# Patient Record
Sex: Male | Born: 1935 | Race: White | Hispanic: No | Marital: Married | Smoking: Former smoker
Health system: Southern US, Community
[De-identification: ages and names within clinical notes are randomized; demographics above are authoritative.]

## PROBLEM LIST (undated history)

## (undated) DIAGNOSIS — I739 Peripheral vascular disease, unspecified: Secondary | ICD-10-CM

## (undated) DIAGNOSIS — D649 Anemia, unspecified: Secondary | ICD-10-CM

## (undated) DIAGNOSIS — N400 Enlarged prostate without lower urinary tract symptoms: Secondary | ICD-10-CM

## (undated) DIAGNOSIS — C3490 Malignant neoplasm of unspecified part of unspecified bronchus or lung: Secondary | ICD-10-CM

## (undated) DIAGNOSIS — C801 Malignant (primary) neoplasm, unspecified: Secondary | ICD-10-CM

## (undated) DIAGNOSIS — I639 Cerebral infarction, unspecified: Secondary | ICD-10-CM

## (undated) DIAGNOSIS — K635 Polyp of colon: Secondary | ICD-10-CM

## (undated) DIAGNOSIS — K921 Melena: Secondary | ICD-10-CM

## (undated) DIAGNOSIS — I10 Essential (primary) hypertension: Secondary | ICD-10-CM

## (undated) DIAGNOSIS — L97509 Non-pressure chronic ulcer of other part of unspecified foot with unspecified severity: Secondary | ICD-10-CM

## (undated) DIAGNOSIS — M79671 Pain in right foot: Secondary | ICD-10-CM

## (undated) DIAGNOSIS — R195 Other fecal abnormalities: Secondary | ICD-10-CM

## (undated) DIAGNOSIS — I7025 Atherosclerosis of native arteries of other extremities with ulceration: Secondary | ICD-10-CM

## (undated) HISTORY — DX: Non-pressure chronic ulcer of other part of unspecified foot with unspecified severity: L97.509

## (undated) HISTORY — DX: Peripheral vascular disease, unspecified: I73.9

## (undated) HISTORY — DX: Malignant (primary) neoplasm, unspecified: C80.1

## (undated) HISTORY — DX: Melena: K92.1

## (undated) HISTORY — DX: Malignant neoplasm of unspecified part of unspecified bronchus or lung: C34.90

## (undated) HISTORY — DX: Atherosclerosis of native arteries of other extremities with ulceration: I70.25

## (undated) HISTORY — DX: Benign prostatic hyperplasia without lower urinary tract symptoms: N40.0

## (undated) HISTORY — DX: Pain in right foot: M79.671

## (undated) HISTORY — DX: Essential (primary) hypertension: I10

---
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
## 2000-03-07 ENCOUNTER — Ambulatory Visit (HOSPITAL_COMMUNITY): Admission: RE | Admit: 2000-03-07 | Discharge: 2000-03-07 | Payer: Self-pay | Admitting: Gastroenterology

## 2003-08-31 ENCOUNTER — Ambulatory Visit (HOSPITAL_COMMUNITY): Admission: RE | Admit: 2003-08-31 | Discharge: 2003-08-31 | Payer: Self-pay | Admitting: Gastroenterology

## 2006-12-16 ENCOUNTER — Ambulatory Visit: Payer: Self-pay | Admitting: Family Medicine

## 2006-12-30 ENCOUNTER — Ambulatory Visit: Payer: Self-pay | Admitting: Family Medicine

## 2014-03-24 DIAGNOSIS — N400 Enlarged prostate without lower urinary tract symptoms: Secondary | ICD-10-CM | POA: Insufficient documentation

## 2014-03-24 DIAGNOSIS — I10 Essential (primary) hypertension: Secondary | ICD-10-CM | POA: Insufficient documentation

## 2015-12-01 ENCOUNTER — Ambulatory Visit (INDEPENDENT_AMBULATORY_CARE_PROVIDER_SITE_OTHER): Payer: Commercial Managed Care - HMO | Admitting: Osteopathic Medicine

## 2015-12-01 ENCOUNTER — Encounter: Payer: Self-pay | Admitting: Osteopathic Medicine

## 2015-12-01 ENCOUNTER — Ambulatory Visit (INDEPENDENT_AMBULATORY_CARE_PROVIDER_SITE_OTHER): Payer: Commercial Managed Care - HMO

## 2015-12-01 VITALS — BP 131/52 | HR 51 | Ht 68.0 in | Wt 183.0 lb

## 2015-12-01 DIAGNOSIS — M79671 Pain in right foot: Secondary | ICD-10-CM

## 2015-12-01 NOTE — Patient Instructions (Signed)
Try to keep the foot elevated as much as possible to help with swelling and pain. Ok to take Tylenol, Aspirin or Ibuprofen for the pain if prescription medicines aren't helping you. We will work on scheduling an MRI to get a better picture of the foot, and further workup will be decided once we have more information from the MRI.   Please make an appointment to see Dr Sheppard Coil after the MRI so she can check your foot again and go over the MRI results and images with you. Please wear the protective boot as much as possible when you are walking.   If the foot becomes more swollen or severely painful, if you develop fever or weakness, or if the skin start to look worse, let a doctor know right away. It is unlikely that you have an infection at this time but keep a close watch on your skin for redness or drainage.

## 2015-12-01 NOTE — Addendum Note (Signed)
Addended by: Maryla Morrow on: 12/01/2015 04:51 PM   Modules accepted: Orders

## 2015-12-01 NOTE — Progress Notes (Signed)
HPI: Henry Ford is a 81 y.o. male who presents to [facility] today for chief complaint of:  Chief Complaint  Patient presents with  . Establish Care  . Foot Swelling    RIGHT    . Location: R foot . Quality: painful, soreness . Duration: few weeks . Context: was on a ladder and thinks he sprained it, didn't fall, he was reaching for something and felt a pull in the foot. Thinks may have been a puncture wound. ER XR pt states was normal. Thinks got tetanus shot. No records available.  . Assoc signs/symptoms: No history claudication. No fever/chills.    Past medical, social and family history reviewed: Past Medical History  Diagnosis Date  . Hypertension    No past surgical history on file. Social History  Substance Use Topics  . Smoking status: Current Every Day Smoker  . Smokeless tobacco: Not on file  . Alcohol Use: Not on file   No family history on file.  Current Outpatient Prescriptions  Medication Sig Dispense Refill  . finasteride (PROSCAR) 5 MG tablet Take 5 mg by mouth.    . losartan (COZAAR) 100 MG tablet Take 100 mg by mouth.    . terazosin (HYTRIN) 10 MG capsule Take 10 mg by mouth.     No current facility-administered medications for this visit.   No Known Allergies    Review of Systems: CONSTITUTIONAL:  No  fever, no chills, No  unintentional weight changes HEAD/EYES/EARS/NOSE/THROAT: No  headache, no vision change, no hearing change, No  sore throat, No  sinus pressure CARDIAC: No  chest pain, No  pressure, No palpitations, No  orthopnea RESPIRATORY: No  cough, No  shortness of breath/wheeze GASTROINTESTINAL: No  nausea, No  vomiting, No  abdominal pain, No  blood in stool, No  diarrhea, No  constipation  MUSCULOSKELETAL: (+) foot myalgia/arthralgia GENITOURINARY: No  incontinence, No  abnormal genital bleeding/discharge SKIN: No  rash/wounds/concerning lesions other than R foot HEM/ONC: No  easy bruising/bleeding,  No  abnormal lymph node ENDOCRINE: No polyuria/polydipsia/polyphagia, No  heat/cold intolerance  NEUROLOGIC: No  weakness, No  dizziness, No  slurred speech PSYCHIATRIC: No  concerns with depression, No  concerns with anxiety, No sleep problems  Exam:  BP 131/52 mmHg  Pulse 51  Ht '5\' 8"'$  (1.727 m)  Wt 183 lb (83.008 kg)  BMI 27.83 kg/m2 Constitutional: VS see above. General Appearance: alert, well-developed, well-nourished, NAD Respiratory: Normal respiratory effort. no wheeze, no rhonchi, no rales Cardiovascular: S1/S2 normal, no murmur, no rub/gallop auscultated. RRR.  Musculoskeletal: Gait antalgic. R foot (+)2-3 edema on foot, (+)1 up to just distal to knee. Foot (+)mild erythema, normal capillary refill, (+) onychomycosis, (+) contusion R great toe, monofilament exam normal, pulses difficult to palpate due to edema but skin feels normal temperature and sensatoin is intact, cap refill <2sec on R toe Skin: warm, dry, intact. No rash/ulcer. No concerning nevi or subq nodules on limited exam.  (+)crackin of skin d/t foot edema, no drainage/ulcer, no wound noted   No results found for this or any previous visit (from the past 72 hour(s)).  Records reviewed: 03/2014 CBC, CMP, TSH normal, no lipids checked   ASSESSMENT/PLAN: Advised will know more once have MRI, my concern is for possible osteomyelitis or occult fracture. Foot does not appear cellulitic at this time and I don't think risk/benefit profile of abx is work prescribing them at this time, pt advised po abx won't work on OM either. Pt  advised schedule appt with me after MRI to discuss results and further workup if needed. Pt given postop shoe. Advised elevate foot if he can tolerate doing this. Opiate pain meds weren't really helping, I advised he can continue these or go to NSAID if he'd rather. ER precautions reviewed or RTC sooner if needed. Note: pt was also seen briefly by Dr Georgina Snell sports med, whose assistance was appreciated. XR  reviewed with Dr. Georgina Snell and does not appear to be fracture, await official read. Get ER records.   Right foot pain - Plan: DG Foot Complete Right, MR Foot Right Wo Contrast   Return appointment after MRI so we can view results.

## 2015-12-02 ENCOUNTER — Telehealth: Payer: Self-pay

## 2015-12-02 DIAGNOSIS — N400 Enlarged prostate without lower urinary tract symptoms: Secondary | ICD-10-CM

## 2015-12-02 DIAGNOSIS — M79671 Pain in right foot: Secondary | ICD-10-CM

## 2015-12-02 NOTE — Telephone Encounter (Signed)
Patient request a referral for neurologist Dr. Irine Seal with [facility] in [facility] near [facility] for Prostate problems. He have seen Dr. Jeffie Pollock for over 10 years now. Also per  Bonnita Nasuti in imaging patient need to have BMET panel done for MRI due to patient being over 60 and getting IV contrast. Lab and referral has been place.Elysabeth Aust,CMA

## 2015-12-06 ENCOUNTER — Telehealth: Payer: Self-pay

## 2015-12-06 DIAGNOSIS — M25579 Pain in unspecified ankle and joints of unspecified foot: Secondary | ICD-10-CM

## 2015-12-06 NOTE — Telephone Encounter (Signed)
Patient spouse called stated that patient that his toe is still swollen and it keeps him up at night, he wants to know if he can get something for pain that may help him sleep. Please advise. Rhonda Cunningham,CMA

## 2015-12-06 NOTE — Telephone Encounter (Signed)
He should have Hydrocodone 10-325 mg pills, he can take these (or if he has Hydrocodone 5-325 he can take two of these pills) at least 1 hour before bedtime.

## 2015-12-07 MED ORDER — GABAPENTIN 300 MG PO CAPS: 300.0000 mg | ORAL_CAPSULE | Freq: Two times a day (BID) | ORAL | Status: DC

## 2015-12-07 NOTE — Telephone Encounter (Signed)
See encounter

## 2015-12-07 NOTE — Telephone Encounter (Signed)
Please call patient, if the hydrocodone isn't working, not much stronger we could give. I tried alternative medication called into pharmacy gabapentin, this may make him a bit sleepy so try taking at night. Please confirm that he is set up for MRI soon.

## 2015-12-07 NOTE — Telephone Encounter (Signed)
Patient has taken the hydrocodone that was given at [facility] and that does not work she stated. Henry Ford,CMA

## 2015-12-07 NOTE — Telephone Encounter (Signed)
Patient is informed and set to get blood work done 12/07/14 and at that point MRI will be scheduled. [facility],CMA

## 2015-12-09 LAB — BASIC METABOLIC PANEL
BUN: 23 mg/dL (ref 7–25)
CHLORIDE: 109 mmol/L (ref 98–110)
CO2: 28 mmol/L (ref 20–31)
Calcium: 8.7 mg/dL (ref 8.6–10.3)
Creat: 1.13 mg/dL (ref 0.70–1.18)
Glucose, Bld: 82 mg/dL (ref 65–99)
POTASSIUM: 4.5 mmol/L (ref 3.5–5.3)
Sodium: 142 mmol/L (ref 135–146)

## 2015-12-15 ENCOUNTER — Encounter: Payer: Self-pay | Admitting: Vascular Surgery

## 2015-12-15 ENCOUNTER — Other Ambulatory Visit: Payer: Self-pay

## 2015-12-15 DIAGNOSIS — I739 Peripheral vascular disease, unspecified: Secondary | ICD-10-CM

## 2015-12-19 ENCOUNTER — Telehealth: Payer: Self-pay | Admitting: Osteopathic Medicine

## 2015-12-19 NOTE — Telephone Encounter (Signed)
I received notification that the patient had been admitted to [facility], however I do not have complete records. Insurance is also asking [place] about referral for vein and vascular specialty. To complete the referral, I need to see this patient in the office for hospital follow-up visit, I need records from his hospital stay. Please ask him to schedule a hospital follow-up visit, if he is able to come in a few days prior to his visit to sign a records release so that we will have his records from his hospital stay, that would be ideal.

## 2015-12-19 NOTE — Telephone Encounter (Signed)
PATIENT INFORMED. Rhonda Cunningham,CMA

## 2015-12-20 ENCOUNTER — Ambulatory Visit (INDEPENDENT_AMBULATORY_CARE_PROVIDER_SITE_OTHER): Payer: Commercial Managed Care - HMO | Admitting: Osteopathic Medicine

## 2015-12-20 ENCOUNTER — Encounter: Payer: Self-pay | Admitting: Osteopathic Medicine

## 2015-12-20 VITALS — BP 128/55 | HR 65 | Wt 170.0 lb

## 2015-12-20 DIAGNOSIS — L03115 Cellulitis of right lower limb: Secondary | ICD-10-CM

## 2015-12-20 DIAGNOSIS — M79671 Pain in right foot: Secondary | ICD-10-CM | POA: Diagnosis not present

## 2015-12-20 NOTE — Progress Notes (Signed)
HPI: Henry Ford is a 81 y.o. male who presents to [facility] today for chief complaint of:  Chief Complaint  Patient presents with  . Hospitalization Follow-up     Was in the hospital 12/11/15 - 12/14/15, no discharge summary available at this time but I called [facility] and will fax request. Patient and his wife do bring their discharge instructions, but states that he did have CT of the foot and MRI of the foot however no results are available, there is no diagnosis on his discharge instructions for osteomyelitis but he was diagnosed with cellulitis and given antibiotics and pain medication. States this it was doing better in the hospital when he was keeping it consistently elevated however he has not been doing this is much at home, swelling has gotten a little bit worse, overall though no major difference from last visit. No fever or chills, no redness extending up the limb ominous sensation intact   Past medical, social and family history reviewed: Past Medical History  Diagnosis Date  . Hypertension   . Prostate enlargement    No past surgical history on file. Social History  Substance Use Topics  . Smoking status: Light Tobacco Smoker  . Smokeless tobacco: Never Used  . Alcohol Use: 0.6 oz/week    0 Standard drinks or equivalent, 1 Cans of beer per week   No family history on file.  Current Outpatient Prescriptions  Medication Sig Dispense Refill  . finasteride (PROSCAR) 5 MG tablet Take 5 mg by mouth.    . gabapentin (NEURONTIN) 300 MG capsule Take 1 capsule (300 mg total) by mouth 2 (two) times daily. 30 capsule 1  . HYDROcodone-acetaminophen (NORCO/VICODIN) 5-325 MG tablet Take 1 tablet by mouth every 6 (six) hours as needed for moderate pain.    Marland Kitchen losartan (COZAAR) 100 MG tablet Take 100 mg by mouth.    . sulfamethoxazole-trimethoprim (BACTRIM DS,SEPTRA DS) 800-160 MG tablet Take 1 tablet by mouth 2 (two) times daily.    Marland Kitchen  terazosin (HYTRIN) 10 MG capsule Take 10 mg by mouth.     No current facility-administered medications for this visit.   No Known Allergies    Review of Systems: CONSTITUTIONAL:  No  fever, no chills, No  unintentional weight changes CARDIAC: No  chest pain, No  pressure, No palpitations, No  orthopnea RESPIRATORY: No  cough, No  shortness of breath/wheeze GASTROINTESTINAL: No  nausea, No  vomiting, No  abdominal pain,  MUSCULOSKELETAL: (+) myalgia/arthralgia as per history of present illness SKIN: No  rash/wounds/concerning lesions other than right foot, swelling, HEM/ONC: No  easy bruising/bleeding, No  abnormal lymph node   Exam:  BP 128/55 mmHg  Pulse 65  Wt 170 lb (77.111 kg) Constitutional: VS see above. General Appearance: alert, well-developed, well-nourished, NAD Eyes: Normal lids and conjunctive, non-icteric sclera,  Ears, Nose, Mouth, Throat: MMM, Normal external inspection ears/nares/mouth/lips/gums Respiratory: Normal respiratory effort. Musculoskeletal: Gait antalgic. R foot (+)2-3 edema on foot, (+)1 up to just distal to knee. Foot (+)mild erythema right great toe, normal capillary refill, (+) onychomycosis, (+) contusion R great toe and her aspect appears somewhat more resolved and it did on initial exam, pulses difficult to palpate due to edema but skin feels normal temperature and sensatoin is intact, cap refill <2sec on R toe Neurological: No cranial nerve deficit on limited exam. Motor and sensation intact and symmetric Skin: warm, dry, intact. No rash/ulcer. No concerning nevi or subq nodules on limited exam.  Mild erythema right great toe, slightly worse than last exam  no ulceration, no fluctuance    ASSESSMENT/PLAN: Referrals placed for general surgery and vein and vascular to that these will be covered by insurance, though I'm still unclear why exactly he is following up with the specialists since I do not have a discharge summary available to me right now. We  called [facility] to obtain these records, patient has signed a release and we have faxed it. Patient instructed to call me if he is running low on pain medications and thinks he needs more. Follow-up as directed, appointment tomorrow with general surgery, appointment in one week within and vascular. Patient is noncompliant with surgical shoe  Right foot pain - Plan: Ambulatory referral to General Surgery, Ambulatory referral to Vascular Surgery  Cellulitis of right foot   Return in about 2 weeks (around 01/03/2016), or sooner if symptoms worsen or you have any other concerns.

## 2015-12-20 NOTE — Patient Instructions (Signed)
Take all antibiotics as directed, keep follow up appointments as directed. We have sent records request to [facility] to get your records to me so I can review them and make sure everything is in place for you to get the best care. Please make sure your other specialists forward your records to my office. Please let me know if there is anything else we can do for you! I'd like to see you back in the next 2 weeks so we can follow up on your progress and address any other concerns you may have, but please make an appointment sooner if needed!

## 2015-12-21 DIAGNOSIS — R195 Other fecal abnormalities: Secondary | ICD-10-CM

## 2015-12-21 DIAGNOSIS — I739 Peripheral vascular disease, unspecified: Secondary | ICD-10-CM

## 2015-12-21 DIAGNOSIS — D649 Anemia, unspecified: Secondary | ICD-10-CM

## 2015-12-21 HISTORY — DX: Peripheral vascular disease, unspecified: I73.9

## 2015-12-21 HISTORY — DX: Anemia, unspecified: D64.9

## 2015-12-21 HISTORY — DX: Other fecal abnormalities: R19.5

## 2015-12-23 ENCOUNTER — Encounter: Payer: Self-pay | Admitting: Vascular Surgery

## 2015-12-28 ENCOUNTER — Ambulatory Visit (INDEPENDENT_AMBULATORY_CARE_PROVIDER_SITE_OTHER): Payer: Commercial Managed Care - HMO | Admitting: Vascular Surgery

## 2015-12-28 ENCOUNTER — Encounter: Payer: Self-pay | Admitting: Vascular Surgery

## 2015-12-28 ENCOUNTER — Ambulatory Visit (HOSPITAL_COMMUNITY)
Admission: RE | Admit: 2015-12-28 | Discharge: 2015-12-28 | Disposition: A | Discharge status: Home / Self Care | Payer: Commercial Managed Care - HMO | Source: Ambulatory Visit | Attending: Vascular Surgery | Admitting: Vascular Surgery

## 2015-12-28 VITALS — BP 97/64 | HR 81 | Temp 97.1°F | Resp 18 | Ht 68.0 in | Wt 160.2 lb

## 2015-12-28 DIAGNOSIS — I739 Peripheral vascular disease, unspecified: Secondary | ICD-10-CM | POA: Diagnosis not present

## 2015-12-28 DIAGNOSIS — I10 Essential (primary) hypertension: Secondary | ICD-10-CM | POA: Insufficient documentation

## 2015-12-28 DIAGNOSIS — R0989 Other specified symptoms and signs involving the circulatory and respiratory systems: Secondary | ICD-10-CM | POA: Diagnosis present

## 2015-12-28 MED ORDER — LEVOFLOXACIN 500 MG PO TABS: 500.0000 mg | ORAL_TABLET | Freq: Every day | ORAL | Status: DC

## 2015-12-29 ENCOUNTER — Other Ambulatory Visit: Payer: Self-pay

## 2015-12-29 NOTE — Progress Notes (Signed)
[facility] HISTORY AND PHYSICAL   Referring Physician: Dr Karlyn Agee History of Present Illness:  Patient is a 81 y.o. male who presents for evaluation of a nonhealing wound on his right first toe. The patient developed a cellulitis in his foot after an accident with a ladder in early January. He was treated with IV antibiotics in the hospital in late January. The toe has failed to heal. He has been on Bactrim for the last 2 weeks but complains that it upsets his stomach. He has had pain in the right first toe since his accident in early December. He currently smokes one quarter pack of cigarettes per day. Greater than 3 minutes today were spent regarding smoking cessation counseling. He has continuous pain in the right foot. His awakes him from sleep at nighttime.  Other chronic medical problems include hypertension enlarged prostate both of which are been stable. He is on aspirin daily. He takes Percocet currently for pain.  Past Medical History  Diagnosis Date  . Hypertension   . Prostate enlargement   . Prostate enlargement   . Peripheral vascular disease [facility])     Past Surgical History  Procedure Laterality Date  . Hydrocele excision  1956    Social History Social History  Substance Use Topics  . Smoking status: Light Tobacco Smoker -- 0.25 packs/day    Types: Cigarettes  . Smokeless tobacco: Never Used  . Alcohol Use: 0.6 oz/week    1 Cans of beer, 0 Standard drinks or equivalent per week    Family History History reviewed. No pertinent family history.  Allergies  No Known Allergies   Current Outpatient Prescriptions  Medication Sig Dispense Refill  . finasteride (PROSCAR) 5 MG tablet Take 5 mg by mouth daily.     Marland Kitchen losartan (COZAAR) 100 MG tablet Take 100 mg by mouth daily.     Marland Kitchen terazosin (HYTRIN) 10 MG capsule Take 10 mg by mouth at bedtime.     Marland Kitchen aspirin EC 81 MG tablet Take 81 mg by mouth daily.    Marland Kitchen gabapentin (NEURONTIN) 300  MG capsule Take 1 capsule (300 mg total) by mouth 2 (two) times daily. (Patient not taking: Reported on 12/28/2015) 30 capsule 1  . levofloxacin (LEVAQUIN) 500 MG tablet Take 1 tablet (500 mg total) by mouth daily. 14 tablet 0  . oxyCODONE-acetaminophen (PERCOCET/ROXICET) 5-325 MG tablet Take 1 tablet by mouth every 6 (six) hours as needed for moderate pain.      No current facility-administered medications for this visit.    ROS:   General:  No weight loss, Fever, chills  HEENT: No recent headaches, no nasal bleeding, no visual changes, no sore throat  Neurologic: No dizziness, blackouts, seizures. No recent symptoms of stroke or mini- stroke. No recent episodes of slurred speech, or temporary blindness.  Cardiac: No recent episodes of chest pain/pressure, no shortness of breath at rest.  No shortness of breath with exertion.  Denies history of atrial fibrillation or irregular heartbeat  Vascular: + history of rest pain in feet.  No history of claudication.  No history of non-healing ulcer, No history of DVT   Pulmonary: No home oxygen, no productive cough, no hemoptysis,  No asthma or wheezing  Musculoskeletal:  '[ ]'$  Arthritis, '[ ]'$  Low back pain,  '[ ]'$  Joint pain  Hematologic:No history of hypercoagulable state.  No history of easy bleeding.  No history of anemia  Gastrointestinal: No hematochezia or melena,  No gastroesophageal reflux,  no trouble swallowing  Urinary: '[ ]'$  chronic Kidney disease, '[ ]'$  on HD - '[ ]'$  MWF or '[ ]'$  TTHS, '[ ]'$  Burning with urination, '[ ]'$  Frequent urination, '[ ]'$  Difficulty urinating;   Skin: No rashes  Psychological: No history of anxiety,  No history of depression   Physical Examination  Filed Vitals:   12/28/15 1543  BP: 97/64  Pulse: 81  Temp: 97.1 F (36.2 C)  TempSrc: Oral  Resp: 18  Height: '5\' 8"'$  (1.727 m)  Weight: 160 lb 3.2 oz (72.666 kg)  SpO2: 98%    Body mass index is 24.36 kg/(m^2).  General:  Alert and oriented, no acute  distress HEENT: Normal Neck: No bruit or JVD Pulmonary: Clear to auscultation bilaterally Cardiac: Regular Rate and Rhythm without murmur Abdomen: Soft, non-tender, non-distended, no mass, no scars Skin: No rash, right first toe dusky at the tip Extremity Pulses:  2+ radial, brachial 1+, femoral, absent popliteal dorsalis pedis, posterior tibial pulses bilaterally Musculoskeletal: No deformity or edema  Neurologic: Upper and lower extremity motor 5/5 and symmetric  DATA:  I reviewed the patient's recent ABIs which were 0.6 on the right 0.9 on the left these were performed at [facility] on 12/12/2015. The patient had arterial duplex of the right leg today which showed monophasic waveforms common femoral down suggesting aortoiliac occlusive disease and superficial femoral artery occlusive disease. He did have biphasic waveforms on the left side.   ASSESSMENT:  Assessment: Nonhealing wound right first toe most likely secondary to aortoiliac possibly SFA occlusive disease as well.   PLAN:  Aortogram bilateral lower extremity runoff possible angioplasty or stenting, partner Dr. Scot Dock on Monday, February 13. Risks benefits possible palpitations and procedure details were discussed with the patient today including not limited to bleeding infection vessel injury contrast reaction renal dysfunction. He understands and agrees to proceed.  Ruta Hinds, MD [facility] Office: [phone] Pager: [phone]

## 2015-12-30 ENCOUNTER — Encounter: Payer: Self-pay | Admitting: Osteopathic Medicine

## 2015-12-30 DIAGNOSIS — M79671 Pain in right foot: Secondary | ICD-10-CM

## 2015-12-30 HISTORY — DX: Pain in right foot: M79.671

## 2016-01-02 ENCOUNTER — Encounter (HOSPITAL_COMMUNITY)
Admission: RE | Disposition: A | Discharge status: Home / Self Care | Payer: Self-pay | Source: Ambulatory Visit | Attending: Vascular Surgery

## 2016-01-02 ENCOUNTER — Encounter: Payer: Self-pay | Admitting: Surgery

## 2016-01-02 ENCOUNTER — Encounter (HOSPITAL_COMMUNITY): Payer: Self-pay | Admitting: Vascular Surgery

## 2016-01-02 ENCOUNTER — Ambulatory Visit (HOSPITAL_COMMUNITY)
Admission: RE | Admit: 2016-01-02 | Discharge: 2016-01-03 | Disposition: A | Discharge status: Home / Self Care | Payer: Commercial Managed Care - HMO | Source: Ambulatory Visit | Attending: Vascular Surgery | Admitting: Vascular Surgery

## 2016-01-02 DIAGNOSIS — M79671 Pain in right foot: Secondary | ICD-10-CM | POA: Insufficient documentation

## 2016-01-02 DIAGNOSIS — I10 Essential (primary) hypertension: Secondary | ICD-10-CM | POA: Diagnosis not present

## 2016-01-02 DIAGNOSIS — I70235 Atherosclerosis of native arteries of right leg with ulceration of other part of foot: Secondary | ICD-10-CM | POA: Diagnosis present

## 2016-01-02 DIAGNOSIS — I70261 Atherosclerosis of native arteries of extremities with gangrene, right leg: Secondary | ICD-10-CM | POA: Diagnosis not present

## 2016-01-02 DIAGNOSIS — N400 Enlarged prostate without lower urinary tract symptoms: Secondary | ICD-10-CM | POA: Insufficient documentation

## 2016-01-02 DIAGNOSIS — I70201 Unspecified atherosclerosis of native arteries of extremities, right leg: Secondary | ICD-10-CM | POA: Diagnosis not present

## 2016-01-02 DIAGNOSIS — F17210 Nicotine dependence, cigarettes, uncomplicated: Secondary | ICD-10-CM | POA: Insufficient documentation

## 2016-01-02 DIAGNOSIS — Z7982 Long term (current) use of aspirin: Secondary | ICD-10-CM | POA: Insufficient documentation

## 2016-01-02 DIAGNOSIS — I7025 Atherosclerosis of native arteries of other extremities with ulceration: Secondary | ICD-10-CM

## 2016-01-02 DIAGNOSIS — L97519 Non-pressure chronic ulcer of other part of right foot with unspecified severity: Secondary | ICD-10-CM | POA: Insufficient documentation

## 2016-01-02 HISTORY — PX: PERIPHERAL VASCULAR CATHETERIZATION: SHX172C

## 2016-01-02 LAB — BASIC METABOLIC PANEL
ANION GAP: 16 — AB (ref 5–15)
BUN: 89 mg/dL — ABNORMAL HIGH (ref 6–20)
CHLORIDE: 102 mmol/L (ref 101–111)
CO2: 17 mmol/L — AB (ref 22–32)
Calcium: 8.6 mg/dL — ABNORMAL LOW (ref 8.9–10.3)
Creatinine, Ser: 4.01 mg/dL — ABNORMAL HIGH (ref 0.61–1.24)
GFR calc Af Amer: 15 mL/min — ABNORMAL LOW (ref >60)
GFR calc non Af Amer: 13 mL/min — ABNORMAL LOW (ref >60)
GLUCOSE: 88 mg/dL (ref 65–99)
POTASSIUM: 4.9 mmol/L (ref 3.5–5.1)
Sodium: 135 mmol/L (ref 135–145)

## 2016-01-02 LAB — POCT I-STAT, CHEM 8
BUN: 91 mg/dL — AB (ref 6–20)
CREATININE: 3.9 mg/dL — AB (ref 0.61–1.24)
Calcium, Ion: 1.11 mmol/L — ABNORMAL LOW (ref 1.13–1.30)
Chloride: 104 mmol/L (ref 101–111)
Glucose, Bld: 88 mg/dL (ref 65–99)
HEMATOCRIT: 32 % — AB (ref 39.0–52.0)
Hemoglobin: 10.9 g/dL — ABNORMAL LOW (ref 13.0–17.0)
POTASSIUM: 4.9 mmol/L (ref 3.5–5.1)
Sodium: 134 mmol/L — ABNORMAL LOW (ref 135–145)
TCO2: 16 mmol/L (ref 0–100)

## 2016-01-02 LAB — POCT ACTIVATED CLOTTING TIME
ACTIVATED CLOTTING TIME: 193 s
ACTIVATED CLOTTING TIME: 204 s
Activated Clotting Time: 183 seconds

## 2016-01-02 SURGERY — ABDOMINAL AORTOGRAM W/LOWER EXTREMITY

## 2016-01-02 MED ORDER — SODIUM CHLORIDE 0.9 % IV SOLN
INTRAVENOUS | Status: DC
Start: 2016-01-02 — End: 2016-01-03
  Administered 2016-01-03: 06:00:00 via INTRAVENOUS

## 2016-01-02 MED ORDER — LOSARTAN POTASSIUM 50 MG PO TABS: 100.0000 mg | ORAL_TABLET | Freq: Every day | ORAL | Status: DC

## 2016-01-02 MED ORDER — ONDANSETRON HCL 4 MG/2ML IJ SOLN: 4.0000 mg | Freq: Four times a day (QID) | INTRAMUSCULAR | Status: DC | PRN

## 2016-01-02 MED ORDER — HEPARIN (PORCINE) IN NACL 2-0.9 UNIT/ML-% IJ SOLN
INTRAMUSCULAR | Status: DC | PRN
  Administered 2016-01-02: 08:00:00

## 2016-01-02 MED ORDER — CLOPIDOGREL BISULFATE 75 MG PO TABS
75.0000 mg | ORAL_TABLET | Freq: Every day | ORAL | Status: DC
  Administered 2016-01-02 – 2016-01-03 (×2): 75 mg via ORAL
  Filled 2016-01-02 (×2): qty 1

## 2016-01-02 MED ORDER — SODIUM CHLORIDE 0.9 % IV SOLN
INTRAVENOUS | Status: DC
  Administered 2016-01-02: 07:00:00 via INTRAVENOUS

## 2016-01-02 MED ORDER — FINASTERIDE 5 MG PO TABS
5.0000 mg | ORAL_TABLET | Freq: Every day | ORAL | Status: DC
  Administered 2016-01-02 – 2016-01-03 (×2): 5 mg via ORAL
  Filled 2016-01-02 (×2): qty 1

## 2016-01-02 MED ORDER — MIDAZOLAM HCL 2 MG/2ML IJ SOLN
INTRAMUSCULAR | Status: AC
  Filled 2016-01-02: qty 2

## 2016-01-02 MED ORDER — HEPARIN SODIUM (PORCINE) 1000 UNIT/ML IJ SOLN
INTRAMUSCULAR | Status: DC | PRN
  Administered 2016-01-02: 5000 [IU] via INTRAVENOUS

## 2016-01-02 MED ORDER — TERAZOSIN HCL 5 MG PO CAPS
10.0000 mg | ORAL_CAPSULE | Freq: Every day | ORAL | Status: DC
  Administered 2016-01-02: 10 mg via ORAL
  Filled 2016-01-02: qty 2

## 2016-01-02 MED ORDER — SODIUM CHLORIDE 0.9 % IV SOLN: 1.0000 mL/kg/h | INTRAVENOUS | Status: AC

## 2016-01-02 MED ORDER — OXYCODONE-ACETAMINOPHEN 5-325 MG PO TABS
1.0000 | ORAL_TABLET | ORAL | Status: DC | PRN
  Administered 2016-01-02 (×3): 1 via ORAL
  Filled 2016-01-02 (×3): qty 1

## 2016-01-02 MED ORDER — LIDOCAINE HCL (PF) 1 % IJ SOLN
INTRAMUSCULAR | Status: DC | PRN
  Administered 2016-01-02: 8 mL

## 2016-01-02 MED ORDER — LEVOFLOXACIN 500 MG PO TABS
500.0000 mg | ORAL_TABLET | Freq: Every day | ORAL | Status: DC
  Administered 2016-01-02 – 2016-01-03 (×2): 500 mg via ORAL
  Filled 2016-01-02 (×2): qty 1

## 2016-01-02 MED ORDER — LIDOCAINE HCL (PF) 1 % IJ SOLN
INTRAMUSCULAR | Status: AC
  Filled 2016-01-02: qty 30

## 2016-01-02 MED ORDER — FENTANYL CITRATE (PF) 100 MCG/2ML IJ SOLN
INTRAMUSCULAR | Status: AC
  Filled 2016-01-02: qty 2

## 2016-01-02 MED ORDER — MIDAZOLAM HCL 2 MG/2ML IJ SOLN
INTRAMUSCULAR | Status: DC | PRN
  Administered 2016-01-02: 1 mg via INTRAVENOUS

## 2016-01-02 MED ORDER — FENTANYL CITRATE (PF) 100 MCG/2ML IJ SOLN
INTRAMUSCULAR | Status: DC | PRN
  Administered 2016-01-02: 50 ug via INTRAVENOUS
  Administered 2016-01-02: 25 ug via INTRAVENOUS

## 2016-01-02 MED ORDER — HEPARIN SODIUM (PORCINE) 1000 UNIT/ML IJ SOLN
INTRAMUSCULAR | Status: AC
  Filled 2016-01-02: qty 1

## 2016-01-02 MED ORDER — ACETAMINOPHEN 325 MG PO TABS: 650.0000 mg | ORAL_TABLET | ORAL | Status: DC | PRN

## 2016-01-02 MED ORDER — ASPIRIN EC 81 MG PO TBEC
81.0000 mg | DELAYED_RELEASE_TABLET | Freq: Every day | ORAL | Status: DC
  Administered 2016-01-03: 81 mg via ORAL
  Filled 2016-01-02: qty 1

## 2016-01-02 MED ORDER — HEPARIN (PORCINE) IN NACL 2-0.9 UNIT/ML-% IJ SOLN
INTRAMUSCULAR | Status: AC
  Filled 2016-01-02: qty 1000

## 2016-01-02 MED ORDER — OXYCODONE-ACETAMINOPHEN 5-325 MG PO TABS: 1.0000 | ORAL_TABLET | Freq: Four times a day (QID) | ORAL | Status: DC | PRN

## 2016-01-02 SURGICAL SUPPLY — 18 items
CATH ANGIO 5F PIGTAIL 65CM (CATHETERS) ×4 IMPLANT
CATH STRAIGHT 5FR 65CM (CATHETERS) ×4 IMPLANT
COVER PRB 48X5XTLSCP FOLD TPE (BAG) ×2 IMPLANT
COVER PROBE 5X48 (BAG) ×2
FILTER CO2 0.2 MICRON (VASCULAR PRODUCTS) ×4 IMPLANT
KIT ENCORE 26 ADVANTAGE (KITS) ×4 IMPLANT
KIT MICROINTRODUCER STIFF 5F (SHEATH) ×4 IMPLANT
KIT PV (KITS) ×4 IMPLANT
RESERVOIR CO2 (VASCULAR PRODUCTS) ×4 IMPLANT
SET FLUSH CO2 (MISCELLANEOUS) ×4 IMPLANT
SHEATH BRITE TIP 6FR 35CM (SHEATH) ×4 IMPLANT
SHEATH PINNACLE 5F 10CM (SHEATH) ×4 IMPLANT
STENT EXPRESS LD 7X27X75 (Permanent Stent) ×4 IMPLANT
SYR MEDRAD MARK V 150ML (SYRINGE) IMPLANT
TAPE RADIOPAQUE TURBO (MISCELLANEOUS) ×4 IMPLANT
TRANSDUCER W/STOPCOCK (MISCELLANEOUS) ×4 IMPLANT
TRAY PV CATH (CUSTOM PROCEDURE TRAY) ×4 IMPLANT
WIRE HITORQ VERSACORE ST 145CM (WIRE) ×4 IMPLANT

## 2016-01-02 NOTE — Interval H&P Note (Signed)
History and Physical Interval Note:  01/02/2016 7:41 AM  Henry Ford  has presented today for surgery, with the diagnosis of pvd w/right great toe ulcer  The various methods of treatment have been discussed with the patient and family. After consideration of risks, benefits and other options for treatment, the patient has consented to  Procedure(s): Abdominal Aortogram w/Lower Extremity (N/A) as a surgical intervention .  The patient's history has been reviewed, patient examined, no change in status, stable for surgery.  I have reviewed the patient's chart and labs.  Questions were answered to the patient's satisfaction.     Deitra Mayo

## 2016-01-02 NOTE — H&P (View-Only) (Signed)
[facility] [place] HISTORY AND PHYSICAL   Referring Physician: Dr Karlyn Agee History of Present Illness:  Patient is a 81 y.o. male who presents for evaluation of a nonhealing wound on his right first toe. The patient developed a cellulitis in his foot after an accident with a ladder in early January. He was treated with IV antibiotics in the hospital in late January. The toe has failed to heal. He has been on Bactrim for the last 2 weeks but complains that it upsets his stomach. He has had pain in the right first toe since his accident in early December. He currently smokes one quarter pack of cigarettes per day. Greater than 3 minutes today were spent regarding smoking cessation counseling. He has continuous pain in the right foot. His awakes him from sleep at nighttime.  Other chronic medical problems include hypertension enlarged prostate both of which are been stable. He is on aspirin daily. He takes Percocet currently for pain.  Past Medical History  Diagnosis Date  . Hypertension   . Prostate enlargement   . Prostate enlargement   . Peripheral vascular disease [facility])     Past Surgical History  Procedure Laterality Date  . Hydrocele excision  1956    Social History Social History  Substance Use Topics  . Smoking status: Light Tobacco Smoker -- 0.25 packs/day    Types: Cigarettes  . Smokeless tobacco: Never Used  . Alcohol Use: 0.6 oz/week    1 Cans of beer, 0 Standard drinks or equivalent per week    Family History History reviewed. No pertinent family history.  Allergies  No Known Allergies   Current Outpatient Prescriptions  Medication Sig Dispense Refill  . finasteride (PROSCAR) 5 MG tablet Take 5 mg by mouth daily.     Marland Kitchen losartan (COZAAR) 100 MG tablet Take 100 mg by mouth daily.     Marland Kitchen terazosin (HYTRIN) 10 MG capsule Take 10 mg by mouth at bedtime.     Marland Kitchen aspirin EC 81 MG tablet Take 81 mg by mouth daily.    Marland Kitchen gabapentin (NEURONTIN) 300  MG capsule Take 1 capsule (300 mg total) by mouth 2 (two) times daily. (Patient not taking: Reported on 12/28/2015) 30 capsule 1  . levofloxacin (LEVAQUIN) 500 MG tablet Take 1 tablet (500 mg total) by mouth daily. 14 tablet 0  . oxyCODONE-acetaminophen (PERCOCET/ROXICET) 5-325 MG tablet Take 1 tablet by mouth every 6 (six) hours as needed for moderate pain.      No current facility-administered medications for this visit.    ROS:   General:  No weight loss, Fever, chills  HEENT: No recent headaches, no nasal bleeding, no visual changes, no sore throat  Neurologic: No dizziness, blackouts, seizures. No recent symptoms of stroke or mini- stroke. No recent episodes of slurred speech, or temporary blindness.  Cardiac: No recent episodes of chest pain/pressure, no shortness of breath at rest.  No shortness of breath with exertion.  Denies history of atrial fibrillation or irregular heartbeat  Vascular: + history of rest pain in feet.  No history of claudication.  No history of non-healing ulcer, No history of DVT   Pulmonary: No home oxygen, no productive cough, no hemoptysis,  No asthma or wheezing  Musculoskeletal:  '[ ]'$  Arthritis, '[ ]'$  Low back pain,  '[ ]'$  Joint pain  Hematologic:No history of hypercoagulable state.  No history of easy bleeding.  No history of anemia  Gastrointestinal: No hematochezia or melena,  No gastroesophageal reflux,  no trouble swallowing  Urinary: '[ ]'$  chronic Kidney disease, '[ ]'$  on HD - '[ ]'$  MWF or '[ ]'$  TTHS, '[ ]'$  Burning with urination, '[ ]'$  Frequent urination, '[ ]'$  Difficulty urinating;   Skin: No rashes  Psychological: No history of anxiety,  No history of depression   Physical Examination  Filed Vitals:   12/28/15 1543  BP: 97/64  Pulse: 81  Temp: 97.1 F (36.2 C)  TempSrc: Oral  Resp: 18  Height: '5\' 8"'$  (1.727 m)  Weight: 160 lb 3.2 oz (72.666 kg)  SpO2: 98%    Body mass index is 24.36 kg/(m^2).  General:  Alert and oriented, no acute  distress HEENT: Normal Neck: No bruit or JVD Pulmonary: Clear to auscultation bilaterally Cardiac: Regular Rate and Rhythm without murmur Abdomen: Soft, non-tender, non-distended, no mass, no scars Skin: No rash, right first toe dusky at the tip Extremity Pulses:  2+ radial, brachial 1+, femoral, absent popliteal dorsalis pedis, posterior tibial pulses bilaterally Musculoskeletal: No deformity or edema  Neurologic: Upper and lower extremity motor 5/5 and symmetric  DATA:  I reviewed the patient's recent ABIs which were 0.6 on the right 0.9 on the left these were performed at [facility] on 12/12/2015. The patient had arterial duplex of the right leg today which showed monophasic waveforms common femoral down suggesting aortoiliac occlusive disease and superficial femoral artery occlusive disease. He did have biphasic waveforms on the left side.   ASSESSMENT:  Assessment: Nonhealing wound right first toe most likely secondary to aortoiliac possibly SFA occlusive disease as well.   PLAN:  Aortogram bilateral lower extremity runoff possible angioplasty or stenting, partner Dr. Scot Dock on Monday, February 13. Risks benefits possible palpitations and procedure details were discussed with the patient today including not limited to bleeding infection vessel injury contrast reaction renal dysfunction. He understands and agrees to proceed.  Ruta Hinds, MD [facility] [place] Office: [phone] Pager: [phone]

## 2016-01-02 NOTE — Op Note (Signed)
   PATIENT: Henry Ford   MRN: [id number] DOB: [birth year]    DATE OF PROCEDURE: 01/02/2016  INDICATIONS: Henry Ford is a 81 y.o. male presented with a nonhealing wound of the right foot and evidence of multilevel arterial occlusive disease. His creatinine today was 4 and therefore the study was done completely with CO2  PROCEDURE:  1. Ultrasound-guided access to the right common femoral artery 2. Aortogram with bilateral iliac arteriogram using CO2 3. Retrograde right femoral arteriogram with CO2 4. Angioplasty and stenting of the right common iliac artery with a 7 mm x 27 mm balloon expandable stent  SURGEON: Judeth Cornfield. Scot Dock, MD, FACS  ANESTHESIA: local with sedation   EBL: minimal  TECHNIQUE: The patient was taken to the peripheral vascular lab. He received 25 g of fentanyl and 1 mg of Versed at 7:58 AM. He received an additional 50 g of fentanyl during the case. He was observed the entire procedure face-to-face and his heart rate, pulse ox, and blood pressure or monitored by the nurse throughout the case. The case completed at 8:56 AM.  Under ultrasound guidance, after the skin was anesthetized, the right common femoral artery was cannulated with a micropuncture needle and a micropunch and sheath introduced over the wire. This was exchanged for a 5 French sheath over a versa core wire. A pigtail catheter was positioned the L1 vertebral body and flush aortogram obtained. The catheter was positioned above the aortic bifurcation and an oblique iliac projection was obtained. Next the pigtail catheter was removed after a pressure was obtained to the catheter. Pressure was then measured at the femoral sheath and was a 40 mmHg pressure gradient across the iliac stenosis which was identified.  A retrograde right femoral arteriograms obtained with right lower extremity runoff. Then elected to address the 90% right common iliac artery stenosis. The 5 French sheath was exchanged for long 6  French sheath and the patient was then heparinized with 5000 units of IV heparin. ACT was greater than 200. A 7 mm x 27 mm balloon expandable stent was selected and positioned across the stenosis. This was inflated to nominal pressure for 1 minute. Completion film showed no residual stenosis. I then measured a pressure gradient across the stent using the straight catheter. There was no pressure gradient at rest.  The patient was transferred to the holding area for removal of the sheath. No media competitions were noted. He will be kept overnight for observation.   FINDINGS:  1. The infrarenal aorta is widely patent. On the left side the common iliac and external iliac artery are patent as is the hypogastric artery. There is diffuse calcific disease. 2. On the right side, which is the sympathetic side, there was a bulky plaque in the proximal common iliac artery producing an approximate 90% stenosis. This was successfully ballooned and stented as described above. Below that there was some disease in the distal external iliac artery just above the groin. The common femoral, superficial femoral, deep femoral, popliteal, anterior tibial, tibioperoneal trunk, peroneal, posterior tibial arteries were patent.   PRE: 90% stenosis POST: 0% stenosis STENT: 7 mm x 27 mm balloon expandable stent  Deitra Mayo, MD, FACS [facility] [place]  DATE OF DICTATION:   01/02/2016

## 2016-01-02 NOTE — Progress Notes (Signed)
Site area: rfa Site Prior to Removal:  Level o Pressure Applied For:20 min Manual:   yes Patient Status During Pull:stable   Post Pull Site:  Level 0 Post Pull Instructions Given:  yes Post Pull Pulses Present: doppler Dressing Applied:  clear Bedrest begins @ 6282 till 1435 Comments:   till

## 2016-01-03 ENCOUNTER — Ambulatory Visit: Payer: Commercial Managed Care - HMO | Admitting: Osteopathic Medicine

## 2016-01-03 ENCOUNTER — Other Ambulatory Visit: Payer: Self-pay | Admitting: *Deleted

## 2016-01-03 DIAGNOSIS — Z9862 Peripheral vascular angioplasty status: Secondary | ICD-10-CM

## 2016-01-03 DIAGNOSIS — I739 Peripheral vascular disease, unspecified: Secondary | ICD-10-CM

## 2016-01-03 DIAGNOSIS — I70261 Atherosclerosis of native arteries of extremities with gangrene, right leg: Secondary | ICD-10-CM | POA: Diagnosis not present

## 2016-01-03 LAB — BASIC METABOLIC PANEL
ANION GAP: 15 (ref 5–15)
BUN: 61 mg/dL — AB (ref 6–20)
CALCIUM: 8.4 mg/dL — AB (ref 8.9–10.3)
CO2: 18 mmol/L — ABNORMAL LOW (ref 22–32)
Chloride: 107 mmol/L (ref 101–111)
Creatinine, Ser: 2.65 mg/dL — ABNORMAL HIGH (ref 0.61–1.24)
GFR calc Af Amer: 25 mL/min — ABNORMAL LOW (ref >60)
GFR, EST NON AFRICAN AMERICAN: 22 mL/min — AB (ref >60)
GLUCOSE: 83 mg/dL (ref 65–99)
Potassium: 5 mmol/L (ref 3.5–5.1)
Sodium: 140 mmol/L (ref 135–145)

## 2016-01-03 LAB — GLUCOSE, CAPILLARY: Glucose-Capillary: 111 mg/dL — ABNORMAL HIGH (ref 65–99)

## 2016-01-03 MED ORDER — NICOTINE 21 MG/24HR TD PT24
21.0000 mg | MEDICATED_PATCH | Freq: Every day | TRANSDERMAL | Status: DC
  Administered 2016-01-03: 21 mg via TRANSDERMAL
  Filled 2016-01-03: qty 1

## 2016-01-03 MED ORDER — NICOTINE 21 MG/24HR TD PT24: 21.0000 mg | MEDICATED_PATCH | Freq: Every day | TRANSDERMAL | Status: DC

## 2016-01-03 MED ORDER — CLOPIDOGREL BISULFATE 75 MG PO TABS: 75.0000 mg | ORAL_TABLET | Freq: Every day | ORAL | Status: DC

## 2016-01-03 NOTE — Discharge Summary (Signed)
Discharge Summary    Henry Ford July 14, 1935 81 y.o. male  [id number]  Admission Date: 01/02/2016  Discharge Date: 01/03/16  Physician: Angelia Mould, MD  Admission Diagnosis: pvd w-right great toe ulcer   HPI:   This is a 81 y.o. male who presents for evaluation of a nonhealing wound on his right first toe. The patient developed a cellulitis in his foot after an accident with a ladder in early January. He was treated with IV antibiotics in the hospital in late January. The toe has failed to heal. He has been on Bactrim for the last 2 weeks but complains that it upsets his stomach. He has had pain in the right first toe since his accident in early December. He currently smokes one quarter pack of cigarettes per day. Greater than 3 minutes today were spent regarding smoking cessation counseling. He has continuous pain in the right foot. His awakes him from sleep at nighttime. Other chronic medical problems include hypertension enlarged prostate both of which are been stable. He is on aspirin daily. He takes Percocet currently for pain.  Hospital Course:  The patient was admitted to the hospital and taken to the operating room on 01/02/2016 and underwent: 1. Ultrasound-guided access to the right common femoral artery 2. Aortogram with bilateral iliac arteriogram using CO2 3. Retrograde right femoral arteriogram with CO2 4. Angioplasty and stenting of the right common iliac artery with a 7 mm x 27 mm balloon expandable stent    On admission, his creatinine was 4 and therefore, the arteriogram was done completely with CO2.    Findings from the arteriogram are as follows: FINDINGS:  1. The infrarenal aorta is widely patent. On the left side the common iliac and external iliac artery are patent as is the hypogastric artery. There is diffuse calcific disease. 2. On the right side, which is the sympathetic side, there was a bulky plaque in the proximal common iliac artery  producing an approximate 90% stenosis. This was successfully ballooned and stented as described above. Below that there was some disease in the distal external iliac artery just above the groin. The common femoral, superficial femoral, deep femoral, popliteal, anterior tibial, tibioperoneal trunk, peroneal, posterior tibial arteries were patent. The pt tolerated the procedure well and was transported to the PACU in good condition.   By POD 1, he did have brisk doppler signals right PT/AT.  His creatinine on POD 1 decreased at 2.65.  His Cozaar is discontinued at discharge due to his renal function.    The remainder of the hospital course consisted of increasing mobilization and increasing intake of solids without difficulty.  CBC    Component Value Date/Time   HGB 10.9* 01/02/2016 0652   HCT 32.0* 01/02/2016 0652    BMET    Component Value Date/Time   NA 140 01/03/2016 0819   K 5.0 01/03/2016 0819   CL 107 01/03/2016 0819   CO2 18* 01/03/2016 0819   GLUCOSE 83 01/03/2016 0819   BUN 61* 01/03/2016 0819   CREATININE 2.65* 01/03/2016 0819   CREATININE 1.13 12/08/2015 1416   CALCIUM 8.4* 01/03/2016 0819   GFRNONAA 22* 01/03/2016 0819   GFRAA 25* 01/03/2016 0819          Discharge Instructions    Call MD for:  redness, tenderness, or signs of infection (pain, swelling, bleeding, redness, odor or green/yellow discharge around incision site)    Complete by:  As directed      Call MD for:  severe  or increased pain, loss or decreased feeling  in affected limb(s)    Complete by:  As directed      Call MD for:  temperature >100.5    Complete by:  As directed      Driving Restrictions    Complete by:  As directed   No driving for 48 hours and while taking pain medication.     Lifting restrictions    Complete by:  As directed   No lifting for 3 weeks     Resume previous diet    Complete by:  As directed      may wash over wound with mild soap and water    Complete by:  As directed             Discharge Diagnosis:  pvd w-right great toe ulcer  Secondary Diagnosis: Patient Active Problem List   Diagnosis Date Noted  . Atherosclerosis of native arteries of the extremities with ulceration ([place]) 01/02/2016  . Right foot pain 12/30/2015  . Benign fibroma of prostate 03/24/2014  . BP (high blood pressure) 03/24/2014   Past Medical History  Diagnosis Date  . Hypertension   . Prostate enlargement   . Prostate enlargement   . Peripheral vascular disease ([place])   . Right foot pain 12/30/2015    Admitted to [facility] 12/11/2015, discharge 12/14/2015, diagnoses included acute right great toe infection/cellulitis, peripheral vascular disease. ABI demonstrated arterial occlusive disease worse on left. Is referred outpatient to vascular The CT scan documents for copy of his discharge summary. He should follow up with Dr. Ladona Horns, general surgery at [facility]. No dated 02/06/2       Medication List    STOP taking these medications        gabapentin 300 MG capsule  Commonly known as:  NEURONTIN     losartan 100 MG tablet  Commonly known as:  COZAAR      TAKE these medications        aspirin EC 81 MG tablet  Take 81 mg by mouth daily.     clopidogrel 75 MG tablet  Commonly known as:  PLAVIX  Take 1 tablet (75 mg total) by mouth daily.     finasteride 5 MG tablet  Commonly known as:  PROSCAR  Take 5 mg by mouth daily.     levofloxacin 500 MG tablet  Commonly known as:  LEVAQUIN  Take 1 tablet (500 mg total) by mouth daily.     nicotine 21 mg/24hr patch  Commonly known as:  NICODERM CQ - dosed in mg/24 hours  Place 1 patch (21 mg total) onto the skin daily.     oxyCODONE-acetaminophen 5-325 MG tablet  Commonly known as:  PERCOCET/ROXICET  Take 1 tablet by mouth every 6 (six) hours as needed for moderate pain.     terazosin 10 MG capsule  Commonly known as:  HYTRIN  Take 10 mg by mouth at bedtime.        Prescriptions  given: Plavix '75mg'$  daily #30 4RF  Instructions: 1.  Discontinue Cozaar for renal function. 2.  Dry gauze between toes   Disposition: home  Patient's condition: is Good  Follow up: 1. Dr. Scot Dock in 4 weeks with ABI's 2. PCP in one week to check renal function.  (Cozaar discontinued at discharge).   Leontine Locket, PA-C [facility] [phone] 01/03/2016  12:34 PM

## 2016-01-03 NOTE — Progress Notes (Signed)
Discharge Note:   Patient alert and oriented and in no distress. Patient and his wife given discharge instructions regarding the importance of smoking cessation, fall prevention, s/s of infection to report, medication, diet, activity, and upcoming appointments.  Patient and his wife both verbalized understanding of all instructions.  Peripheral IV and telemetry discontinued.  Patient confirmed that he had all of his personal belongings.  He was transported out via wheelchair by hospital volunteer.

## 2016-01-03 NOTE — Progress Notes (Signed)
  Progress Note    01/03/2016 8:07 AM 1 Day Post-Op  Subjective:  Says his foot feels better and is tingling  Afebrile 31'D-176'H systolic HR  60'V-37'T  06% RA  Filed Vitals:   01/03/16 0229 01/03/16 0623  BP: 97/42 90/50  Pulse: 73 78  Temp: 98 F (36.7 C) 98.2 F (36.8 C)  Resp: 16 18    Physical Exam: Cardiac:  regular Lungs:  Non labored Incisions:  Right groin is soft without hematoma Extremities:  Brisk right PT/AT; monophasic DP; dry gangrene right great toe.   CBC    Component Value Date/Time   HGB 10.9* 01/02/2016 0652   HCT 32.0* 01/02/2016 0652    BMET    Component Value Date/Time   NA 135 01/02/2016 0701   K 4.9 01/02/2016 0701   CL 102 01/02/2016 0701   CO2 17* 01/02/2016 0701   GLUCOSE 88 01/02/2016 0701   BUN 89* 01/02/2016 0701   CREATININE 4.01* 01/02/2016 0701   CREATININE 1.13 12/08/2015 1416   CALCIUM 8.6* 01/02/2016 0701   GFRNONAA 13* 01/02/2016 0701   GFRAA 15* 01/02/2016 0701    INR No results found for: INR   Intake/Output Summary (Last 24 hours) at 01/03/16 0807 Last data filed at 01/03/16 0618  Gross per 24 hour  Intake   2060 ml  Output   1400 ml  Net    660 ml     Assessment:  81 y.o. male is s/p:  1. Ultrasound-guided access to the right common femoral artery 2. Aortogram with bilateral iliac arteriogram using CO2 3. Retrograde right femoral arteriogram with CO2 4. Angioplasty and stenting of the right common iliac artery with a 7 mm x 27 mm balloon expandable stent  1 Day Post-Op  Plan: -pt was admitted for hydration and check renal function this morning. -just ordered stat BMP as I don't see results or orders for this -brisk right PT/AT -elevated creatinine-discontinued ARB this morning -dry gauze between the toes on the right with kerlix-no tape   Leontine Locket, PA-C [facility] [phone] 01/03/2016 8:07 AM

## 2016-01-05 ENCOUNTER — Telehealth: Payer: Self-pay | Admitting: Vascular Surgery

## 2016-01-05 ENCOUNTER — Telehealth: Payer: Self-pay | Admitting: Physician Assistant

## 2016-01-05 NOTE — Telephone Encounter (Signed)
-----   Message from Mena Goes, RN sent at 01/03/2016  1:26 PM EST ----- Regarding: schedule   ----- Message -----    From: Gabriel Earing, PA-C    Sent: 01/03/2016  12:32 PM      To: [facility] Charge Pool  Please make appt with pt's PCP in the next week to have BMP to check renal function.  His Cozaar was discontinued at discharge.  Thanks

## 2016-01-05 NOTE — Telephone Encounter (Signed)
Spoke with pts wife to schedule, dpm

## 2016-01-05 NOTE — Telephone Encounter (Signed)
-----   Message from Mena Goes, RN sent at 01/03/2016  1:18 PM EST ----- Regarding: schedule   ----- Message -----    From: Gabriel Earing, PA-C    Sent: 01/03/2016  12:30 PM      To: [facility] Charge Pool  S/p Angioplasty and stenting of the right common iliac artery with a 7 mm x 27 mm balloon expandable stent 01/02/16.  F/u with Dr. Scot Dock in 4 weeks with ABI's.  Thanks, Aldona Bar

## 2016-01-05 NOTE — Telephone Encounter (Signed)
Routed this message to Dr Sheppard Coil, as per her assistant Suanne Marker.

## 2016-01-06 ENCOUNTER — Telehealth: Payer: Self-pay

## 2016-01-06 DIAGNOSIS — B370 Candidal stomatitis: Secondary | ICD-10-CM

## 2016-01-06 MED ORDER — MAGIC MOUTHWASH W/LIDOCAINE: 10.0000 mL | Freq: Four times a day (QID) | ORAL | Status: DC

## 2016-01-06 NOTE — Telephone Encounter (Signed)
Okay, prescription sent

## 2016-01-06 NOTE — Telephone Encounter (Signed)
rX FAXED TO PATIENT PHARMACY. Rhonda Cunningham,CMA

## 2016-01-06 NOTE — Telephone Encounter (Signed)
Patient wife called stated that patient has a thrush in his mouth and its making it hard for him to eat anything she is requesting a Rx for Nystation swish and swallow mouthwash.she was told by the nurse at the hospital that this will help clean the patient mouth Please advise. Patient do have a upcoming hospital follow up appt on Tuesday.

## 2016-01-10 ENCOUNTER — Encounter: Payer: Self-pay | Admitting: Osteopathic Medicine

## 2016-01-10 ENCOUNTER — Ambulatory Visit (INDEPENDENT_AMBULATORY_CARE_PROVIDER_SITE_OTHER): Payer: Commercial Managed Care - HMO | Admitting: Osteopathic Medicine

## 2016-01-10 VITALS — BP 96/62 | HR 87 | Temp 97.5°F | Wt 159.0 lb

## 2016-01-10 DIAGNOSIS — I7025 Atherosclerosis of native arteries of other extremities with ulceration: Secondary | ICD-10-CM

## 2016-01-10 DIAGNOSIS — R630 Anorexia: Secondary | ICD-10-CM

## 2016-01-10 DIAGNOSIS — L97509 Non-pressure chronic ulcer of other part of unspecified foot with unspecified severity: Secondary | ICD-10-CM

## 2016-01-10 DIAGNOSIS — I739 Peripheral vascular disease, unspecified: Secondary | ICD-10-CM

## 2016-01-10 DIAGNOSIS — I9589 Other hypotension: Secondary | ICD-10-CM

## 2016-01-10 DIAGNOSIS — R634 Abnormal weight loss: Secondary | ICD-10-CM

## 2016-01-10 DIAGNOSIS — I959 Hypotension, unspecified: Secondary | ICD-10-CM

## 2016-01-10 DIAGNOSIS — R42 Dizziness and giddiness: Secondary | ICD-10-CM

## 2016-01-10 DIAGNOSIS — R7989 Other specified abnormal findings of blood chemistry: Secondary | ICD-10-CM

## 2016-01-10 DIAGNOSIS — R748 Abnormal levels of other serum enzymes: Secondary | ICD-10-CM | POA: Diagnosis not present

## 2016-01-10 DIAGNOSIS — D649 Anemia, unspecified: Secondary | ICD-10-CM

## 2016-01-10 HISTORY — DX: Non-pressure chronic ulcer of other part of unspecified foot with unspecified severity: I70.25

## 2016-01-10 HISTORY — DX: Non-pressure chronic ulcer of other part of unspecified foot with unspecified severity: L97.509

## 2016-01-10 MED ORDER — MEGESTROL ACETATE 400 MG/10ML PO SUSP: 400.0000 mg | Freq: Every day | ORAL | Status: DC

## 2016-01-10 NOTE — Progress Notes (Signed)
HPI: Henry Ford is a 81 y.o. male who presents to [facility] today for chief complaint of:  Chief Complaint  Patient presents with  . hospital f/u    VASCULAR: Patient is s/p Angioplasty and stenting of the right common iliac artery 05/01/2016, Dr. Scot Dock, to follow-up in 4 weeks with repeat ABI.   HTN/RENAL: Due to elevated creatinine, patient's Cozaar was held on discharge from the hospital due to Cr however BP low today, on review of records was fairly low throughout hospital stay, see below.  CONSTITUTIONAL: Has low appetite since out of the hospital. Weight loss. (+) weakness, feels fever/chills. (+) Dizziness on standing or bending over. 20 pound weight loss over the past month.  GI: (+) Runny bowel movement this morning, first BM in about a week, reported as dark to black. No abdominal pain. No heartburn.   MSK: Foot healing slowly, still painful and swollen, he is not consistent with leg elevation at home   He brings pill bottles: Levaquin (Dr Ruta Hinds), Percocet (Dr Leory Plowman, [facility]), Plavix (Dr. Elodia Florence, [place] 01/03/16).      Past medical, social and family history reviewed: Past Medical History  Diagnosis Date  . Hypertension   . Prostate enlargement   . Prostate enlargement   . Peripheral vascular disease ([place])     angioplasty and stenting of right common iliac 01/02/2016, treat right nonhealing foot ulcer  . Right foot pain 12/30/2015    Admitted to [facility] 12/11/2015, discharge 12/14/2015, diagnoses included acute right great toe infection/cellulitis, peripheral vascular disease. ABI demonstrated arterial occlusive disease worse on left. Is referred outpatient to vascular The CT scan documents for copy of his discharge summary. He should follow up with Dr. Ladona Horns, general surgery at [facility]. No dated 02/06/2   Past Surgical History  Procedure Laterality Date  . Hydrocele excision  1956  . Peripheral  vascular catheterization N/A 01/02/2016    Procedure: Abdominal Aortogram w/Lower Extremity;  Surgeon: Angelia Mould, MD;  Location: [place] [facility];  Service: Cardiovascular;  Laterality: N/A;  . Peripheral vascular catheterization  01/02/2016    Procedure: Peripheral Vascular Intervention;  Surgeon: Angelia Mould, MD;  Location: [place] [facility];  Service: Cardiovascular;;   Social History  Substance Use Topics  . Smoking status: Light Tobacco Smoker -- 0.25 packs/day    Types: Cigarettes  . Smokeless tobacco: Never Used  . Alcohol Use: 0.6 oz/week    1 Cans of beer, 0 Standard drinks or equivalent per week   No family history on file.  Current Outpatient Prescriptions  Medication Sig Dispense Refill  . aspirin EC 81 MG tablet Take 81 mg by mouth daily.    . clopidogrel (PLAVIX) 75 MG tablet Take 1 tablet (75 mg total) by mouth daily. 30 tablet 4  . finasteride (PROSCAR) 5 MG tablet Take 5 mg by mouth daily.     Marland Kitchen levofloxacin (LEVAQUIN) 500 MG tablet Take 1 tablet (500 mg total) by mouth daily. 14 tablet 0  . magic mouthwash w/lidocaine SOLN Take 10 mLs by mouth 4 (four) times daily. Swish for 2 minutes then spit out. 500 mL 1  . nicotine (NICODERM CQ - DOSED IN MG/24 HOURS) 21 mg/24hr patch Place 1 patch (21 mg total) onto the skin daily. 28 patch 0  . oxyCODONE-acetaminophen (PERCOCET/ROXICET) 5-325 MG tablet Take 1 tablet by mouth every 6 (six) hours as needed for moderate pain.     Marland Kitchen terazosin (HYTRIN) 10 MG  capsule Take 10 mg by mouth at bedtime.      No current facility-administered medications for this visit.   No Known Allergies    Review of Systems: CONSTITUTIONAL:  No  fever, (+) chills, (+) unintentional weight changes HEAD/EYES/EARS/NOSE/THROAT: No  headache, no vision change, no hearing change, No  sore throat, No  sinus pressure CARDIAC: No  chest pain, No  pressure, No palpitations, No  orthopnea RESPIRATORY: No  cough, No  shortness of  breath/wheeze GASTROINTESTINAL: No  nausea, No  vomiting, No  abdominal pain, (?) blood in stool, No  diarrhea, (+) x1 week constipation, (+) x1 loose stool this AM as per HPI MUSCULOSKELETAL: (+) myalgia/arthralgia SKIN: No  rash/wounds/concerning lesions HEM/ONC: No  easy bruising/bleeding, No  abnormal lymph node NEUROLOGIC: (+) generalized weakness, (+) orthostatic dizziness, No  slurred speech, confusion or unilateral weakness  Exam:  BP 96/62 mmHg  Pulse 87  Wt 159 lb (72.122 kg)  SpO2 99% Constitutional: VS see above. General Appearance: alert, well-developed, well-nourished, NAD Eyes: Normal lids and conjunctive, non-icteric sclera,  Respiratory: Normal respiratory effort. no wheeze, no rhonchi, no rales Cardiovascular: S1/S2 normal, no murmur, no rub/gallop auscultated. RRR.  Lower extremities - feet warmer than previous exam on R, (+) edema 2-3+ on RLE Gastrointestinal: Nontender, no masses. Rectal exam declined by patient.  Neurological: No cranial nerve deficit on limited exam. Motor and sensation intact and symmetric Skin: warm, dry. (+) R foot 1st digit ulcer - healing. R groin cath site no signs infection No concerning nevi or subq nodules on limited exam. Psychiatric: Normal judgment/insight. Normal mood and affect. Oriented x3.    No results found for this or any previous visit (from the past 72 hour(s)).  Records reviewed: Patient was hospitalized 01/02/16 to 01/03/16 for vascular procedure. On admission, blood pressure is 95/55, highest it was 134/68, blood pressure was usually systolic in the 01U and diastolic in the 27O or 53G, last blood pressure measurement prior to discharge was 102/55, today in the office is 95/50. Records indicate that the patient's Cozaar was stopped due to concern for renal function   ASSESSMENT/PLAN: Continue to hold Cozaar due to concern for hypotension, I also stops patient'sTerazosin, started Megace to help with appetite.   Patient reports  significantly decreased appetite since undergoing treatment for right foot venous stasis ulcer, however other considerations certainly could include malignancy, patient is a smoker, terminology has not had routine lung cancer screening in the past. Also concern for malnourishment/malabsorption.  I discussed with the patient and his wife that I was very concerned given his dizziness, low blood pressure, his reports of dark black bowel movements this morning certainly concerning for possible GI bleed. Patient denies rectal exam for fecal occult blood test in the office. Patient is advised that Plavix can certainly increase the risk of GI bleed, he was placed on this after his vascular procedure last week, we keep him on it for secondary prevention of cardiac/vascular problems however if Hct substantially low he will certainly need to consider discontinuing this and will refer to GI.   ER precautions were extensively reviewed with the patient and his wife, include worsening dizziness or weakness, loss of consciousness or fall, continued suspected bloody stool or loose stool/diarrhea, any other concerns. Depending on lab results, would consider further workup/referral to GI. Patient advised to follow-up in the office next week to recheck blood pressure and monitor weight.   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

## 2016-01-10 NOTE — Patient Instructions (Signed)
STOP Terazosin - this medication is for prostate but it can also lower blood pressure.  START Megace - this will help stimulate appetite  We are getting blood work today to evaluate for anemia as well as other causes of low blood pressure, we may need to stop your blood thinner medication Plavix and Aspirin if you're significantly anemic.   Please note, you have declined a rectal exam today to test for blood in the stool, if you're having an internal bleeding you may continue to have black stools, which may require colonoscopy for further evaluation and treatment. If you're having significant black or bloody stools, you need to go to the emergency room right away, especially if you are feeling faint or more weak. Plavix is a blood in her medicine that puts people at risk for GI bleeding.   Please follow up in one week with Dr. Sheppard Coil to recheck blood pressure and recheck your weight. Please see care sooner either here in the clinic or in the emergency room if you are feeling worse.     Hypotension As your heart beats, it forces blood through your arteries. This force is your blood pressure. If your blood pressure is too low for you to go about your normal activities or to support the organs of your body, you have hypotension. Hypotension is also referred to as low blood pressure. When your blood pressure becomes too low, you may not get enough blood to your brain. As a result, you may feel weak, feel lightheaded, or develop a rapid heart rate. In a more severe case, you may faint. CAUSES Various conditions can cause hypotension. These include:  Blood loss.  Dehydration.  Heart or endocrine problems.  Pregnancy.  Severe infection.  Not having a well-balanced diet filled with needed nutrients.  Severe allergic reactions (anaphylaxis). Some medicines, such as blood pressure medicine or water pills (diuretics), may lower your blood pressure below normal. Sometimes taking too much medicine  or taking medicine not as directed can cause hypotension. TREATMENT  Hospitalization is sometimes required for hypotension if fluid or blood replacement is needed, if time is needed for medicines to wear off, or if further monitoring is needed. Treatment might include changing your diet, changing your medicines (including medicines aimed at raising your blood pressure), and use of support stockings. HOME CARE INSTRUCTIONS   Drink enough fluids to keep your urine clear or pale yellow.  Take your medicines as directed by your health care provider.  Get up slowly from reclining or sitting positions. This gives your blood pressure a chance to adjust.  Wear support stockings as directed by your health care provider.  Maintain a healthy diet by including nutritious food, such as fruits, vegetables, nuts, whole grains, and lean meats. SEEK MEDICAL CARE IF:  You have vomiting or diarrhea.  You have a fever for more than 2-3 days.  You feel more thirsty than usual.  You feel weak and tired. SEEK IMMEDIATE MEDICAL CARE IF:   You have chest pain or a fast or irregular heartbeat.  You have a loss of feeling in some part of your body, or you lose movement in your arms or legs.  You have trouble speaking.  You become sweaty or feel lightheaded.  You faint. MAKE SURE YOU:   Understand these instructions.  Will watch your condition.  Will get help right away if you are not doing well or get worse.   This information is not intended to replace advice given to  you by your health care provider. Make sure you discuss any questions you have with your health care provider.   Document Released: 11/05/2005 Document Revised: 08/26/2013 Document Reviewed: 05/08/2013 Elsevier Interactive Patient Education [employer].

## 2016-01-11 ENCOUNTER — Encounter: Payer: Self-pay | Admitting: Osteopathic Medicine

## 2016-01-11 DIAGNOSIS — I96 Gangrene, not elsewhere classified: Secondary | ICD-10-CM | POA: Insufficient documentation

## 2016-01-11 LAB — CBC WITH DIFFERENTIAL/PLATELET
BASOS PCT: 0 % (ref 0–1)
Basophils Absolute: 0 10*3/uL (ref 0.0–0.1)
EOS ABS: 0.4 10*3/uL (ref 0.0–0.7)
Eosinophils Relative: 3 % (ref 0–5)
HEMATOCRIT: 27.7 % — AB (ref 39.0–52.0)
Hemoglobin: 9.2 g/dL — ABNORMAL LOW (ref 13.0–17.0)
LYMPHS ABS: 2.8 10*3/uL (ref 0.7–4.0)
Lymphocytes Relative: 22 % (ref 12–46)
MCH: 31.1 pg (ref 26.0–34.0)
MCHC: 33.2 g/dL (ref 30.0–36.0)
MCV: 93.6 fL (ref 78.0–100.0)
MPV: 8.3 fL — AB (ref 8.6–12.4)
Monocytes Absolute: 1.5 10*3/uL — ABNORMAL HIGH (ref 0.1–1.0)
Monocytes Relative: 12 % (ref 3–12)
NEUTROS ABS: 8.1 10*3/uL — AB (ref 1.7–7.7)
NEUTROS PCT: 63 % (ref 43–77)
PLATELETS: 307 10*3/uL (ref 150–400)
RBC: 2.96 MIL/uL — AB (ref 4.22–5.81)
RDW: 13.6 % (ref 11.5–15.5)
WBC: 12.9 10*3/uL — ABNORMAL HIGH (ref 4.0–10.5)

## 2016-01-11 LAB — COMPLETE METABOLIC PANEL WITH GFR
ALT: 7 U/L — ABNORMAL LOW (ref 9–46)
AST: 13 U/L (ref 10–35)
Albumin: 3.2 g/dL — ABNORMAL LOW (ref 3.6–5.1)
Alkaline Phosphatase: 60 U/L (ref 40–115)
BUN: 38 mg/dL — AB (ref 7–25)
CALCIUM: 8.6 mg/dL (ref 8.6–10.3)
CHLORIDE: 98 mmol/L (ref 98–110)
CO2: 21 mmol/L (ref 20–31)
Creat: 2.11 mg/dL — ABNORMAL HIGH (ref 0.70–1.18)
GFR, Est African American: 34 mL/min — ABNORMAL LOW (ref >=60)
GFR, Est Non African American: 29 mL/min — ABNORMAL LOW (ref >=60)
Glucose, Bld: 80 mg/dL (ref 65–99)
POTASSIUM: 4.6 mmol/L (ref 3.5–5.3)
SODIUM: 137 mmol/L (ref 135–146)
Total Bilirubin: 0.3 mg/dL (ref 0.2–1.2)
Total Protein: 5.9 g/dL — ABNORMAL LOW (ref 6.1–8.1)

## 2016-01-11 LAB — IRON AND TIBC
%SAT: 17 % (ref 15–60)
Iron: 30 ug/dL — ABNORMAL LOW (ref 50–180)
TIBC: 177 ug/dL — AB (ref 250–425)
UIBC: 147 ug/dL (ref 125–400)

## 2016-01-11 LAB — RETICULOCYTES
ABS Retic: 35.6 10*3/uL (ref 19.0–186.0)
RBC.: 2.97 MIL/uL — AB (ref 4.22–5.81)
Retic Ct Pct: 1.2 % (ref 0.4–2.3)

## 2016-01-11 LAB — FERRITIN: Ferritin: 449 ng/mL — ABNORMAL HIGH (ref 20–380)

## 2016-01-11 LAB — TSH: TSH: 3.28 m[IU]/L (ref 0.40–4.50)

## 2016-01-11 NOTE — Addendum Note (Signed)
Addended by: Maryla Morrow on: 01/11/2016 08:55 AM   Modules accepted: Orders

## 2016-01-12 LAB — PATHOLOGIST SMEAR REVIEW

## 2016-01-13 ENCOUNTER — Telehealth: Payer: Self-pay

## 2016-01-13 NOTE — Telephone Encounter (Signed)
Patient spouse called and stated that since he has been off his Terazosin 10 he is not urinating as he should. Patient was advised the risk with his blood pressure dropping while on this medication as the reason why it was stopped ao per Dr. Sheppard Coil if patient wants to go back on the medication that it was up to him considering that he knows the risk. Patient is scheduled to follow up on 01/17/16.[facility]   Patient and spouse understands this information that was given to them. Rhonda Cunningham,CMA

## 2016-01-17 ENCOUNTER — Ambulatory Visit (INDEPENDENT_AMBULATORY_CARE_PROVIDER_SITE_OTHER): Payer: Commercial Managed Care - HMO | Admitting: Osteopathic Medicine

## 2016-01-17 ENCOUNTER — Encounter: Payer: Self-pay | Admitting: Osteopathic Medicine

## 2016-01-17 VITALS — BP 110/60 | HR 61 | Ht 68.0 in | Wt 157.0 lb

## 2016-01-17 DIAGNOSIS — Y92009 Unspecified place in unspecified non-institutional (private) residence as the place of occurrence of the external cause: Secondary | ICD-10-CM

## 2016-01-17 DIAGNOSIS — R42 Dizziness and giddiness: Secondary | ICD-10-CM

## 2016-01-17 DIAGNOSIS — I959 Hypotension, unspecified: Secondary | ICD-10-CM

## 2016-01-17 DIAGNOSIS — R634 Abnormal weight loss: Secondary | ICD-10-CM | POA: Diagnosis not present

## 2016-01-17 DIAGNOSIS — Z7901 Long term (current) use of anticoagulants: Secondary | ICD-10-CM

## 2016-01-17 DIAGNOSIS — W19XXXA Unspecified fall, initial encounter: Secondary | ICD-10-CM

## 2016-01-17 DIAGNOSIS — Y92099 Unspecified place in other non-institutional residence as the place of occurrence of the external cause: Secondary | ICD-10-CM

## 2016-01-17 DIAGNOSIS — K921 Melena: Secondary | ICD-10-CM | POA: Diagnosis not present

## 2016-01-17 NOTE — Progress Notes (Signed)
HPI: Henry Ford is a 81 y.o. male who presents to [place] today for chief complaint of:  Chief Complaint  Patient presents with  . Follow-up    Weight check, anemia, hypotension    Patient last seen about 1 week ago for follow-up from angioplasty and stenting of right common iliac artery. At that time patient's blood pressure was low, he is reporting low appetite, 20 pound weight loss over the past month or so. That time he also reported dark black tarry stool 1, declines rectal exam at that time. Labs revealed anemia, anemia of chronic disease/inflammation but also concern for possible GI bleed. He is following up today for weight check, blood pressure recheck, and anemia.  Patient is still reporting low appetite, has lost 2 pounds over the last week. Wife, after reviewing medication list was knee, thinks she is not giving him enough of the Megace.   Reports dark black stool x 3 since last seen by me. Patient again declines rectal exam today.  Patient is frustrated with his weight loss and fatigue. He has also experienced 2 falls at home, he is feeling weak, he thinks the second fall may have been due to dizziness, but he says he never really lost consciousness, no headache or vision change, no neuro symptoms concerning for unilateral weakness, speech difficulty or facial droop.   Past medical, social and family history reviewed: Past Medical History  Diagnosis Date  . Hypertension   . Prostate enlargement   . Prostate enlargement   . Peripheral vascular disease ([place])     angioplasty and stenting of right common iliac 01/02/2016, treat right nonhealing foot ulcer  . Right foot pain 12/30/2015    Admitted to [facility] 12/11/2015, discharge 12/14/2015, diagnoses included acute right great toe infection/cellulitis, peripheral vascular disease. ABI demonstrated arterial occlusive disease worse on left. Is referred outpatient to vascular The CT scan  documents for copy of his discharge summary. He should follow up with Dr. Ladona Horns, general surgery at [facility]. No dated 02/06/2   Past Surgical History  Procedure Laterality Date  . Hydrocele excision  1956  . Peripheral vascular catheterization N/A 01/02/2016    Procedure: Abdominal Aortogram w/Lower Extremity;  Surgeon: Angelia Mould, MD;  Location: [place] [facility];  Service: Cardiovascular;  Laterality: N/A;  . Peripheral vascular catheterization  01/02/2016    Procedure: Peripheral Vascular Intervention;  Surgeon: Angelia Mould, MD;  Location: [place] [facility];  Service: Cardiovascular;;   Social History  Substance Use Topics  . Smoking status: Light Tobacco Smoker -- 0.25 packs/day    Types: Cigarettes  . Smokeless tobacco: Never Used  . Alcohol Use: 0.6 oz/week    1 Cans of beer, 0 Standard drinks or equivalent per week   No family history on file.  Current Outpatient Prescriptions  Medication Sig Dispense Refill  . aspirin EC 81 MG tablet Take 81 mg by mouth daily.    . clopidogrel (PLAVIX) 75 MG tablet Take 1 tablet (75 mg total) by mouth daily. 30 tablet 4  . finasteride (PROSCAR) 5 MG tablet Take 5 mg by mouth daily.     . megestrol (MEGACE) 400 MG/10ML suspension Take 10 mLs (400 mg total) by mouth daily. 240 mL 2  . nicotine (NICODERM CQ - DOSED IN MG/24 HOURS) 21 mg/24hr patch Place 1 patch (21 mg total) onto the skin daily. 28 patch 0   No current facility-administered medications for this visit.  No Known Allergies    Review of Systems: CONSTITUTIONAL:  No  fever, no chills, No  unintentional weight changes HEAD/EYES/EARS/NOSE/THROAT: No  headache, no vision change, no hearing change, No  sore throat, No  sinus pressure CARDIAC: No  chest pain, No  pressure, No palpitations, No  orthopnea RESPIRATORY: No  cough, No  shortness of breath/wheeze GASTROINTESTINAL: No  nausea, No  vomiting, No  abdominal pain, No  blood in stool,  No  diarrhea, No  constipation  MUSCULOSKELETAL: No  myalgia/arthralgia GENITOURINARY: No  incontinence, No  abnormal genital bleeding/discharge SKIN: No  rash/wounds/concerning lesions HEM/ONC: No  easy bruising/bleeding, No  abnormal lymph node ENDOCRINE: No polyuria/polydipsia/polyphagia, No  heat/cold intolerance  NEUROLOGIC: No  weakness, No  dizziness, No  slurred speech PSYCHIATRIC: No  concerns with depression, No  concerns with anxiety, No sleep problems  Exam:  BP 110/60 mmHg  Pulse 61  Ht '5\' 8"'$  (1.727 m)  Wt 157 lb (71.215 kg)  BMI 23.88 kg/m2 Constitutional: VS see above. General Appearance: alert, thin, NAD Eyes: Normal lids and conjunctive, non-icteric sclera Ears, Nose, Mouth, Throat: MMM.  Respiratory: Normal respiratory effort. no wheeze, no rhonchi, no rales Cardiovascular: S1/S2 normal, no murmur, no rub/gallop auscultated. RRR. Gastrointestinal: Nontender. Rectal exam declined by patient.  Neurological: No cranial nerve deficit on limited exam. Motor and sensation intact and symmetric Skin: warm, dry, intact. (+) ecchymoses on dorsal hands and inferior to L eye.  Psychiatric: Normal judgment/insight. Depressed mood and affect. Oriented.   No results found for this or any previous visit (from the past 72 hour(s)).    ASSESSMENT/PLAN: Extensive discussion with the patient and his wife regarding options for workup and treatment.   I advised the patient that his anemia and dark stools while being on anticoagulation are certainly concerning for possible GI bleed, he does not wish to do a fecal occult blood test, I advised we can refer to GI and they can talk to him about doing a colonoscopy/endoscopy which would be the best way to find and treat GI bleed if one exists. Patient states that he is tired of going to multiple doctors, he wants to think about this. I advised him strongly that this is my recommendation to diagnose and treat a potentially deadly problem.  Patient is alert and oriented, has capacity to make his medical decisions. Patient was counseled on the risks versus benefits, possibility of severe bleeding and death if he ignores this problem.  Checking his blood pressure at home, states the top number is typically 108 or higher, manual recheck of blood pressure is 110/60. Concerning for falls at home, fall precautions reviewed. Increase Megace to prescribed dose for appetite stimulation. Weight loss is also a concern, if he is not gaining weight or if weight continues to drop, would consider CT chest abdomen pelvis for further workup.   Loss of weight - Concerning for low appetite, differential diagnosis of course includes malignancy/malabsorption   Hypotension, unspecified hypotension type - Concern for GI bleed, inadequate hydration   Orthostatic dizziness - Anemia, inadequate nutrition/hydration   Black stool - Declines FOBT. GI referral advised, patient declines today, will think about it.    Fall at home, initial encounter - Declined PT referral   On continuous oral anticoagulation - Plavix for recent stent procedure in iliac artery, concern for anemia/GI bleed versus risk of compromising the stent   Patient appears stable from previous visit, advised that he should plan to follow-up with me in the next  few weeks to continue of his hypotension, weight, anemia. He should come in sooner if any questions or concerns or new problems arise. Understandably, he is frustrated with going to multiple doctor visits and tired of feeling ill, resistant to more procedures and doctor visits, again I advised him of my above recommendations regarding further workup and treatment to get him feeling better and address his very concerning medical problems. ER precautions were reviewed at length with patient and his wife. Plan to call patient tomorrow to check up on whether he has decided about GI referral or not.  Total time spent 25 minutes, greater  than 50% of the visit was counseling and coordinating care for diagnosis of ANEMIA, POSSIBLE GI BLEED.

## 2016-01-18 ENCOUNTER — Telehealth: Payer: Self-pay

## 2016-01-18 DIAGNOSIS — R634 Abnormal weight loss: Secondary | ICD-10-CM

## 2016-01-18 DIAGNOSIS — Z7901 Long term (current) use of anticoagulants: Secondary | ICD-10-CM

## 2016-01-18 DIAGNOSIS — K921 Melena: Secondary | ICD-10-CM

## 2016-01-18 DIAGNOSIS — D6489 Other specified anemias: Secondary | ICD-10-CM

## 2016-01-18 NOTE — Telephone Encounter (Signed)
Patient wife called stated that she spoke to her husband and he is ok  with having the Colonoscopy done. Please advise. Benjiman Sedgwick,CMA

## 2016-01-18 NOTE — Telephone Encounter (Signed)
Referral placed, let me know if don't hear anything by Friday morning about scheduling an appointment

## 2016-01-19 ENCOUNTER — Other Ambulatory Visit: Payer: Self-pay | Admitting: Osteopathic Medicine

## 2016-01-19 DIAGNOSIS — I739 Peripheral vascular disease, unspecified: Secondary | ICD-10-CM

## 2016-01-19 NOTE — Telephone Encounter (Signed)
Patient spouse has been informed. Rhonda Cunningham,CMA

## 2016-01-20 ENCOUNTER — Ambulatory Visit (INDEPENDENT_AMBULATORY_CARE_PROVIDER_SITE_OTHER): Payer: Commercial Managed Care - HMO | Admitting: Gastroenterology

## 2016-01-20 ENCOUNTER — Encounter: Payer: Self-pay | Admitting: Gastroenterology

## 2016-01-20 ENCOUNTER — Other Ambulatory Visit (INDEPENDENT_AMBULATORY_CARE_PROVIDER_SITE_OTHER): Payer: Commercial Managed Care - HMO

## 2016-01-20 ENCOUNTER — Inpatient Hospital Stay (HOSPITAL_COMMUNITY)
Admission: EM | Admit: 2016-01-20 | Discharge: 2016-01-23 | DRG: 378 | Disposition: A | Discharge status: Home / Self Care | Payer: Commercial Managed Care - HMO | Attending: Internal Medicine | Admitting: Internal Medicine

## 2016-01-20 ENCOUNTER — Encounter (HOSPITAL_COMMUNITY): Payer: Self-pay | Admitting: *Deleted

## 2016-01-20 VITALS — BP 120/60 | HR 72 | Ht 68.0 in | Wt 158.6 lb

## 2016-01-20 DIAGNOSIS — Z7902 Long term (current) use of antithrombotics/antiplatelets: Secondary | ICD-10-CM

## 2016-01-20 DIAGNOSIS — K922 Gastrointestinal hemorrhage, unspecified: Secondary | ICD-10-CM | POA: Diagnosis present

## 2016-01-20 DIAGNOSIS — R5383 Other fatigue: Secondary | ICD-10-CM | POA: Diagnosis present

## 2016-01-20 DIAGNOSIS — D692 Other nonthrombocytopenic purpura: Secondary | ICD-10-CM | POA: Diagnosis not present

## 2016-01-20 DIAGNOSIS — N400 Enlarged prostate without lower urinary tract symptoms: Secondary | ICD-10-CM | POA: Diagnosis present

## 2016-01-20 DIAGNOSIS — M7989 Other specified soft tissue disorders: Secondary | ICD-10-CM | POA: Diagnosis not present

## 2016-01-20 DIAGNOSIS — R634 Abnormal weight loss: Secondary | ICD-10-CM

## 2016-01-20 DIAGNOSIS — D72829 Elevated white blood cell count, unspecified: Secondary | ICD-10-CM | POA: Diagnosis present

## 2016-01-20 DIAGNOSIS — K2210 Ulcer of esophagus without bleeding: Secondary | ICD-10-CM | POA: Insufficient documentation

## 2016-01-20 DIAGNOSIS — D649 Anemia, unspecified: Secondary | ICD-10-CM | POA: Insufficient documentation

## 2016-01-20 DIAGNOSIS — N179 Acute kidney failure, unspecified: Secondary | ICD-10-CM | POA: Diagnosis present

## 2016-01-20 DIAGNOSIS — X58XXXA Exposure to other specified factors, initial encounter: Secondary | ICD-10-CM | POA: Diagnosis present

## 2016-01-20 DIAGNOSIS — I493 Ventricular premature depolarization: Secondary | ICD-10-CM | POA: Diagnosis not present

## 2016-01-20 DIAGNOSIS — R630 Anorexia: Secondary | ICD-10-CM | POA: Diagnosis not present

## 2016-01-20 DIAGNOSIS — N183 Chronic kidney disease, stage 3 (moderate): Secondary | ICD-10-CM | POA: Diagnosis present

## 2016-01-20 DIAGNOSIS — L97519 Non-pressure chronic ulcer of other part of right foot with unspecified severity: Secondary | ICD-10-CM | POA: Diagnosis present

## 2016-01-20 DIAGNOSIS — D7589 Other specified diseases of blood and blood-forming organs: Secondary | ICD-10-CM | POA: Diagnosis present

## 2016-01-20 DIAGNOSIS — K449 Diaphragmatic hernia without obstruction or gangrene: Secondary | ICD-10-CM | POA: Diagnosis present

## 2016-01-20 DIAGNOSIS — F17210 Nicotine dependence, cigarettes, uncomplicated: Secondary | ICD-10-CM | POA: Diagnosis present

## 2016-01-20 DIAGNOSIS — D473 Essential (hemorrhagic) thrombocythemia: Secondary | ICD-10-CM | POA: Diagnosis not present

## 2016-01-20 DIAGNOSIS — Z7982 Long term (current) use of aspirin: Secondary | ICD-10-CM | POA: Diagnosis not present

## 2016-01-20 DIAGNOSIS — S0083XA Contusion of other part of head, initial encounter: Secondary | ICD-10-CM | POA: Diagnosis present

## 2016-01-20 DIAGNOSIS — D509 Iron deficiency anemia, unspecified: Secondary | ICD-10-CM | POA: Diagnosis not present

## 2016-01-20 DIAGNOSIS — R195 Other fecal abnormalities: Secondary | ICD-10-CM

## 2016-01-20 DIAGNOSIS — R008 Other abnormalities of heart beat: Secondary | ICD-10-CM | POA: Diagnosis not present

## 2016-01-20 DIAGNOSIS — Z79899 Other long term (current) drug therapy: Secondary | ICD-10-CM | POA: Diagnosis not present

## 2016-01-20 DIAGNOSIS — I739 Peripheral vascular disease, unspecified: Secondary | ICD-10-CM | POA: Diagnosis present

## 2016-01-20 DIAGNOSIS — I129 Hypertensive chronic kidney disease with stage 1 through stage 4 chronic kidney disease, or unspecified chronic kidney disease: Secondary | ICD-10-CM | POA: Diagnosis present

## 2016-01-20 DIAGNOSIS — K222 Esophageal obstruction: Secondary | ICD-10-CM | POA: Insufficient documentation

## 2016-01-20 DIAGNOSIS — K264 Chronic or unspecified duodenal ulcer with hemorrhage: Secondary | ICD-10-CM | POA: Diagnosis present

## 2016-01-20 DIAGNOSIS — K921 Melena: Secondary | ICD-10-CM | POA: Insufficient documentation

## 2016-01-20 DIAGNOSIS — K259 Gastric ulcer, unspecified as acute or chronic, without hemorrhage or perforation: Secondary | ICD-10-CM | POA: Diagnosis present

## 2016-01-20 DIAGNOSIS — D62 Acute posthemorrhagic anemia: Secondary | ICD-10-CM | POA: Diagnosis present

## 2016-01-20 HISTORY — DX: Other fecal abnormalities: R19.5

## 2016-01-20 HISTORY — DX: Polyp of colon: K63.5

## 2016-01-20 HISTORY — DX: Anemia, unspecified: D64.9

## 2016-01-20 LAB — COMPREHENSIVE METABOLIC PANEL
ALK PHOS: 60 U/L (ref 38–126)
ALT: 13 U/L — ABNORMAL LOW (ref 17–63)
ANION GAP: 10 (ref 5–15)
AST: 21 U/L (ref 15–41)
Albumin: 3.1 g/dL — ABNORMAL LOW (ref 3.5–5.0)
BILIRUBIN TOTAL: 0.4 mg/dL (ref 0.3–1.2)
BUN: 28 mg/dL — AB (ref 6–20)
CO2: 22 mmol/L (ref 22–32)
Calcium: 8.3 mg/dL — ABNORMAL LOW (ref 8.9–10.3)
Chloride: 108 mmol/L (ref 101–111)
Creatinine, Ser: 1.97 mg/dL — ABNORMAL HIGH (ref 0.61–1.24)
GFR calc Af Amer: 36 mL/min — ABNORMAL LOW (ref >60)
GFR calc non Af Amer: 31 mL/min — ABNORMAL LOW (ref >60)
Glucose, Bld: 110 mg/dL — ABNORMAL HIGH (ref 65–99)
Potassium: 4.7 mmol/L (ref 3.5–5.1)
Sodium: 140 mmol/L (ref 135–145)
TOTAL PROTEIN: 6.1 g/dL — AB (ref 6.5–8.1)

## 2016-01-20 LAB — CBC
HEMATOCRIT: 25.1 % — AB (ref 39.0–52.0)
Hemoglobin: 8.3 g/dL — ABNORMAL LOW (ref 13.0–17.0)
MCH: 32.2 pg (ref 26.0–34.0)
MCHC: 33.1 g/dL (ref 30.0–36.0)
MCV: 97.3 fL (ref 78.0–100.0)
PLATELETS: 465 10*3/uL — AB (ref 150–400)
RBC: 2.58 MIL/uL — AB (ref 4.22–5.81)
RDW: 14.6 % (ref 11.5–15.5)
WBC: 12.3 10*3/uL — AB (ref 4.0–10.5)

## 2016-01-20 LAB — CBC WITH DIFFERENTIAL/PLATELET
BASOS PCT: 0.6 % (ref 0.0–3.0)
Basophils Absolute: 0.1 10*3/uL (ref 0.0–0.1)
EOS PCT: 2 % (ref 0.0–5.0)
Eosinophils Absolute: 0.3 10*3/uL (ref 0.0–0.7)
LYMPHS PCT: 27.2 % (ref 12.0–46.0)
Lymphs Abs: 3.5 10*3/uL (ref 0.7–4.0)
MCHC: 33.7 g/dL (ref 30.0–36.0)
MCV: 94.6 fl (ref 78.0–100.0)
Monocytes Absolute: 1.3 10*3/uL — ABNORMAL HIGH (ref 0.1–1.0)
Monocytes Relative: 10.1 % (ref 3.0–12.0)
NEUTROS ABS: 7.8 10*3/uL — AB (ref 1.4–7.7)
Neutrophils Relative %: 60.1 % (ref 43.0–77.0)
PLATELETS: 481 10*3/uL — AB (ref 150.0–400.0)
RBC: 2.59 Mil/uL — ABNORMAL LOW (ref 4.22–5.81)
RDW: 14.3 % (ref 11.5–15.5)
WBC: 12.9 10*3/uL — AB (ref 4.0–10.5)

## 2016-01-20 LAB — PROTIME-INR
INR: 1.08 (ref 0.00–1.49)
PROTHROMBIN TIME: 14.2 s (ref 11.6–15.2)

## 2016-01-20 LAB — ABO/RH: ABO/RH(D): O NEG

## 2016-01-20 MED ORDER — SODIUM CHLORIDE 0.9 % IV SOLN: INTRAVENOUS | Status: DC

## 2016-01-20 MED ORDER — PANTOPRAZOLE SODIUM 40 MG IV SOLR
40.0000 mg | Freq: Two times a day (BID) | INTRAVENOUS | Status: DC
  Administered 2016-01-20 – 2016-01-21 (×3): 40 mg via INTRAVENOUS
  Filled 2016-01-20 (×5): qty 40

## 2016-01-20 MED ORDER — NICOTINE 21 MG/24HR TD PT24
21.0000 mg | MEDICATED_PATCH | Freq: Every day | TRANSDERMAL | Status: DC
  Administered 2016-01-20 – 2016-01-23 (×4): 21 mg via TRANSDERMAL
  Filled 2016-01-20 (×4): qty 1

## 2016-01-20 MED ORDER — ONDANSETRON HCL 4 MG/2ML IJ SOLN: 4.0000 mg | Freq: Four times a day (QID) | INTRAMUSCULAR | Status: DC | PRN

## 2016-01-20 MED ORDER — ONDANSETRON HCL 4 MG PO TABS: 4.0000 mg | ORAL_TABLET | Freq: Four times a day (QID) | ORAL | Status: DC | PRN

## 2016-01-20 MED ORDER — NICOTINE 21 MG/24HR TD PT24: 21.0000 mg | MEDICATED_PATCH | Freq: Every day | TRANSDERMAL | Status: DC

## 2016-01-20 MED ORDER — FINASTERIDE 5 MG PO TABS
5.0000 mg | ORAL_TABLET | Freq: Every day | ORAL | Status: DC
  Administered 2016-01-20 – 2016-01-23 (×3): 5 mg via ORAL
  Filled 2016-01-20 (×4): qty 1

## 2016-01-20 MED ORDER — SODIUM CHLORIDE 0.9 % IV SOLN
INTRAVENOUS | Status: DC
  Administered 2016-01-20 – 2016-01-22 (×2): via INTRAVENOUS

## 2016-01-20 MED ORDER — PANTOPRAZOLE SODIUM 40 MG IV SOLR
80.0000 mg | Freq: Once | INTRAVENOUS | Status: AC
  Administered 2016-01-20: 80 mg via INTRAVENOUS
  Filled 2016-01-20: qty 80

## 2016-01-20 MED ORDER — TERAZOSIN HCL 5 MG PO CAPS
10.0000 mg | ORAL_CAPSULE | Freq: Every day | ORAL | Status: DC
  Administered 2016-01-20 – 2016-01-22 (×3): 10 mg via ORAL
  Filled 2016-01-20 (×4): qty 2

## 2016-01-20 NOTE — Patient Instructions (Addendum)
Proceed directly to the [facility] Emergency Department to be evaluated and admitted for GI bleeding and anemia.  Thank you for choosing [facility]  Dr Wilfrid Lund III

## 2016-01-20 NOTE — ED Notes (Signed)
Pt has an irregular pulse there is a discrepancy between the HR the pulse Ox picks and the monitor.  Pt's pulses palpated and was about 68 beats/min when pulse on finger read in the 40s.

## 2016-01-20 NOTE — Progress Notes (Addendum)
Gastroenterology and Hepatology Consult Note:  History: Henry Ford 01/20/2016  Referring physician: [name], DO  Reason for consult/chief complaint: Anemia and Melena   Subjective HPI:  This is the initial office consult for a 81 year old man sent by Dr. Harrold Donath for anemia and heme positive stool. [name] is a rather reluctant historian, his wife assists as best she can. He seems to have few months of anorexia with at least a 20 pound weight loss that all got worse when he was hospitalized for a nonhealing right foot ulcer. I don't have details of this right now, but it sounds like he was found to have worsening peripheral arterial disease, and underwent a right iliac stent on 01/02/2016. He had been put on Plavix shortly before that when admitted for foot ulcer and infection, and had already been on aspirin. Labs from that hospital stay are not available. All we know is that 2 weeks ago his hemoglobin was 10.9, on 01/02/2016 it was down to 9.2. Dr. Redgie Grayer office note indicates the patient declined a rectal exam. Today, the patient denies abdominal pain nausea vomiting or dysphagia. He has frequent heartburn and unfortunately continues to smoke. He is concerned that he might be losing blood from colon polyps because he said he had them in the past. Previous colonoscopy was at least 15 years ago no report is available. He denies bright red blood per rectum or recent change in bowel habits.   ROS:  Review of Systems  Constitutional: Positive for unexpected weight change. Negative for appetite change.  HENT: Negative for mouth sores and voice change.   Eyes: Negative for pain and redness.  Respiratory: Positive for cough. Negative for shortness of breath.   Cardiovascular: Positive for leg swelling. Negative for chest pain and palpitations.  Genitourinary: Negative for dysuria and hematuria.  Musculoskeletal: Negative for myalgias and arthralgias.  Skin: Positive for  pallor. Negative for rash.  Neurological: Negative for weakness and headaches.  Hematological: Negative for adenopathy.     Past Medical History: Past Medical History  Diagnosis Date  . Hypertension   . Prostate enlargement   . Prostate enlargement   . Peripheral vascular disease ([place])     angioplasty and stenting of right common iliac 01/02/2016, treat right nonhealing foot ulcer  . Right foot pain 12/30/2015    Admitted to [facility] 12/11/2015, discharge 12/14/2015, diagnoses included acute right great toe infection/cellulitis, peripheral vascular disease. ABI demonstrated arterial occlusive disease worse on left. Is referred outpatient to vascular The CT scan documents for copy of his discharge summary. He should follow up with Dr. Ladona Horns, general surgery at [facility]. No dated 02/06/2     Past Surgical History: Past Surgical History  Procedure Laterality Date  . Hydrocele excision  1956  . Peripheral vascular catheterization N/A 01/02/2016    Procedure: Abdominal Aortogram w/Lower Extremity;  Surgeon: Angelia Mould, MD;  Location: [place] [facility];  Service: Cardiovascular;  Laterality: N/A;  . Peripheral vascular catheterization  01/02/2016    Procedure: Peripheral Vascular Intervention;  Surgeon: Angelia Mould, MD;  Location: [place] [facility];  Service: Cardiovascular;;     Family History: Family History  Problem Relation Age of Onset  . Colon cancer Neg Hx     Social History: Social History   Social History  . Marital Status: Married    Spouse Name: Henry Ford  . Number of Children: 2  . Years of Education: N/A   Occupational History  . retired  Social History Main Topics  . Smoking status: Light Tobacco Smoker -- 0.25 packs/day    Types: Cigarettes  . Smokeless tobacco: Never Used  . Alcohol Use: 0.6 oz/week    1 Cans of beer, 0 Standard drinks or equivalent per week  . Drug Use: No  . Sexual Activity: Not Asked   Other  Topics Concern  . None   Social History Narrative    Allergies: No Known Allergies  Outpatient Meds: No current facility-administered medications for this visit.   Current Outpatient Prescriptions  Medication Sig Dispense Refill  . aspirin EC 81 MG tablet Take 81 mg by mouth daily.    . clopidogrel (PLAVIX) 75 MG tablet Take 1 tablet (75 mg total) by mouth daily. 30 tablet 4  . finasteride (PROSCAR) 5 MG tablet Take 5 mg by mouth daily.     . megestrol (MEGACE) 400 MG/10ML suspension Take 10 mLs (400 mg total) by mouth daily. 240 mL 2  . naproxen sodium (ANAPROX) 220 MG tablet Take 220 mg by mouth every 12 (twelve) hours as needed (pain).     . nicotine (NICODERM CQ - DOSED IN MG/24 HOURS) 21 mg/24hr patch Place 1 patch (21 mg total) onto the skin daily. (Patient not taking: Reported on 01/20/2016) 28 patch 0  . terazosin (HYTRIN) 10 MG capsule Take 10 mg by mouth at bedtime. Reported on 01/20/2016     Facility-Administered Medications Ordered in Other Visits  Medication Dose Route Frequency Provider Last Rate Last Dose  . 0.9 %  sodium chloride infusion   Intravenous Continuous Virgel Manifold, MD 50 mL/hr at 01/20/16 1242    . nicotine (NICODERM CQ - dosed in mg/24 hours) patch 21 mg  21 mg Transdermal Daily Virgel Manifold, MD      . pantoprazole (PROTONIX) 80 mg in sodium chloride 0.9 % 100 mL IVPB  80 mg Intravenous Once Virgel Manifold, MD          ___________________________________________________________________ Objective  Exam:  BP 120/60 mmHg  Pulse 72  Ht '5\' 8"'$  (1.727 m)  Wt 158 lb 9.6 oz (71.94 kg)  BMI 24.12 kg/m2  General: this is a chronically ill-appearing elderly man who is pale with poor muscle mass including temporal wasting   Eyes: sclera anicteric, no redness.  Conjunctiva are pale  ENT: oral mucosa moist without lesions, no cervical or supraclavicular lymphadenopathy, good dentition  CV: RRR without murmur, S1/S2, no JVD, no peripheral edema. He has an  orthopedic boot on the right foot, limiting exam  Resp: clear to auscultation bilaterally, normal RR and effort noted  GI: soft, no tenderness. He has a subtle fullness in the upper abdomen, not sure if it is his stomach are slightly enlarged liver., No bruit, with active bowel sounds. No guarding  Skin; warm and dry, no rash or jaundice noted. He is pale with multiple ecchymoses on his hands.  He also has a partially healed skin tear on his left temple which she sustained during a recent fall.  Neuro: awake, alert and oriented x 3. Normal gross motor function and fluent speech  Rectal exam(MA chaperoned): Firm, dark brown, strongly heme positive stool. No palpable abnormalities.  Labs:  Lab Results  Component Value Date   WBC 12.3* 01/20/2016   HGB 8.3* 01/20/2016   HCT 25.1* 01/20/2016   MCV 97.3 01/20/2016   PLT 465* 01/20/2016   Hgb 10.9 two weeks ago Hemoglobin 9.2 on 01/02/2016  Assessment: Encounter Diagnoses  Name Primary?  Marland Kitchen Anemia, unspecified  anemia type Yes  . Heme positive stool   . Abnormal loss of weight   . Anorexia    peripheral arterial disease Tobacco abuse  I'm concerned that Henry Ford has a subacute upper GI bleed over the last few weeks, probably a pre-existing lesion such as a gastric ulcer or malignancy, exacerbated by the addition of Plavix to his previous aspirin. A lower GI source seems less likely. He had an extended office visit today reviewing old records, examining him, obtaining and awaiting for the results of the stat CBC noted above, , and then a very long conversation trying to get him to agree to a plan. Given the further drop in his hemoglobin over the last week, I do not feel it is safe for him to have an outpatient upper endoscopy next week, especially since he is still on aspirin and Plavix and we do not know the safety of stopping either or both of those with a recent arterial stent. He was exceedingly disagreeable in the office today, with  apparent anger and frustration over these multiple issues that have lately become to light, and the entire encounter took 65 minutes. I tried to get him directly admitted to the hospital service, but there were no available beds. Thus we have had to send him to the [facility] emergency department, and I spoke with Dr. Jeneen Rinks, the ED physician. I've also reviewed the case with our PA Jessica.  Plan:  Hospital admission as noted above, possible transfusion if his hemoglobin drops further Upper endoscopy tomorrow or the next day, further plan to follow.  Thank you for the courtesy of this consult.  Please call me with any questions or concerns.  Nelida Meuse III

## 2016-01-20 NOTE — Consult Note (Signed)
Please see note by Dr. Loletha Carrow from earlier today that will serve as full consult note.  Patient admitted through ED after being seen in our office with weight loss, dark stools, and anemia.  Hgb down from 10.9 grams 2 weeks ago to 8.2 grams this AM.  Had dark stools that were heme positive, but not melena on exam in office today.  Suspected subacute UGIB.  He is on ASA 81 mg daily and recently placed on Plavix after having iliac stent placed for PAD in February.  Also questionable NSAID use at home (patient not sure exactly what he was taking OTC).  Ok to keep him on ASA and Plavix.  Will place him on BID IV pantoprazole 40 mg for now.  EGD tomorrow, 3/4, around 1030 AM.  [place] for full liquids today then NPO after midnight.

## 2016-01-20 NOTE — ED Notes (Signed)
Bed: WA21 Expected date:  Expected time:  Means of arrival:  Comments: 

## 2016-01-20 NOTE — H&P (Signed)
[facility] History and Physical  Henry Ford XIP:[id number] DOB: May 29, 1935 DOA: 01/20/2016  Referring physician: ED physician, Dr. Wilson Singer  PCP: [name], DO   Chief Complaint: melena   HPI:  Pt is 81 yo male who is smoker, now s/o angioplasty and stenting of right common iliac artery done 12/2015, on aspirin and plavix, has seen GI doctor earlier in the day and was referred for an admission for further evaluation of heme positive stools, several months anorexia. Pt reports chronic fatigue and poor oral intake, no chest pain or shortness of breath, no fevers, chills, no abd or urinary concerns.   In ED, pt noted to be hemodynamically stable, VSS, blood work notable for Hg 8.3, Cr 1.97. GI consulted and [facility] asked to admit for further evaluation.   Assessment and Plan:  Active Problems:   GIB (gastrointestinal bleeding) - admit to telemetry unit - GI team will follow along, assistance is appreciated - place on Protonix IV BID for now - NPO after midnight  - hold aspirin, plavix, naproxen today and resume as soon as GI team allows     Thrombocytosis ([place]) - reactive most likely - CBC In AM    Acute kidney injury ([place]) - likely pre renal - IVF provided in ED - repeat BMP in AM    Leukocytosis - no clear infectious etiology - monitor closely  - CBC In AM    Smoker - allow nicotine patch   SCD's for DVT prophylaxis    Radiological Exams on Admission: No results found.   Code Status: Full Family Communication: Pt at bedside Disposition Plan: Admit for further evaluation    [facility] Pig[facility] [phone]   Review of Systems:  Constitutional: Negative for diaphoresis.  HENT: Negative for hearing loss, ear pain, nosebleeds, congestion, sore throat, neck pain, tinnitus and ear discharge.   Eyes: Negative for blurred vision, double vision, photophobia, pain, discharge and redness.  Respiratory: Negative for cough, hemoptysis, sputum production, shortness of  breath, wheezing and stridor.   Cardiovascular: Negative for chest pain, palpitations, orthopnea, claudication and leg swelling.  Gastrointestinal: Negative for nausea, vomiting and abdominal pain. Genitourinary: Negative for dysuria, urgency, frequency, hematuria and flank pain.  Musculoskeletal: Negative for myalgias, back pain, joint pain and falls.  Skin: Negative for itching and rash.  Neurological: Negative for dizziness  Endo/Heme/Allergies: Negative for environmental allergies and polydipsia. Does not bruise/bleed easily.  Psychiatric/Behavioral: Negative for suicidal ideas. The patient is not nervous/anxious.      Past Medical History  Diagnosis Date  . Hypertension   . Prostate enlargement   . Prostate enlargement   . Peripheral vascular disease ([place])     angioplasty and stenting of right common iliac 01/02/2016, treat right nonhealing foot ulcer  . Right foot pain 12/30/2015    Admitted to [facility] 12/11/2015, discharge 12/14/2015, diagnoses included acute right great toe infection/cellulitis, peripheral vascular disease. ABI demonstrated arterial occlusive disease worse on left. Is referred outpatient to vascular The CT scan documents for copy of his discharge summary. He should follow up with Dr. Ladona Horns, general surgery at [facility]. No dated 02/06/2    Past Surgical History  Procedure Laterality Date  . Hydrocele excision  1956  . Peripheral vascular catheterization N/A 01/02/2016    Procedure: Abdominal Aortogram w/Lower Extremity;  Surgeon: Angelia Mould, MD;  Location: [place] [facility];  Service: Cardiovascular;  Laterality: N/A;  . Peripheral vascular catheterization  01/02/2016    Procedure: Peripheral Vascular Intervention;  Surgeon: Harrell Gave  Nicole Cella, MD;  Location: [place] [facility];  Service: Cardiovascular;;    Social History:  reports that he has been smoking Cigarettes.  He has been smoking about 0.25 packs per day. He has never  used smokeless tobacco. He reports that he drinks about 0.6 oz of alcohol per week. He reports that he does not use illicit drugs.  No Known Allergies  Family History  Problem Relation Age of Onset  . Colon cancer Neg Hx     Prior to Admission medications   Medication Sig Start Date End Date Taking? Authorizing Provider  aspirin EC 81 MG tablet Take 81 mg by mouth daily.   Yes Historical Provider, MD  clopidogrel (PLAVIX) 75 MG tablet Take 1 tablet (75 mg total) by mouth daily. 01/03/16  Yes Samantha J Rhyne, PA-C  finasteride (PROSCAR) 5 MG tablet Take 5 mg by mouth daily.    Yes Historical Provider, MD  megestrol (MEGACE) 400 MG/10ML suspension Take 10 mLs (400 mg total) by mouth daily. 01/10/16  Yes [name], DO  naproxen sodium (ANAPROX) 220 MG tablet Take 220 mg by mouth every 12 (twelve) hours as needed (pain).    Yes Historical Provider, MD  terazosin (HYTRIN) 10 MG capsule Take 10 mg by mouth at bedtime. Reported on 01/20/2016   Yes Historical Provider, MD  nicotine (NICODERM CQ - DOSED IN MG/24 HOURS) 21 mg/24hr patch Place 1 patch (21 mg total) onto the skin daily. Patient not taking: Reported on 01/20/2016 01/03/16   Gabriel Earing, PA-C    Physical Exam: Filed Vitals:   01/20/16 1112 01/20/16 1150 01/20/16 1322  BP: 111/52 109/70 113/57  Pulse: 95 70 78  Temp: 98.2 F (36.8 C)  98.2 F (36.8 C)  TempSrc: Oral    Resp: 18  12  SpO2: 100% 100% 100%    Physical Exam  Constitutional: Appears well-developed and well-nourished. No distress.  HENT: Normocephalic. External right and left ear normal. Oropharynx is clear and moist.  Eyes: Conjunctivae and EOM are normal. PERRLA, no scleral icterus.  Neck: Normal ROM. Neck supple. No JVD. No tracheal deviation. No thyromegaly.  CVS: RRR, S1/S2 +, no murmurs, no gallops, no carotid bruit.  Pulmonary: Effort and breath sounds normal, diminished breath sounds at bases  Abdominal: Soft. BS +,  no distension, tenderness,  rebound or guarding.  Musculoskeletal: Normal range of motion. Lymphadenopathy: No lymphadenopathy noted, cervical, inguinal. Neuro: Alert. Normal reflexes, muscle tone coordination. No cranial nerve deficit. Skin: Skin is warm and dry. No rash noted. Not diaphoretic. No erythema. No pallor.  Psychiatric: Normal mood and affect.  Labs on Admission:  Basic Metabolic Panel:  Recent Labs Lab 01/20/16 1129  NA 140  K 4.7  CL 108  CO2 22  GLUCOSE 110*  BUN 28*  CREATININE 1.97*  CALCIUM 8.3*   Liver Function Tests:  Recent Labs Lab 01/20/16 1129  AST 21  ALT 13*  ALKPHOS 60  BILITOT 0.4  PROT 6.1*  ALBUMIN 3.1*   CBC:  Recent Labs Lab 01/20/16 0929 01/20/16 1129  WBC 12.9* 12.3*  NEUTROABS 7.8*  --   HGB 8.2 Repeated and verified X2.* 8.3*  HCT 24.5 Repeated and verified X2.* 25.1*  MCV 94.6 97.3  PLT 481.0* 465*   EKG: pending   If 7PM-7AM, please contact night-coverage [email or web address] Password [email or web address] 01/20/2016, 1:56 PM

## 2016-01-20 NOTE — ED Provider Notes (Signed)
CSN: [id number]     Arrival date & time 01/20/16  1106 History   First MD Initiated Contact with Patient 01/20/16 1124     Chief Complaint  Patient presents with  . Rectal Bleeding     (Consider location/radiation/quality/duration/timing/severity/associated sxs/prior Treatment) HPI   81 year old male referred from gastroenterology office over concern for symptomatic anemia secondary to GI bleed. Patient has felt generally weak/fatigue over the past several days to weeks. Has had several dark/melanotic stools. He is felt weak to the point of falling twice. Denies any specific dyspnea. No abdominal pain. No fevers or chills. He is on aspirin and Plavix.    Past Medical History  Diagnosis Date  . Hypertension   . Prostate enlargement   . Prostate enlargement   . Peripheral vascular disease ([place])     angioplasty and stenting of right common iliac 01/02/2016, treat right nonhealing foot ulcer  . Right foot pain 12/30/2015    Admitted to [facility] 12/11/2015, discharge 12/14/2015, diagnoses included acute right great toe infection/cellulitis, peripheral vascular disease. ABI demonstrated arterial occlusive disease worse on left. Is referred outpatient to vascular The CT scan documents for copy of his discharge summary. He should follow up with Dr. Ladona Horns, general surgery at [facility]. No dated 02/06/2   Past Surgical History  Procedure Laterality Date  . Hydrocele excision  1956  . Peripheral vascular catheterization N/A 01/02/2016    Procedure: Abdominal Aortogram w/Lower Extremity;  Surgeon: Angelia Mould, MD;  Location: [place] [facility];  Service: Cardiovascular;  Laterality: N/A;  . Peripheral vascular catheterization  01/02/2016    Procedure: Peripheral Vascular Intervention;  Surgeon: Angelia Mould, MD;  Location: [place] [facility];  Service: Cardiovascular;;   Family History  Problem Relation Age of Onset  . Colon cancer Neg Hx    Social History   Substance Use Topics  . Smoking status: Light Tobacco Smoker -- 0.25 packs/day    Types: Cigarettes  . Smokeless tobacco: Never Used  . Alcohol Use: 0.6 oz/week    1 Cans of beer, 0 Standard drinks or equivalent per week    Review of Systems  All systems reviewed and negative, other than as noted in HPI.   Allergies  Review of patient's allergies indicates no known allergies.  Home Medications   Prior to Admission medications   Medication Sig Start Date End Date Taking? Authorizing Provider  aspirin EC 81 MG tablet Take 81 mg by mouth daily.   Yes Historical Provider, MD  clopidogrel (PLAVIX) 75 MG tablet Take 1 tablet (75 mg total) by mouth daily. 01/03/16  Yes Samantha J Rhyne, PA-C  finasteride (PROSCAR) 5 MG tablet Take 5 mg by mouth daily.    Yes Historical Provider, MD  megestrol (MEGACE) 400 MG/10ML suspension Take 10 mLs (400 mg total) by mouth daily. 01/10/16  Yes [name], DO  naproxen sodium (ANAPROX) 220 MG tablet Take 220 mg by mouth every 12 (twelve) hours as needed (pain).    Yes Historical Provider, MD  terazosin (HYTRIN) 10 MG capsule Take 10 mg by mouth at bedtime. Reported on 01/20/2016   Yes Historical Provider, MD  nicotine (NICODERM CQ - DOSED IN MG/24 HOURS) 21 mg/24hr patch Place 1 patch (21 mg total) onto the skin daily. Patient not taking: Reported on 01/20/2016 01/03/16   Aldona Bar J Rhyne, PA-C   BP 109/70 mmHg  Pulse 70  Temp(Src) 98.2 F (36.8 C) (Oral)  Resp 18  SpO2 100% Physical Exam  Constitutional:  He appears well-developed and well-nourished. No distress.  HENT:  Head: Normocephalic and atraumatic.  Eyes: Conjunctivae are normal. Right eye exhibits no discharge. Left eye exhibits no discharge.  Neck: Neck supple.  Cardiovascular: Normal rate, regular rhythm and normal heart sounds.  Exam reveals no gallop and no friction rub.   No murmur heard. Pulmonary/Chest: Effort normal and breath sounds normal. No respiratory distress.   Abdominal: Soft. He exhibits no distension. There is no tenderness.  Musculoskeletal: He exhibits no edema or tenderness.  Neurological: He is alert.  Skin: Skin is warm and dry.  Psychiatric: He has a normal mood and affect. His behavior is normal. Thought content normal.  Nursing note and vitals reviewed.   ED Course  Procedures (including critical care time) Labs Review Labs Reviewed  COMPREHENSIVE METABOLIC PANEL - Abnormal; Notable for the following:    Glucose, Bld 110 (*)    BUN 28 (*)    Creatinine, Ser 1.97 (*)    Calcium 8.3 (*)    Total Protein 6.1 (*)    Albumin 3.1 (*)    ALT 13 (*)    GFR calc non Af Amer 31 (*)    GFR calc Af Amer 36 (*)    All other components within normal limits  CBC - Abnormal; Notable for the following:    WBC 12.3 (*)    RBC 2.58 (*)    Hemoglobin 8.3 (*)    HCT 25.1 (*)    Platelets 465 (*)    All other components within normal limits  BASIC METABOLIC PANEL - Abnormal; Notable for the following:    Chloride 114 (*)    BUN 22 (*)    Creatinine, Ser 1.48 (*)    Calcium 7.9 (*)    GFR calc non Af Amer 44 (*)    GFR calc Af Amer 50 (*)    All other components within normal limits  CBC - Abnormal; Notable for the following:    RBC 2.07 (*)    Hemoglobin 6.7 (*)    HCT 20.1 (*)    All other components within normal limits  CBC - Abnormal; Notable for the following:    RBC 3.07 (*)    Hemoglobin 9.7 (*)    HCT 29.4 (*)    All other components within normal limits  HEMOGLOBIN AND HEMATOCRIT, BLOOD - Abnormal; Notable for the following:    Hemoglobin 9.5 (*)    HCT 28.4 (*)    All other components within normal limits  BASIC METABOLIC PANEL - Abnormal; Notable for the following:    Chloride 116 (*)    CO2 18 (*)    Creatinine, Ser 1.32 (*)    Calcium 7.3 (*)    GFR calc non Af Amer 50 (*)    GFR calc Af Amer 58 (*)    All other components within normal limits  CBC - Abnormal; Notable for the following:    RBC 3.04 (*)     Hemoglobin 9.6 (*)    HCT 29.1 (*)    All other components within normal limits  BASIC METABOLIC PANEL - Abnormal; Notable for the following:    Chloride 114 (*)    CO2 21 (*)    Creatinine, Ser 1.42 (*)    Calcium 7.5 (*)    GFR calc non Af Amer 46 (*)    GFR calc Af Amer 53 (*)    All other components within normal limits  PROTIME-INR  POC OCCULT BLOOD, ED  TYPE AND SCREEN  ABO/RH  PREPARE RBC (CROSSMATCH)    Imaging Review No results found. I have personally reviewed and evaluated these images and lab results as part of my medical decision-making.   EKG Interpretation None      MDM   Final diagnoses:  Anemia, unspecified anemia type  Gastrointestinal hemorrhage with melena    81 year old male with generalized weakness/fatigue. Progressively worsening. Recent melanotic stool. Patient is declining rectal examination by me. Concern for symptomatic anemia secondary to upper GI bleed. Protonix started. Change fusion deferred with hemoglobin at this level. Will need to be trended. Admission for further evaluation.  Virgel Manifold, MD 01/23/16 [phone]

## 2016-01-20 NOTE — ED Notes (Addendum)
Pt has had problems since beginning of Jan with various health problems, now has blood in stool, was sent over by Dr Loletha Carrow due to anemia. Black formed stools

## 2016-01-20 NOTE — Progress Notes (Signed)
Utilization Review completed.  Skeeter Sheard RN CM  

## 2016-01-21 ENCOUNTER — Encounter (HOSPITAL_COMMUNITY)
Admission: EM | Disposition: A | Discharge status: Home / Self Care | Payer: Self-pay | Source: Home / Self Care | Attending: Internal Medicine

## 2016-01-21 ENCOUNTER — Encounter (HOSPITAL_COMMUNITY): Payer: Self-pay | Admitting: Internal Medicine

## 2016-01-21 DIAGNOSIS — K222 Esophageal obstruction: Secondary | ICD-10-CM | POA: Insufficient documentation

## 2016-01-21 DIAGNOSIS — K264 Chronic or unspecified duodenal ulcer with hemorrhage: Principal | ICD-10-CM

## 2016-01-21 DIAGNOSIS — K2210 Ulcer of esophagus without bleeding: Secondary | ICD-10-CM

## 2016-01-21 DIAGNOSIS — K921 Melena: Secondary | ICD-10-CM | POA: Insufficient documentation

## 2016-01-21 DIAGNOSIS — D649 Anemia, unspecified: Secondary | ICD-10-CM

## 2016-01-21 HISTORY — PX: ESOPHAGOGASTRODUODENOSCOPY: SHX5428

## 2016-01-21 LAB — CBC
HCT: 20.1 % — ABNORMAL LOW (ref 39.0–52.0)
Hemoglobin: 6.7 g/dL — CL (ref 13.0–17.0)
MCH: 32.4 pg (ref 26.0–34.0)
MCHC: 33.3 g/dL (ref 30.0–36.0)
MCV: 97.1 fL (ref 78.0–100.0)
PLATELETS: 395 10*3/uL (ref 150–400)
RBC: 2.07 MIL/uL — AB (ref 4.22–5.81)
RDW: 14.8 % (ref 11.5–15.5)
WBC: 9.1 10*3/uL (ref 4.0–10.5)

## 2016-01-21 LAB — BASIC METABOLIC PANEL
Anion gap: 7 (ref 5–15)
BUN: 22 mg/dL — AB (ref 6–20)
CHLORIDE: 114 mmol/L — AB (ref 101–111)
CO2: 22 mmol/L (ref 22–32)
CREATININE: 1.48 mg/dL — AB (ref 0.61–1.24)
Calcium: 7.9 mg/dL — ABNORMAL LOW (ref 8.9–10.3)
GFR calc non Af Amer: 44 mL/min — ABNORMAL LOW (ref >60)
GFR, EST AFRICAN AMERICAN: 50 mL/min — AB (ref >60)
GLUCOSE: 81 mg/dL (ref 65–99)
Potassium: 4.2 mmol/L (ref 3.5–5.1)
Sodium: 143 mmol/L (ref 135–145)

## 2016-01-21 LAB — HEMOGLOBIN AND HEMATOCRIT, BLOOD
HEMATOCRIT: 28.4 % — AB (ref 39.0–52.0)
Hemoglobin: 9.5 g/dL — ABNORMAL LOW (ref 13.0–17.0)

## 2016-01-21 LAB — PREPARE RBC (CROSSMATCH)

## 2016-01-21 SURGERY — EGD (ESOPHAGOGASTRODUODENOSCOPY)
Anesthesia: Moderate Sedation

## 2016-01-21 MED ORDER — FENTANYL CITRATE (PF) 100 MCG/2ML IJ SOLN
INTRAMUSCULAR | Status: AC
  Filled 2016-01-21: qty 4

## 2016-01-21 MED ORDER — MIDAZOLAM HCL 10 MG/2ML IJ SOLN
INTRAMUSCULAR | Status: DC | PRN
  Administered 2016-01-21: 2 mg via INTRAVENOUS
  Administered 2016-01-21: 1 mg via INTRAVENOUS
  Administered 2016-01-21: 2 mg via INTRAVENOUS

## 2016-01-21 MED ORDER — MIDAZOLAM HCL 5 MG/ML IJ SOLN
INTRAMUSCULAR | Status: AC
  Filled 2016-01-21: qty 3

## 2016-01-21 MED ORDER — BUTAMBEN-TETRACAINE-BENZOCAINE 2-2-14 % EX AERO
INHALATION_SPRAY | CUTANEOUS | Status: DC | PRN
  Administered 2016-01-21: 2 via TOPICAL

## 2016-01-21 MED ORDER — DIPHENHYDRAMINE HCL 50 MG/ML IJ SOLN
INTRAMUSCULAR | Status: AC
  Filled 2016-01-21: qty 1

## 2016-01-21 MED ORDER — FENTANYL CITRATE (PF) 100 MCG/2ML IJ SOLN
INTRAMUSCULAR | Status: DC | PRN
  Administered 2016-01-21 (×2): 25 ug via INTRAVENOUS

## 2016-01-21 MED ORDER — SODIUM CHLORIDE 0.9 % IV SOLN: Freq: Once | INTRAVENOUS | Status: AC

## 2016-01-21 NOTE — Progress Notes (Signed)
Pt has returned from EGD. Alert and oriented. No complaints.

## 2016-01-21 NOTE — Progress Notes (Signed)
Patient ID: Henry Ford, male   DOB: Mar 06, 1935, 81 y.o.   MRN: [id number]  [facility] PROGRESS NOTE  Zahki Hoogendoorn JTT:[id number] DOB: 07/09/1935 DOA: 01/20/2016 PCP: [name], DO   Brief narrative:    Pt is 81 yo male who is smoker, now s/o angioplasty and stenting of right common iliac artery done 12/2015, on aspirin and plavix, has seen GI doctor earlier in the day and was referred for an admission for further evaluation of heme positive stools, several months anorexia. Pt reports chronic fatigue and poor oral intake, no chest pain or shortness of breath, no fevers, chills, no abd or urinary concerns.   In ED, pt noted to be hemodynamically stable, VSS, blood work notable for Hg 8.3, Cr 1.97. GI consulted and [facility] asked to admit for further evaluation.    Assessment/Plan:    Active Problems:  GIB (gastrointestinal bleeding) - GI team following, assistance is appreciated - HG drop overnight - transfuse 2 U PRBC - continue Protonix IV BID for now - continue to hold aspirin, plavix, naproxen today and resume as soon as GI team allows    Thrombocytosis ([facility]) - reactive most likely, resolved  - CBC In AM   Acute kidney injury ([place]) - likely pre renal - IVF provided in ED, Cr is trending down  - repeat BMP in AM   Leukocytosis - no clear infectious etiology - resolved  - CBC In AM   Smoker - allow nicotine patch   SCD's for DVT prophylaxis   Code Status: Full.  Family Communication:  plan of care discussed with the patient Disposition Plan: Home when cleared by GI team  IV access:  Peripheral IV  Procedures and diagnostic studies:    No results found.  Medical Consultants:  GI  Other Consultants:  None  IAnti-Infectives:   None  [name], MD  [facility] Pager [phone]  If 7PM-7AM, please contact night-coverage [email or web address] Password [email or web address] 01/21/2016, 8:30 AM   LOS: 1 day   HPI/Subjective: No events overnight.    Objective: Filed Vitals:   01/20/16 1442 01/20/16 1557 01/20/16 2016 01/21/16 0553  BP:  129/56 106/69 107/63  Pulse: 43 84 116 74  Temp:  98.5 F (36.9 C) 98.7 F (37.1 C) 98.5 F (36.9 C)  TempSrc:  Oral Oral Oral  Resp: '16 16 16 16  '$ Height:  '5\' 8"'$  (1.727 m)    Weight:  71.9 kg (158 lb 8.2 oz)    SpO2: 99% 100% 99% 100%    Intake/Output Summary (Last 24 hours) at 01/21/16 0830 Last data filed at 01/21/16 0554  Gross per 24 hour  Intake      0 ml  Output    550 ml  Net   -550 ml    Exam:   General:  Pt is alert, follows commands appropriately, not in acute distress  Cardiovascular: Regular rate and rhythm, no rubs, no gallops  Respiratory: Clear to auscultation bilaterally, no wheezing, no crackles, no rhonchi  Abdomen: Soft, non tender, non distended, bowel sounds present, no guarding  Data Reviewed: Basic Metabolic Panel:  Recent Labs Lab 01/20/16 1129 01/21/16 0622  NA 140 143  K 4.7 4.2  CL 108 114*  CO2 22 22  GLUCOSE 110* 81  BUN 28* 22*  CREATININE 1.97* 1.48*  CALCIUM 8.3* 7.9*   Liver Function Tests:  Recent Labs Lab 01/20/16 1129  AST 21  ALT 13*  ALKPHOS 60  BILITOT 0.4  PROT 6.1*  ALBUMIN 3.1*  CBC:  Recent Labs Lab 01/20/16 0929 01/20/16 1129 01/21/16 0622  WBC 12.9* 12.3* 9.1  NEUTROABS 7.8*  --   --   HGB 8.2 Repeated and verified X2.* 8.3* 6.7*  HCT 24.5 Repeated and verified X2.* 25.1* 20.1*  MCV 94.6 97.3 97.1  PLT 481.0* 465* 395   Scheduled Meds: . sodium chloride   Intravenous Once  . finasteride  5 mg Oral Daily  . nicotine  21 mg Transdermal Daily  . pantoprazole (PROTONIX) IV  40 mg Intravenous Q12H  . terazosin  10 mg Oral QHS   Continuous Infusions: . sodium chloride 75 mL/hr at 01/20/16 1409  . sodium chloride

## 2016-01-21 NOTE — Op Note (Signed)
[facility] [place], [address]   ENDOSCOPY PROCEDURE REPORT  PATIENT: Henry Ford, Henry Ford  MR#: [id number] BIRTHDATE: 1935/07/20 , 80  yrs. old GENDER: male ENDOSCOPIST: Eustace Quail, MD REFERRED BY:  [facility] PROCEDURE DATE:  01/21/2016 PROCEDURE:  EGD w/ biopsy ASA CLASS:     Class III INDICATIONS:  melena. MEDICATIONS: Fentanyl 50 mcg IV and Versed 5 mg IV TOPICAL ANESTHETIC: Cetacaine Spray DESCRIPTION OF PROCEDURE: After the risks benefits and alternatives of the procedure were thoroughly explained, informed consent was obtained.  The Pentax Gastroscope [id number] endoscope was introduced through the mouth and advanced to the second portion of the duodenum , Without limitations.  The instrument was slowly withdrawn as the mucosa was fully examined.  EXAM:The esophagus revealed a 1.5 cm clean-based ulcer at the gastroesophageal junction consistent with peptic origin.  There is no bleeding or stigmata.  There was an adjacent benign peptic stricture measuring 15 mm in diameter.  The stomach revealed a small sliding hiatal hernia but was otherwise normal.  The duodenum revealed two1.5 cm clean-based ulcers, without bleedingor stigmata, located in the bulb and bulb/D2 junction.  Post bulbar duodenum beyond was normal.  CLO biopsy taken.  Retroflexed views revealed a hiatal hernia.     The scope was then withdrawn from the patient and the procedure completed.  COMPLICATIONS: There were no immediate complications.  ENDOSCOPIC IMPRESSION: 1. Large esophageal ulcer without stigmata or active bleeding. Adjacent peptic stricture 2.Clean-based duodenal ulcers likely responsible for melena and acute blood loss anemia. No stigmata. No active bleeding  RECOMMENDATIONS: 1.  Continue pantoprazole 40 mg IV every 12 hours 2.  Okay to resume Plavix and aspirin for recently placed peripheral stents, if necessary (you can check with vascular surgery  if uncertain) 3.  Avoid NSAIDS otherwise and stop smoking 4.  Clear liquid diet, finish current transfusions, monitor blood counts, stools, and vital signs for evidence of recurrent acute bleeding. GI will follow  REPEAT EXAM:  eSigned:  Eustace Quail, MD 01/21/2016 12:24 PM    CC:The Patient, Jessy Oto.  Oneida Alar, MD, and Wilfrid Lund, MD

## 2016-01-21 NOTE — Progress Notes (Signed)
          Daily Rounding Note  01/21/2016, 8:46 AM  LOS: 1 day   SUBJECTIVE:       No complaints  OBJECTIVE:         Vital signs in last 24 hours:    Temp:  [98.2 F (36.8 C)-98.7 F (37.1 C)] 98.5 F (36.9 C) (03/04 0553) Pulse Rate:  [43-116] 74 (03/04 0553) Resp:  [12-18] 16 (03/04 0553) BP: (105-129)/(52-70) 107/63 mmHg (03/04 0553) SpO2:  [91 %-100 %] 100 % (03/04 0553) Weight:  [71.9 kg (158 lb 8.2 oz)-71.94 kg (158 lb 9.6 oz)] 71.9 kg (158 lb 8.2 oz) (03/03 1557) Last BM Date:  (PTA per pt) Filed Weights   01/20/16 1557  Weight: 71.9 kg (158 lb 8.2 oz)   General: frail looking, comfortable aged WM. Bruises on face Heart: regular, no MRG Chest: clear bil.  No labored resps or cough Abdomen: soft, NT, ND.  Active BS.    Extremities: no CCE Neuro/Psych:  Oriented x 3.  Moves all 4s  Intake/Output from previous day: 03/03 0701 - 03/04 0700 In: -  Out: 550 [Urine:550]  Intake/Output this shift:    Lab Results:  Recent Labs  01/20/16 0929 01/20/16 1129 01/21/16 0622  WBC 12.9* 12.3* 9.1  HGB 8.2 Repeated and verified X2.* 8.3* 6.7*  HCT 24.5 Repeated and verified X2.* 25.1* 20.1*  PLT 481.0* 465* 395   BMET  Recent Labs  01/20/16 1129 01/21/16 0622  NA 140 143  K 4.7 4.2  CL 108 114*  CO2 22 22  GLUCOSE 110* 81  BUN 28* 22*  CREATININE 1.97* 1.48*  CALCIUM 8.3* 7.9*   LFT  Recent Labs  01/20/16 1129  PROT 6.1*  ALBUMIN 3.1*  AST 21  ALT 13*  ALKPHOS 60  BILITOT 0.4   PT/INR  Recent Labs  01/20/16 1129  LABPROT 14.2  INR 1.08   Hepatitis Panel No results for input(s): HEPBSAG, HCVAB, HEPAIGM, HEPBIGM in the last 72 hours.  Studies/Results: No results found.   Scheduled Meds: . sodium chloride   Intravenous Once  . finasteride  5 mg Oral Daily  . nicotine  21 mg Transdermal Daily  . pantoprazole (PROTONIX) IV  40 mg Intravenous Q12H  . terazosin  10 mg Oral QHS    Continuous Infusions: . sodium chloride 75 mL/hr at 01/20/16 1409  . sodium chloride     PRN Meds:.ondansetron **OR** ondansetron (ZOFRAN) IV   ASSESMENT:   *  Anemia.  Dark stools. Hgb drop to 6.7 this AM, PRBCs x 2 ordered.  2 IV lines present so first PRBC will be running before/duirng EGD.   Remote colonoscopy with polyp ~ 2001.     *  Plavix/ASA chronically post iliac stent 213/17 and hx non-healiing right hallux ulcer.  No studies to give US guidance as to presence of CAD/cardiac/pulmonary function but suspect has some underlying cardio/pulmonary disease.   *  CKD. Steady improvement c/w 01/02/16 (89/4.0).  Currently at stage 3b.     PLAN   *  Transfuse the 2 PRBCs.   EGD today.  Will need colonoscopy at some point, sooner if no source for anemia/dark stool found on EGD.     Azucena Freed  01/21/2016, 8:46 AM Pager: [phone]  GI ATTENDING  Interval history data reviewed. Agree with plans for transfusion. Upper endoscopy today. Further plans to be determined thereafter.  Docia Chuck. Geri Seminole., M.D. [facility] Division of Gastroenterology

## 2016-01-22 DIAGNOSIS — D62 Acute posthemorrhagic anemia: Secondary | ICD-10-CM

## 2016-01-22 DIAGNOSIS — K921 Melena: Secondary | ICD-10-CM

## 2016-01-22 DIAGNOSIS — D509 Iron deficiency anemia, unspecified: Secondary | ICD-10-CM

## 2016-01-22 LAB — CBC
HCT: 29.4 % — ABNORMAL LOW (ref 39.0–52.0)
HEMOGLOBIN: 9.7 g/dL — AB (ref 13.0–17.0)
MCH: 31.6 pg (ref 26.0–34.0)
MCHC: 33 g/dL (ref 30.0–36.0)
MCV: 95.8 fL (ref 78.0–100.0)
Platelets: 355 10*3/uL (ref 150–400)
RBC: 3.07 MIL/uL — ABNORMAL LOW (ref 4.22–5.81)
RDW: 14.7 % (ref 11.5–15.5)
WBC: 7.6 10*3/uL (ref 4.0–10.5)

## 2016-01-22 LAB — BASIC METABOLIC PANEL
Anion gap: 7 (ref 5–15)
BUN: 16 mg/dL (ref 6–20)
CHLORIDE: 116 mmol/L — AB (ref 101–111)
CO2: 18 mmol/L — ABNORMAL LOW (ref 22–32)
CREATININE: 1.32 mg/dL — AB (ref 0.61–1.24)
Calcium: 7.3 mg/dL — ABNORMAL LOW (ref 8.9–10.3)
GFR calc Af Amer: 58 mL/min — ABNORMAL LOW (ref >60)
GFR calc non Af Amer: 50 mL/min — ABNORMAL LOW (ref >60)
Glucose, Bld: 82 mg/dL (ref 65–99)
Potassium: 4.4 mmol/L (ref 3.5–5.1)
SODIUM: 141 mmol/L (ref 135–145)

## 2016-01-22 MED ORDER — CLOPIDOGREL BISULFATE 75 MG PO TABS: 75.0000 mg | ORAL_TABLET | Freq: Every day | ORAL | Status: DC

## 2016-01-22 MED ORDER — FUROSEMIDE 20 MG PO TABS
20.0000 mg | ORAL_TABLET | Freq: Once | ORAL | Status: AC
  Administered 2016-01-22: 20 mg via ORAL
  Filled 2016-01-22: qty 1

## 2016-01-22 MED ORDER — PANTOPRAZOLE SODIUM 40 MG PO TBEC
40.0000 mg | DELAYED_RELEASE_TABLET | Freq: Two times a day (BID) | ORAL | Status: DC
  Administered 2016-01-22 – 2016-01-23 (×3): 40 mg via ORAL
  Filled 2016-01-22 (×4): qty 1

## 2016-01-22 MED ORDER — CLOPIDOGREL BISULFATE 75 MG PO TABS
75.0000 mg | ORAL_TABLET | Freq: Every day | ORAL | Status: DC
  Administered 2016-01-22 – 2016-01-23 (×2): 75 mg via ORAL
  Filled 2016-01-22 (×2): qty 1

## 2016-01-22 MED ORDER — ASPIRIN EC 81 MG PO TBEC
81.0000 mg | DELAYED_RELEASE_TABLET | Freq: Every day | ORAL | Status: DC
  Administered 2016-01-22 – 2016-01-23 (×2): 81 mg via ORAL
  Filled 2016-01-22 (×2): qty 1

## 2016-01-22 MED ORDER — ASPIRIN 81 MG PO CHEW: 81.0000 mg | CHEWABLE_TABLET | Freq: Every day | ORAL | Status: DC

## 2016-01-22 NOTE — Progress Notes (Signed)
Patient ID: Henry Ford, male   DOB: 03/22/1935, 81 y.o.   MRN: [id number]  [facility] PROGRESS NOTE  Henry Ford [id number]:[id number] DOB: September 08, 1935 DOA: 01/20/2016 PCP: [name], DO   Brief narrative:    Pt is 81 yo male who is smoker, now s/o angioplasty and stenting of right common iliac artery done 12/2015, on aspirin and plavix, has seen GI doctor earlier in the day and was referred for an admission for further evaluation of heme positive stools, several months anorexia. Pt reports chronic fatigue and poor oral intake, no chest pain or shortness of breath, no fevers, chills, no abd or urinary concerns.   In ED, pt noted to be hemodynamically stable, VSS, blood work notable for Hg 8.3, Cr 1.97. GI consulted and [facility] asked to admit for further evaluation.   Assessment/Plan:    Active Problems:  GIB (gastrointestinal bleeding) - GI team following, assistance is appreciated  - EGD 01/21/16: Esophageal ulcer with adjacent stricture, not actively bleeding. Clean -based duodenal ulcers (these are likely source of FOBT + anemia) - OK to resume Plavix and ASA - also advance diet and change to PO Protonix BID   Thrombocytosis ([facility]) - reactive most likely, resolved  - CBC In AM   Acute kidney injury ([place]) - likely pre renal - IVF provided in ED, Cr is trending down  - stop IVF as pt with more lower extremity swelling  - repeat BMP in AM   Leukocytosis - no clear infectious etiology - resolved  - CBC In AM   Smoker - allow nicotine patch   SCD's for DVT prophylaxis   Code Status: Full.  Family Communication:  plan of care discussed with the patient Disposition Plan: Home 3/6  IV access:  Peripheral IV  Procedures and diagnostic studies:    No results found.  Medical Consultants:  GI  Other Consultants:  None  IAnti-Infectives:   None  [name], MD  [facility] Pager [phone]  If 7PM-7AM, please contact  night-coverage [email or web address] Password [facility] 01/22/2016, 12:56 PM   LOS: 2 days   HPI/Subjective: No events overnight.   Objective: Filed Vitals:   01/21/16 1415 01/21/16 1722 01/21/16 2248 01/22/16 0632  BP: 102/37 123/55 122/66 119/84  Pulse: 78 82 50 76  Temp: 98.4 F (36.9 C) 98.2 F (36.8 C) 99.3 F (37.4 C) 98.8 F (37.1 C)  TempSrc: Oral Oral Oral Oral  Resp: '16 16 16 16  '$ Height:      Weight:      SpO2: 98% 99% 98% 98%    Intake/Output Summary (Last 24 hours) at 01/22/16 1256 Last data filed at 01/22/16 1115  Gross per 24 hour  Intake   2325 ml  Output   1401 ml  Net    924 ml    Exam:   General:  Pt is alert, follows commands appropriately, not in acute distress  Cardiovascular: Regular rate and rhythm, no rubs, no gallops  Respiratory: Clear to auscultation bilaterally, no wheezing, no crackles, no rhonchi  Abdomen: Soft, non tender, non distended, bowel sounds present, no guarding  Data Reviewed: Basic Metabolic Panel:  Recent Labs Lab 01/20/16 1129 01/21/16 0622 01/22/16 0618  NA 140 143 141  K 4.7 4.2 4.4  CL 108 114* 116*  CO2 22 22 18*  GLUCOSE 110* 81 82  BUN 28* 22* 16  CREATININE 1.97* 1.48* 1.32*  CALCIUM 8.3* 7.9* 7.3*   Liver Function Tests:  Recent Labs Lab 01/20/16 1129  AST 21  ALT 13*  ALKPHOS 60  BILITOT 0.4  PROT 6.1*  ALBUMIN 3.1*   CBC:  Recent Labs Lab 01/20/16 0929 01/20/16 1129 01/21/16 0622 01/21/16 1905 01/22/16 0618  WBC 12.9* 12.3* 9.1  --  7.6  NEUTROABS 7.8*  --   --   --   --   HGB 8.2 Repeated and verified X2.* 8.3* 6.7* 9.5* 9.7*  HCT 24.5 Repeated and verified X2.* 25.1* 20.1* 28.4* 29.4*  MCV 94.6 97.3 97.1  --  95.8  PLT 481.0* 465* 395  --  355   Scheduled Meds: . aspirin EC  81 mg Oral Daily  . clopidogrel  75 mg Oral Daily  . finasteride  5 mg Oral Daily  . furosemide  20 mg Oral Once  . nicotine  21 mg Transdermal Daily  . pantoprazole  40 mg Oral BID  . terazosin  10 mg Oral  QHS   Continuous Infusions:

## 2016-01-22 NOTE — Progress Notes (Signed)
Daily Rounding Note  01/22/2016, 8:33 AM  LOS: 2 days   SUBJECTIVE:       Bigeminy yesterday and this AM.  Swelling in legs.   No dyspnea.  Stool brown. Wants to go home.  Tolerating clears  OBJECTIVE:         Vital signs in last 24 hours:    Temp:  [98.1 F (36.7 C)-99.3 F (37.4 C)] 98.8 F (37.1 C) (03/05 0938) Pulse Rate:  [36-82] 76 (03/05 0632) Resp:  [13-22] 16 (03/05 1829) BP: (102-130)/(29-108) 119/84 mmHg (03/05 0632) SpO2:  [97 %-100 %] 98 % (03/05 0632) Last BM Date: 01/21/16 Filed Weights   01/20/16 1557  Weight: 71.9 kg (158 lb 8.2 oz)   General: aged, looks soewhat chronically ill.  comfortable   Heart: irregular.  Multiform PVCs on tele.  Chest: clear bil.  Somewhat reduced BS.  No dyspnea or cough. Abdomen: soft, NT, ND  Extremities: 1 plus edema in feet/ankles.  Arthritic changes in hands.  Purpura on arms Neuro/Psych:  Oriented x 3.  Moves all 4s.    Intake/Output from previous day: 03/04 0701 - 03/05 0700 In: 2910 [P.O.:840; I.V.:1400; Blood:670] Out: 1251 [Urine:1250; Stool:1]  Intake/Output this shift:    Lab Results:  Recent Labs  01/20/16 1129 01/21/16 0622 01/21/16 1905 01/22/16 0618  WBC 12.3* 9.1  --  7.6  HGB 8.3* 6.7* 9.5* 9.7*  HCT 25.1* 20.1* 28.4* 29.4*  PLT 465* 395  --  355   BMET  Recent Labs  01/20/16 1129 01/21/16 0622 01/22/16 0618  NA 140 143 141  K 4.7 4.2 4.4  CL 108 114* 116*  CO2 22 22 18*  GLUCOSE 110* 81 82  BUN 28* 22* 16  CREATININE 1.97* 1.48* 1.32*  CALCIUM 8.3* 7.9* 7.3*   LFT  Recent Labs  01/20/16 1129  PROT 6.1*  ALBUMIN 3.1*  AST 21  ALT 13*  ALKPHOS 60  BILITOT 0.4   PT/INR  Recent Labs  01/20/16 1129  LABPROT 14.2  INR 1.08   Hepatitis Panel No results for input(s): HEPBSAG, HCVAB, HEPAIGM, HEPBIGM in the last 72 hours.  Studies/Results: No results found.  Scheduled Meds: . finasteride  5 mg Oral Daily  .  nicotine  21 mg Transdermal Daily  . pantoprazole (PROTONIX) IV  40 mg Intravenous Q12H  . terazosin  10 mg Oral QHS   Continuous Infusions: . sodium chloride 75 mL/hr at 01/20/16 1409   PRN Meds:.ondansetron **OR** ondansetron (ZOFRAN) IV  ASSESMENT:   * Anemia. Dark stools. Hgb drop to 6.7 this AM. Hgb improved s/p PRBCs x 2. Remote colonoscopy with polyp ~ 2001.  EGD 01/21/16: Esophageal ulcer with adjacent stricture, not actively bleeding.   Clean -based duodenal ulcers (these are likely source of FOBT + anemia).  Awaiting clotest results. On BID IV Protonix.   * Plavix/ASA chronically post iliac stent 213/17 and hx non-healiing right hallux ulcer.   No studies to give US guidance as to presence of CAD/cardiac/pulmonary function but suspect has some underlying cardio/pulmonary disease.  Note bigeminy/PVCs on tele.  No echo, CXR in records.  .   * CKD. Steady improvement c/w 01/02/16 (89/4.0). Improved to stage 3a.  Marland Kitchen    PLAN   *  Resume Plavix/81 ASA.   Changed to bid po Protonix (BID for one month, then drop to q day) GI follow up with Dr Loletha Carrow: email sent to him.    *  Advanced to [facility] diet.     Azucena Freed  01/22/2016, 8:33 AM Pager: [phone]  GI ATTENDING  Interval history data reviewed. Agree with above progress note. Would keep in hospital 1 additional day. Patient and wife explained about his ulcer disease. Okay for aspirin and Plavix because of recent peripheral vascular stent. No additional NSAIDs. Stop smoking. Twice a day PPI until further notice. Follow-up CLO testing. Advanced diet. If stable in a.m., okay to discharge with GI follow-up. Please call us if there are interval questions or problems. Will sign off. Thank you  Docia Chuck. Geri Seminole., M.D. [facility] Division of Gastroenterology

## 2016-01-23 ENCOUNTER — Telehealth: Payer: Self-pay | Admitting: Emergency Medicine

## 2016-01-23 ENCOUNTER — Encounter (HOSPITAL_COMMUNITY): Payer: Self-pay | Admitting: Internal Medicine

## 2016-01-23 ENCOUNTER — Telehealth: Payer: Self-pay | Admitting: Gastroenterology

## 2016-01-23 LAB — TYPE AND SCREEN
ABO/RH(D): O NEG
ANTIBODY SCREEN: NEGATIVE
UNIT DIVISION: 0
Unit division: 0

## 2016-01-23 LAB — CBC
HEMATOCRIT: 29.1 % — AB (ref 39.0–52.0)
Hemoglobin: 9.6 g/dL — ABNORMAL LOW (ref 13.0–17.0)
MCH: 31.6 pg (ref 26.0–34.0)
MCHC: 33 g/dL (ref 30.0–36.0)
MCV: 95.7 fL (ref 78.0–100.0)
Platelets: 368 10*3/uL (ref 150–400)
RBC: 3.04 MIL/uL — ABNORMAL LOW (ref 4.22–5.81)
RDW: 14.8 % (ref 11.5–15.5)
WBC: 8.8 10*3/uL (ref 4.0–10.5)

## 2016-01-23 LAB — BASIC METABOLIC PANEL
Anion gap: 5 (ref 5–15)
BUN: 14 mg/dL (ref 6–20)
CALCIUM: 7.5 mg/dL — AB (ref 8.9–10.3)
CO2: 21 mmol/L — AB (ref 22–32)
CREATININE: 1.42 mg/dL — AB (ref 0.61–1.24)
Chloride: 114 mmol/L — ABNORMAL HIGH (ref 101–111)
GFR calc non Af Amer: 46 mL/min — ABNORMAL LOW (ref >60)
GFR, EST AFRICAN AMERICAN: 53 mL/min — AB (ref >60)
GLUCOSE: 88 mg/dL (ref 65–99)
Potassium: 3.9 mmol/L (ref 3.5–5.1)
Sodium: 140 mmol/L (ref 135–145)

## 2016-01-23 MED ORDER — PANTOPRAZOLE SODIUM 40 MG PO TBEC: 40.0000 mg | DELAYED_RELEASE_TABLET | Freq: Two times a day (BID) | ORAL | Status: DC

## 2016-01-23 NOTE — Telephone Encounter (Signed)
Pt has been scheduled for f/u and instructed to take iron twice daily.  He was also told to stop ASA now and stay on plavix.  He will have labs at PCP next week

## 2016-01-23 NOTE — Discharge Instructions (Signed)
Anemia, Nonspecific Anemia is a condition in which the concentration of red blood cells or hemoglobin in the blood is below normal. Hemoglobin is a substance in red blood cells that carries oxygen to the tissues of the body. Anemia results in not enough oxygen reaching these tissues.  CAUSES  Common causes of anemia include:   Excessive bleeding. Bleeding may be internal or external. This includes excessive bleeding from periods (in women) or from the intestine.   Poor nutrition.   Chronic kidney, thyroid, and liver disease.  Bone marrow disorders that decrease red blood cell production.  Cancer and treatments for cancer.  HIV, AIDS, and their treatments.  Spleen problems that increase red blood cell destruction.  Blood disorders.  Excess destruction of red blood cells due to infection, medicines, and autoimmune disorders. SIGNS AND SYMPTOMS   Minor weakness.   Dizziness.   Headache.  Palpitations.   Shortness of breath, especially with exercise.   Paleness.  Cold sensitivity.  Indigestion.  Nausea.  Difficulty sleeping.  Difficulty concentrating. Symptoms may occur suddenly or they may develop slowly.  DIAGNOSIS  Additional blood tests are often needed. These help your health care provider determine the best treatment. Your health care provider will check your stool for blood and look for other causes of blood loss.  TREATMENT  Treatment varies depending on the cause of the anemia. Treatment can include:   Supplements of iron, vitamin B12, or folic acid.   Hormone medicines.   A blood transfusion. This may be needed if blood loss is severe.   Hospitalization. This may be needed if there is significant continual blood loss.   Dietary changes.  Spleen removal. HOME CARE INSTRUCTIONS Keep all follow-up appointments. It often takes many weeks to correct anemia, and having your health care provider check on your condition and your response to  treatment is very important. SEEK IMMEDIATE MEDICAL CARE IF:   You develop extreme weakness, shortness of breath, or chest pain.   You become dizzy or have trouble concentrating.  You develop heavy vaginal bleeding.   You develop a rash.   You have bloody or black, tarry stools.   You faint.   You vomit up blood.   You vomit repeatedly.   You have abdominal pain.  You have a fever or persistent symptoms for more than 2-3 days.   You have a fever and your symptoms suddenly get worse.   You are dehydrated.  MAKE SURE YOU:  Understand these instructions.  Will watch your condition.  Will get help right away if you are not doing well or get worse.   This information is not intended to replace advice given to you by your health care provider. Make sure you discuss any questions you have with your health care provider.   Document Released: 12/13/2004 Document Revised: 07/08/2013 Document Reviewed: 05/01/2013 Elsevier Interactive Patient Education 2016 [employer]  

## 2016-01-23 NOTE — Discharge Summary (Signed)
Physician Discharge Summary  Henry Ford DPO:[id number] DOB: 02-10-1935 DOA: 01/20/2016  PCP: [name], DO  Admit date: 01/20/2016 Discharge date: 01/23/2016  Recommendations for Outpatient Follow-up:  1. Pt will need to follow up with PCP in 1-2 weeks post discharge 2. Please obtain BMP to evaluate electrolytes and kidney function 3. Please also check CBC to evaluate Hg and Hct levels  Discharge Diagnoses:  Active Problems:   GIB (gastrointestinal bleeding)   Thrombocytosis (HCC)   Acute kidney injury ([place])   Leukocytosis   Absolute anemia   Gastrointestinal hemorrhage with melena   Duodenal ulcer hemorrhagic   Esophageal ulcer   Esophageal stricture  Discharge Condition: Stable  Diet recommendation: Heart healthy diet discussed in details    Brief narrative:    Pt is 81 yo male who is smoker, now s/o angioplasty and stenting of right common iliac artery done 12/2015, on aspirin and plavix, has seen GI doctor earlier in the day and was referred for an admission for further evaluation of heme positive stools, several months anorexia. Pt reports chronic fatigue and poor oral intake, no chest pain or shortness of breath, no fevers, chills, no abd or urinary concerns.   In ED, pt noted to be hemodynamically stable, VSS, blood work notable for Hg 8.3, Cr 1.97. GI consulted and [facility] asked to admit for further evaluation.   Assessment/Plan:    Active Problems:  GIB (gastrointestinal bleeding) - GI team following, assistance is appreciated  - EGD 01/21/16: Esophageal ulcer with adjacent stricture, not actively bleeding. Clean -based duodenal ulcers (these are likely source of FOBT + anemia) - OK to resume Plavix and ASA - continue Protonix BID - pt wants to go home today    Thrombocytosis ([place]) - reactive most likely, resolved    Acute kidney injury ([place]) - likely pre renal - IVF provided in ED, Cr is trending down    Leukocytosis - no clear infectious  etiology - resolved    Smoker - allowed nicotine patch   SCD's for DVT prophylaxis   Code Status: Full.  Family Communication: plan of care discussed with the patient Disposition Plan: Home  Other Consultants:  None  IAnti-Infectives:   None  [name], MD [facility] Pager [phone]      Discharge Exam: Filed Vitals:   01/22/16 2213 01/23/16 0526  BP: 121/61 128/71  Pulse: 68 69  Temp:  98.7 F (37.1 C)  Resp:  20   Filed Vitals:   01/22/16 1400 01/22/16 2038 01/22/16 2213 01/23/16 0526  BP: 120/82 115/46 121/61 128/71  Pulse: 72 80 68 69  Temp: 98.1 F (36.7 C) 99.1 F (37.3 C)  98.7 F (37.1 C)  TempSrc: Oral Oral  Oral  Resp: '16 20  20  '$ Height:      Weight:      SpO2: 99% 98%  96%    General: Pt is alert, follows commands appropriately, not in acute distress Cardiovascular: Regular rate and rhythm, no rubs, no gallops Respiratory: Clear to auscultation bilaterally, no wheezing, no crackles, no rhonchi Abdominal: Soft, non tender, non distended, bowel sounds +, no guarding   Discharge Instructions  Discharge Instructions    Diet - low sodium heart healthy    Complete by:  As directed      Increase activity slowly    Complete by:  As directed             Medication List    STOP taking these medications  naproxen sodium 220 MG tablet  Commonly known as:  ANAPROX      TAKE these medications        aspirin EC 81 MG tablet  Take 81 mg by mouth daily.     clopidogrel 75 MG tablet  Commonly known as:  PLAVIX  Take 1 tablet (75 mg total) by mouth daily.     finasteride 5 MG tablet  Commonly known as:  PROSCAR  Take 5 mg by mouth daily.     megestrol 400 MG/10ML suspension  Commonly known as:  MEGACE  Take 10 mLs (400 mg total) by mouth daily.     nicotine 21 mg/24hr patch  Commonly known as:  NICODERM CQ - dosed in mg/24 hours  Place 1 patch (21 mg total) onto the skin daily.     pantoprazole 40 MG  tablet  Commonly known as:  PROTONIX  Take 1 tablet (40 mg total) by mouth 2 (two) times daily.     terazosin 10 MG capsule  Commonly known as:  HYTRIN  Take 10 mg by mouth at bedtime. Reported on 01/20/2016           Follow-up Information    Follow up with [name], DO.   Specialty:  Osteopathic Medicine   Contact information:   [address] [place] [address] [phone]       Call [name], MD.   Specialty:  Internal Medicine   Why:  As needed   Contact information:   [address] [place] [address] [phone]        The results of significant diagnostics from this hospitalization (including imaging, microbiology, ancillary and laboratory) are listed below for reference.     Microbiology: No results found for this or any previous visit (from the past 240 hour(s)).   Labs: Basic Metabolic Panel:  Recent Labs Lab 01/20/16 1129 01/21/16 0622 01/22/16 0618 01/23/16 0558  NA 140 143 141 140  K 4.7 4.2 4.4 3.9  CL 108 114* 116* 114*  CO2 22 22 18* 21*  GLUCOSE 110* 81 82 88  BUN 28* 22* 16 14  CREATININE 1.97* 1.48* 1.32* 1.42*  CALCIUM 8.3* 7.9* 7.3* 7.5*   Liver Function Tests:  Recent Labs Lab 01/20/16 1129  AST 21  ALT 13*  ALKPHOS 60  BILITOT 0.4  PROT 6.1*  ALBUMIN 3.1*   No results for input(s): LIPASE, AMYLASE in the last 168 hours. No results for input(s): AMMONIA in the last 168 hours. CBC:  Recent Labs Lab 01/20/16 0929 01/20/16 1129 01/21/16 0622 01/21/16 1905 01/22/16 0618 01/23/16 0558  WBC 12.9* 12.3* 9.1  --  7.6 8.8  NEUTROABS 7.8*  --   --   --   --   --   HGB 8.2 Repeated and verified X2.* 8.3* 6.7* 9.5* 9.7* 9.6*  HCT 24.5 Repeated and verified X2.* 25.1* 20.1* 28.4* 29.4* 29.1*  MCV 94.6 97.3 97.1  --  95.8 95.7  PLT 481.0* 465* 395  --  355 368   SIGNED: Time coordinating discharge:  30 minutes  MAGICK-Kenise Barraco, MD  [facility] 01/23/2016, 9:03 AM Pager [phone]  If 7PM-7AM, please contact night-coverage [email or web address] Password [email or web address]

## 2016-01-23 NOTE — Telephone Encounter (Signed)
Henry Ford,   Please call this patient today. I reviewed Dr Blanch Media EGD note from 2 days ago.  The patient has multiple duodenal ulcers.  I see that Dr Henrene Pastor recommended continuing both aspirin and plavix, however I disagree. Henry Ford must stop aspirin immediately, but he can continue plavix for now.  He must see primary care this week to have his CBC checked.  Please also make sure that he is taking iron sulfate tablets twice a day.  I would like to see him in 3 weeks.  We will call when the results of his stomach biopsies are available.

## 2016-01-23 NOTE — Telephone Encounter (Signed)
Patient's wife called 01/20/2016 to let Dr.Alexander know patient would have surgery 01/21/16 for esophageal problem. pak

## 2016-01-23 NOTE — Telephone Encounter (Signed)
Noted. Thanks.

## 2016-01-23 NOTE — Progress Notes (Signed)
Henry Ford to be D/C'd Home per MD order.  Discussed prescriptions and follow up appointments with the patient. Prescriptions given to patient, medication list explained in detail. Pt verbalized understanding.    Medication List    STOP taking these medications        naproxen sodium 220 MG tablet  Commonly known as:  ANAPROX      TAKE these medications        aspirin EC 81 MG tablet  Take 81 mg by mouth daily.     clopidogrel 75 MG tablet  Commonly known as:  PLAVIX  Take 1 tablet (75 mg total) by mouth daily.     finasteride 5 MG tablet  Commonly known as:  PROSCAR  Take 5 mg by mouth daily.     megestrol 400 MG/10ML suspension  Commonly known as:  MEGACE  Take 10 mLs (400 mg total) by mouth daily.     nicotine 21 mg/24hr patch  Commonly known as:  NICODERM CQ - dosed in mg/24 hours  Place 1 patch (21 mg total) onto the skin daily.     pantoprazole 40 MG tablet  Commonly known as:  PROTONIX  Take 1 tablet (40 mg total) by mouth 2 (two) times daily.     terazosin 10 MG capsule  Commonly known as:  HYTRIN  Take 10 mg by mouth at bedtime. Reported on 01/20/2016        Filed Vitals:   01/22/16 2213 01/23/16 0526  BP: 121/61 128/71  Pulse: 68 69  Temp:  98.7 F (37.1 C)  Resp:  20    Skin clean, dry and intact without evidence of skin break down, no evidence of skin tears noted. IV catheter discontinued intact. Site without signs and symptoms of complications. Dressing and pressure applied. Pt denies pain at this time. No complaints noted.  An After Visit Summary was printed and given to the patient. Patient escorted via [place], and D/C home via private auto.  [name] 01/23/2016 10:17 AM

## 2016-01-27 ENCOUNTER — Encounter: Payer: Self-pay | Admitting: Vascular Surgery

## 2016-01-27 ENCOUNTER — Ambulatory Visit (HOSPITAL_COMMUNITY)
Admission: RE | Admit: 2016-01-27 | Discharge: 2016-01-27 | Disposition: A | Discharge status: Home / Self Care | Payer: Commercial Managed Care - HMO | Source: Ambulatory Visit | Attending: Vascular Surgery | Admitting: Vascular Surgery

## 2016-01-27 DIAGNOSIS — I10 Essential (primary) hypertension: Secondary | ICD-10-CM | POA: Insufficient documentation

## 2016-01-27 DIAGNOSIS — Z9862 Peripheral vascular angioplasty status: Secondary | ICD-10-CM | POA: Diagnosis not present

## 2016-01-27 DIAGNOSIS — I739 Peripheral vascular disease, unspecified: Secondary | ICD-10-CM | POA: Insufficient documentation

## 2016-01-27 DIAGNOSIS — R0989 Other specified symptoms and signs involving the circulatory and respiratory systems: Secondary | ICD-10-CM | POA: Diagnosis present

## 2016-01-27 DIAGNOSIS — R938 Abnormal findings on diagnostic imaging of other specified body structures: Secondary | ICD-10-CM | POA: Diagnosis not present

## 2016-01-30 ENCOUNTER — Ambulatory Visit (INDEPENDENT_AMBULATORY_CARE_PROVIDER_SITE_OTHER): Payer: Commercial Managed Care - HMO | Admitting: Osteopathic Medicine

## 2016-01-30 ENCOUNTER — Encounter: Payer: Self-pay | Admitting: Osteopathic Medicine

## 2016-01-30 VITALS — BP 119/51 | HR 68 | Ht 68.0 in | Wt 161.0 lb

## 2016-01-30 DIAGNOSIS — R634 Abnormal weight loss: Secondary | ICD-10-CM

## 2016-01-30 DIAGNOSIS — R944 Abnormal results of kidney function studies: Secondary | ICD-10-CM | POA: Diagnosis not present

## 2016-01-30 DIAGNOSIS — D75839 Thrombocytosis, unspecified: Secondary | ICD-10-CM

## 2016-01-30 DIAGNOSIS — D6489 Other specified anemias: Secondary | ICD-10-CM | POA: Diagnosis not present

## 2016-01-30 DIAGNOSIS — K2210 Ulcer of esophagus without bleeding: Secondary | ICD-10-CM

## 2016-01-30 DIAGNOSIS — K264 Chronic or unspecified duodenal ulcer with hemorrhage: Secondary | ICD-10-CM | POA: Diagnosis not present

## 2016-01-30 DIAGNOSIS — N179 Acute kidney failure, unspecified: Secondary | ICD-10-CM

## 2016-01-30 DIAGNOSIS — D473 Essential (hemorrhagic) thrombocythemia: Secondary | ICD-10-CM

## 2016-01-30 NOTE — Progress Notes (Signed)
HPI: Henry Ford is a 81 y.o. male who presents to [facility] today for chief complaint of:  Chief Complaint  Patient presents with  . Hospitalization Follow-up   Patient was recently admitted to the hospital after he finally agreed to see GI at my request for further evaluation of anemia, melena, weight loss. Patient underwent EGD and received blood transfusion in the hospital as noted below. Overall he is feeling much better since out of the hospital, appetite is improved, has gained a few pounds since he last saw me. Is compliant with Protonix and iron, no problems with constipation at this point.   ANEMIA - Last Hgb 9.6 on 01/23/16, up from 6.7 on 01/21/16  EGD - 01/21/16 (+) duodenal ulcers ENDOSCOPIC IMPRESSION: 1. Large esophageal ulcer without stigmata or active bleeding. Adjacent peptic stricture 2.Clean-based duodenal ulcers likely responsible for melena and acute blood loss anemia. No stigmata. No active bleeding RECOMMENDATIONS: 1. Continue pantoprazole 40 mg IV every 12 hours 2. Okay to resume Plavix and aspirin for recently placed peripheral stents, if necessary (you can check with vascular surgery if uncertain) 3. Avoid NSAIDS otherwise and stop smoking 4. Clear liquid diet, finish current transfusions, monitor blood counts, stools, and vital signs for evidence of recurrent acute bleeding. GI will follow.   [place] date: 01/20/2016 Discharge date: 01/23/2016 Recommendations for Outpatient Follow-up:  1. Pt will need to follow up with PCP in 1-2 weeks post discharge 2. Please obtain BMP to evaluate electrolytes and kidney function 3. Please also check CBC to evaluate Hg and Hct levels Discharge Diagnoses:  Active Problems:  GIB (gastrointestinal bleeding)  Thrombocytosis (HCC)  Acute kidney injury ([place])  Leukocytosis  Absolute anemia  Gastrointestinal hemorrhage with melena  Duodenal ulcer  hemorrhagic  Esophageal ulcer  Esophageal stricture     Past medical, social and family history reviewed: Past Medical History  Diagnosis Date  . Hypertension   . Prostate enlargement   . Peripheral vascular disease ([place]) 12/2015    non-healing ulcer right Hallux. S/P angioplasty and stenting of right common iliac 01/02/2016.  . Right foot pain 12/30/2015    Admitted to [facility] 12/11/2015, discharge 12/14/2015, diagnoses included acute right great toe infection/cellulitis, peripheral vascular disease. ABI demonstrated arterial occlusive disease worse on left. Is referred outpatient to vascular The CT scan documents for copy of his discharge summary. He should follow up with Dr. Ladona Horns, general surgery at [facility]. No dated 02/06/2  . Anemia 12/2015  . Colon polyps ~ 2001    unknown pathology, colonoscopy done at outside hospital.   . Heme positive stool 12/2015   Past Surgical History  Procedure Laterality Date  . Hydrocele excision  1956  . Peripheral vascular catheterization N/A 01/02/2016    Procedure: Abdominal Aortogram w/Lower Extremity;  Surgeon: Angelia Mould, MD;  Location: [place] [facility];  Service: Cardiovascular;  Laterality: N/A;  . Peripheral vascular catheterization  01/02/2016    Procedure: Peripheral Vascular Intervention;  Surgeon: Angelia Mould, MD;  Location: [place] [facility];  Service: Cardiovascular;;  . Esophagogastroduodenoscopy N/A 01/21/2016    Procedure: ESOPHAGOGASTRODUODENOSCOPY (EGD);  Surgeon: Irene Shipper, MD;  Location: [facility];  Service: Endoscopy;  Laterality: N/A;   Social History  Substance Use Topics  . Smoking status: Light Tobacco Smoker -- 0.25 packs/day    Types: Cigarettes  . Smokeless tobacco: Never Used  . Alcohol Use: 0.6 oz/week    1 Cans of beer,  0 Standard drinks or equivalent per week   Family History  Problem Relation Age of Onset  . Colon cancer Neg Hx     Current Outpatient  Prescriptions  Medication Sig Dispense Refill  . aspirin EC 81 MG tablet Take 81 mg by mouth daily.    . clopidogrel (PLAVIX) 75 MG tablet Take 1 tablet (75 mg total) by mouth daily. 30 tablet 4  . ferrous sulfate 325 (65 FE) MG tablet Take 65 mg by mouth 2 (two) times daily with a meal.    . finasteride (PROSCAR) 5 MG tablet Take 5 mg by mouth daily.     . nicotine (NICODERM CQ - DOSED IN MG/24 HOURS) 21 mg/24hr patch Place 1 patch (21 mg total) onto the skin daily. 28 patch 0  . pantoprazole (PROTONIX) 40 MG tablet Take 1 tablet (40 mg total) by mouth 2 (two) times daily. 60 tablet 1  . terazosin (HYTRIN) 10 MG capsule Take 10 mg by mouth at bedtime. Reported on 01/20/2016     No current facility-administered medications for this visit.   No Known Allergies    Review of Systems: CONSTITUTIONAL:  No  fever, no chills, CARDIAC: No  chest pain, No  pressure, No palpitations RESPIRATORY: No  cough, No  shortness of breath/wheeze GASTROINTESTINAL: No  nausea, No  vomiting, No  abdominal pain, No  blood in stool, No  diarrhea, No  constipation  MUSCULOSKELETAL: No  Myalgia/arthralgia, (+) edema, toe is healing NEUROLOGIC: No  weakness, No  dizziness, No  slurred speech   Exam:  BP 119/51 mmHg  Pulse 68  Ht '5\' 8"'$  (1.727 m)  Wt 161 lb (73.029 kg)  BMI 24.49 kg/m2 Constitutional: VS see above. General Appearance: alert, well-developed, well-nourished, NAD Eyes: Normal lids and conjunctive, non-icteric sclera,  Ears, Nose, Mouth, Throat: MMM, Normal external inspection ears/nares/mouth/lips/gums Respiratory: Normal respiratory effort. no wheeze, no rhonchi, no rales Cardiovascular: S1/S2 normal, no murmur, no rub/gallop auscultated. RRR. No lower extremity edema.  Musculoskeletal: Gait normal. (+) 1-2 edema LE bilaterally, improved from previous examd  Skin: warm, dry, intact. No more pallor   Psychiatric: Normal judgment/insight. Normal mood and affect. Oriented x3.      ASSESSMENT/PLAN: Hospital records reviewed as above, patient overall doing better. Continue Protonix and iron, follow-up as directed with GI. Patient advised that if iron makes him constipated, can initiate stool softener/Metamucil, he says he is quite regular at this point. Energy is better, weight is a bit improved. We'll recheck kidney function and CBC today, as well as doing well can follow-up with me in 3-4 months, sooner if worse or any other concerns.  Anemia due to other cause - Plan: CBC with Differential/Platelet  Decreased GFR - Plan: BASIC METABOLIC PANEL WITH GFR  Duodenal ulcer hemorrhagic  Esophageal ulcer  Acute kidney injury ([place])  Thrombocytosis (HCC)  Loss of weight   Return in about 4 months (around 05/23/2016), or sooner if needed, for FOLLOW-UP BP, WEIGHT, ANEMIA.

## 2016-01-31 LAB — BASIC METABOLIC PANEL WITH GFR
BUN: 24 mg/dL (ref 7–25)
CALCIUM: 8.1 mg/dL — AB (ref 8.6–10.3)
CHLORIDE: 112 mmol/L — AB (ref 98–110)
CO2: 22 mmol/L (ref 20–31)
CREATININE: 1.43 mg/dL — AB (ref 0.70–1.18)
GFR, EST NON AFRICAN AMERICAN: 47 mL/min — AB (ref >=60)
GFR, Est African American: 54 mL/min — ABNORMAL LOW (ref >=60)
GLUCOSE: 73 mg/dL (ref 65–99)
Potassium: 4.3 mmol/L (ref 3.5–5.3)
Sodium: 143 mmol/L (ref 135–146)

## 2016-01-31 LAB — CBC WITH DIFFERENTIAL/PLATELET
BASOS PCT: 0 % (ref 0–1)
Basophils Absolute: 0 10*3/uL (ref 0.0–0.1)
Eosinophils Absolute: 0.2 10*3/uL (ref 0.0–0.7)
Eosinophils Relative: 2 % (ref 0–5)
HEMATOCRIT: 29.7 % — AB (ref 39.0–52.0)
HEMOGLOBIN: 9.8 g/dL — AB (ref 13.0–17.0)
LYMPHS PCT: 33 % (ref 12–46)
Lymphs Abs: 3.5 10*3/uL (ref 0.7–4.0)
MCH: 31 pg (ref 26.0–34.0)
MCHC: 33 g/dL (ref 30.0–36.0)
MCV: 94 fL (ref 78.0–100.0)
MONO ABS: 1.3 10*3/uL — AB (ref 0.1–1.0)
MONOS PCT: 12 % (ref 3–12)
MPV: 8.9 fL (ref 8.6–12.4)
NEUTROS ABS: 5.6 10*3/uL (ref 1.7–7.7)
NEUTROS PCT: 53 % (ref 43–77)
Platelets: 290 10*3/uL (ref 150–400)
RBC: 3.16 MIL/uL — AB (ref 4.22–5.81)
RDW: 14.3 % (ref 11.5–15.5)
WBC: 10.5 10*3/uL (ref 4.0–10.5)

## 2016-02-01 ENCOUNTER — Encounter: Payer: Self-pay | Admitting: Vascular Surgery

## 2016-02-01 ENCOUNTER — Ambulatory Visit (INDEPENDENT_AMBULATORY_CARE_PROVIDER_SITE_OTHER): Payer: Commercial Managed Care - HMO | Admitting: Vascular Surgery

## 2016-02-01 VITALS — BP 117/67 | HR 76 | Temp 96.8°F | Resp 18 | Ht 68.0 in | Wt 159.0 lb

## 2016-02-01 DIAGNOSIS — Z48812 Encounter for surgical aftercare following surgery on the circulatory system: Secondary | ICD-10-CM

## 2016-02-01 NOTE — Progress Notes (Signed)
Patient name: Henry Ford MRN: [id number] DOB: [birth year] Sex: male  REASON FOR VISIT: Follow up after angioplasty and stenting of the right common iliac artery.  HPI: Henry Ford is a 81 y.o. male who was evaluated by Dr. Ruta Hinds on 12/28/2015 with a nonhealing wound of the right foot and evidence of multilevel arterial occlusive disease. I performed his arteriogram on 01/02/2016. Creatinine was 4 and therefore the study was done completely with CO2. He had angioplasty and stenting of a 90% right common iliac artery stenosis with a 7 mm x 27 mm balloon expandable stent.  There was no residual stenosis after.  He comes in for an outpatient visit. He denies pain or paresthesias in the right foot. He does have chronic swelling in the right foot. He denies fever. He denies drainage or erythema of the right great toe wound.  Current Outpatient Prescriptions  Medication Sig Dispense Refill  . aspirin EC 81 MG tablet Take 81 mg by mouth daily.    . clopidogrel (PLAVIX) 75 MG tablet Take 1 tablet (75 mg total) by mouth daily. 30 tablet 4  . ferrous sulfate 325 (65 FE) MG tablet Take 65 mg by mouth 2 (two) times daily with a meal.    . finasteride (PROSCAR) 5 MG tablet Take 5 mg by mouth daily.     . nicotine (NICODERM CQ - DOSED IN MG/24 HOURS) 21 mg/24hr patch Place 1 patch (21 mg total) onto the skin daily. 28 patch 0  . pantoprazole (PROTONIX) 40 MG tablet Take 1 tablet (40 mg total) by mouth 2 (two) times daily. 60 tablet 1  . terazosin (HYTRIN) 10 MG capsule Take 10 mg by mouth at bedtime. Reported on 01/20/2016     No current facility-administered medications for this visit.    REVIEW OF SYSTEMS:  '[X]'$  denotes positive finding, '[ ]'$  denotes negative finding Cardiac  Comments:  Chest pain or chest pressure:    Shortness of breath upon exertion:    Short of breath when lying flat:    Irregular heart rhythm:    Constitutional    Fever or chills:      PHYSICAL EXAM: There were  no vitals filed for this visit.  GENERAL: The patient is a well-nourished male, in no acute distress. The vital signs are documented above. CARDIOVASCULAR: There is a regular rate and rhythm. PULMONARY: There is good air exchange bilaterally without wheezing or rales. He has a palpable right femoral pulse. I cannot palpate pedal pulses. He has moderate right lower extremity swelling which is chronic.  LOWER EXTREMITY ARTERIAL DOPPLER: I have reviewed his lower extremity arterial Doppler from 01/27/2016. This shows an ABI of 92%. This is up from 62% prior to his stent. He has a triphasic posterior tibial signal on the right and a biphasic anterior tibial signal.  His arteriogram showed a tight right proximal common iliac artery stenosis which was successfully ballooned and stented. Below that there was some disease in the distal external iliac artery above the groin. Below that things were patent.  MEDICAL ISSUES:  DRY GANGRENE RIGHT GREAT TOE WITH MULTILEVEL ARTERIAL OCCLUSIVE DISEASE: The patient is done well status post angioplasty and stenting of a right common iliac artery stenosis. The toe appears to be healing well. He will continue to soak this daily with lukewarm dye also soaks. We have discussed the importance of tobacco cessation and he has been off of tobacco for 2 weeks. Given the chronic swelling of the right leg and  also encouraged him to elevate his leg as this will also help healing. I'll see him back in 3 months. He knows to call sooner if he has problems.  Deitra Mayo [facility]: [phone]

## 2016-02-08 ENCOUNTER — Ambulatory Visit: Payer: Commercial Managed Care - HMO | Admitting: Osteopathic Medicine

## 2016-02-10 ENCOUNTER — Ambulatory Visit (INDEPENDENT_AMBULATORY_CARE_PROVIDER_SITE_OTHER): Payer: Commercial Managed Care - HMO | Admitting: Osteopathic Medicine

## 2016-02-10 ENCOUNTER — Encounter: Payer: Self-pay | Admitting: Osteopathic Medicine

## 2016-02-10 VITALS — BP 105/64 | HR 59 | Ht 68.0 in | Wt 156.0 lb

## 2016-02-10 DIAGNOSIS — K921 Melena: Secondary | ICD-10-CM | POA: Diagnosis not present

## 2016-02-10 DIAGNOSIS — K5900 Constipation, unspecified: Secondary | ICD-10-CM

## 2016-02-10 DIAGNOSIS — D649 Anemia, unspecified: Secondary | ICD-10-CM

## 2016-02-10 DIAGNOSIS — I959 Hypotension, unspecified: Secondary | ICD-10-CM

## 2016-02-10 LAB — TSH: TSH: 2.13 mIU/L (ref 0.40–4.50)

## 2016-02-10 LAB — COMPLETE METABOLIC PANEL WITH GFR
ALT: 8 U/L — AB (ref 9–46)
AST: 14 U/L (ref 10–35)
Albumin: 3.1 g/dL — ABNORMAL LOW (ref 3.6–5.1)
Alkaline Phosphatase: 70 U/L (ref 40–115)
BUN: 29 mg/dL — ABNORMAL HIGH (ref 7–25)
CHLORIDE: 112 mmol/L — AB (ref 98–110)
CO2: 21 mmol/L (ref 20–31)
Calcium: 8.4 mg/dL — ABNORMAL LOW (ref 8.6–10.3)
Creat: 1.49 mg/dL — ABNORMAL HIGH (ref 0.70–1.18)
GFR, EST AFRICAN AMERICAN: 51 mL/min — AB (ref >=60)
GFR, EST NON AFRICAN AMERICAN: 44 mL/min — AB (ref >=60)
GLUCOSE: 129 mg/dL — AB (ref 65–99)
Potassium: 4.5 mmol/L (ref 3.5–5.3)
SODIUM: 142 mmol/L (ref 135–146)
TOTAL PROTEIN: 6.1 g/dL (ref 6.1–8.1)
Total Bilirubin: 0.5 mg/dL (ref 0.2–1.2)

## 2016-02-10 LAB — IRON AND TIBC
%SAT: 24 % (ref 15–60)
Iron: 46 ug/dL — ABNORMAL LOW (ref 50–180)
TIBC: 191 ug/dL — AB (ref 250–425)
UIBC: 145 ug/dL (ref 125–400)

## 2016-02-10 LAB — FERRITIN: FERRITIN: 547 ng/mL — AB (ref 20–380)

## 2016-02-10 LAB — CBC WITH DIFFERENTIAL/PLATELET
BASOS ABS: 0.1 10*3/uL (ref 0.0–0.1)
Basophils Relative: 1 % (ref 0–1)
Eosinophils Absolute: 0.1 10*3/uL (ref 0.0–0.7)
Eosinophils Relative: 1 % (ref 0–5)
HCT: 30.6 % — ABNORMAL LOW (ref 39.0–52.0)
HEMOGLOBIN: 10 g/dL — AB (ref 13.0–17.0)
LYMPHS ABS: 3.3 10*3/uL (ref 0.7–4.0)
LYMPHS PCT: 27 % (ref 12–46)
MCH: 30.7 pg (ref 26.0–34.0)
MCHC: 32.7 g/dL (ref 30.0–36.0)
MCV: 93.9 fL (ref 78.0–100.0)
MONOS PCT: 12 % (ref 3–12)
MPV: 9.1 fL (ref 8.6–12.4)
Monocytes Absolute: 1.5 10*3/uL — ABNORMAL HIGH (ref 0.1–1.0)
NEUTROS PCT: 59 % (ref 43–77)
Neutro Abs: 7.1 10*3/uL (ref 1.7–7.7)
Platelets: 321 10*3/uL (ref 150–400)
RBC: 3.26 MIL/uL — ABNORMAL LOW (ref 4.22–5.81)
RDW: 14.9 % (ref 11.5–15.5)
WBC: 12.1 10*3/uL — ABNORMAL HIGH (ref 4.0–10.5)

## 2016-02-10 MED ORDER — DOCUSATE SODIUM 100 MG PO CAPS: 100.0000 mg | ORAL_CAPSULE | Freq: Two times a day (BID) | ORAL | Status: DC

## 2016-02-10 NOTE — Progress Notes (Signed)
HPI: Henry Ford is a 81 y.o. male who presents to [facility] today for chief complaint of:  Chief Complaint  Patient presents with  . Weight Loss    Patient was called the office yesterday, concern for low blood pressure measurement at home. Patient seemed beginning weight fact has maybe lost a few pounds. Patient was advised to come in for an appointment for further discussion. I discussed with him, he is feeling much better than he was prior to his hospitalization for GI bleed and subsequent EGD and blood transfusion. He states his energy levels are better, appetite is much improved and he is "eating everything in sight" doesn't seem to be gaining any weight. He was noticing some low blood pressures on home monitor systolic down into the 49E however when this was happening he did not feel dizzy or weak. Blood pressure monitor is about 79 years old, it was recently confirmed against our equipment in the office, but is not with him today. His consistent with iron supplementation, however complaining of some constipation. Has not had anymore episodes of black or tarry stool.   Past medical, social and family history reviewed: Past Medical History  Diagnosis Date  . Hypertension   . Prostate enlargement   . Peripheral vascular disease ([place]) 12/2015    non-healing ulcer right Hallux. S/P angioplasty and stenting of right common iliac 01/02/2016.  . Right foot pain 12/30/2015    Admitted to [facility] 12/11/2015, discharge 12/14/2015, diagnoses included acute right great toe infection/cellulitis, peripheral vascular disease. ABI demonstrated arterial occlusive disease worse on left. Is referred outpatient to vascular The CT scan documents for copy of his discharge summary. He should follow up with Dr. Ladona Horns, general surgery at [facility]. No dated 02/06/2  . Anemia 12/2015  . Colon polyps ~ 2001    unknown pathology, colonoscopy done at outside  hospital.   . Heme positive stool 12/2015  . Gastrointestinal hemorrhage with melena     Status post EGD and blood transfusion March 2017   . Ischemic foot ulcer due to atherosclerosis of native artery of limb ([place]) 01/10/2016   Past Surgical History  Procedure Laterality Date  . Hydrocele excision  1956  . Peripheral vascular catheterization N/A 01/02/2016    Procedure: Abdominal Aortogram w/Lower Extremity;  Surgeon: Angelia Mould, MD;  Location: [facility];  Service: Cardiovascular;  Laterality: N/A;  . Peripheral vascular catheterization  01/02/2016    Procedure: Peripheral Vascular Intervention;  Surgeon: Angelia Mould, MD;  Location: [facility];  Service: Cardiovascular;;  . Esophagogastroduodenoscopy N/A 01/21/2016    Procedure: ESOPHAGOGASTRODUODENOSCOPY (EGD);  Surgeon: Irene Shipper, MD;  Location: [facility];  Service: Endoscopy;  Laterality: N/A;   Social History  Substance Use Topics  . Smoking status: Former Smoker -- 0.25 packs/day    Types: Cigarettes    Quit date: 01/18/2016  . Smokeless tobacco: Never Used  . Alcohol Use: 0.6 oz/week    1 Cans of beer, 0 Standard drinks or equivalent per week   Family History  Problem Relation Age of Onset  . Colon cancer Neg Hx     Current Outpatient Prescriptions  Medication Sig Dispense Refill  . aspirin EC 81 MG tablet Take 81 mg by mouth daily. Reported on 02/01/2016    . clopidogrel (PLAVIX) 75 MG tablet Take 1 tablet (75 mg total) by mouth daily. 30 tablet 4  . ferrous sulfate 325 (65 FE) MG tablet Take  65 mg by mouth 2 (two) times daily with a meal.    . finasteride (PROSCAR) 5 MG tablet Take 5 mg by mouth daily. Reported on 02/01/2016    . nicotine (NICODERM CQ - DOSED IN MG/24 HOURS) 21 mg/24hr patch Place 1 patch (21 mg total) onto the skin daily. 28 patch 0  . pantoprazole (PROTONIX) 40 MG tablet Take 1 tablet (40 mg total) by mouth 2 (two) times daily. 60 tablet 1  . terazosin (HYTRIN) 10 MG  capsule Take 10 mg by mouth at bedtime. Reported on 01/20/2016     No current facility-administered medications for this visit.   No Known Allergies    Review of Systems: CONSTITUTIONAL:  No  fever, no chills, (+) unintentional weight changes HEAD/EYES/EARS/NOSE/THROAT: No  headache, no vision change, CARDIAC: No  chest pain, No  pressure, No palpitations, No  orthopnea RESPIRATORY: No  cough, No  shortness of breath/wheeze GASTROINTESTINAL: No  nausea, No  vomiting, No  abdominal pain, No  blood in stool, (+) constipation SKIN: No  rash/wounds/concerning lesions HEM/ONC: No  easy bruising/bleeding, No  abnormal lymph node NEUROLOGIC: No  weakness, No  dizziness, No  slurred speech   Exam:  BP 105/64 mmHg  Pulse 59  Ht '5\' 8"'  (1.727 m)  Wt 156 lb (70.761 kg)  BMI 23.73 kg/m2BP same on manual recheck.  Constitutional: VS see above. General Appearance: alert, well-developed, thin , NAD Eyes: Normal lids and conjunctive, non-icteric sclera,  Ears, Nose, Mouth, Throat: MMM, Normal external inspection ears/nares/mouth/lips/gums, Respiratory: Normal respiratory effort. no wheeze, no rhonchi, no rales Cardiovascular: S1/S2 normal, no murmur, no rub/gallop auscultated. RRR.Trace lower extremity edema, improved from previous exams. Neurological: No cranial nerve deficit on limited exam. Motor and sensation intact and symmetric Skin: warm, dry, intact.  Psychiatric: Normal judgment/insight. Normal mood and affect. Oriented x3.    Results for orders placed or performed in visit on 02/10/16 (from the past 72 hour(s))  COMPLETE METABOLIC PANEL WITH GFR     Status: Abnormal   Collection Time: 02/10/16 11:04 AM  Result Value Ref Range   Sodium 142 135 - 146 mmol/L   Potassium 4.5 3.5 - 5.3 mmol/L   Chloride 112 (H) 98 - 110 mmol/L   CO2 21 20 - 31 mmol/L   Glucose, Bld 129 (H) 65 - 99 mg/dL   BUN 29 (H) 7 - 25 mg/dL   Creat 1.49 (H) 0.70 - 1.18 mg/dL   Total Bilirubin 0.5 0.2 - 1.2 mg/dL    Alkaline Phosphatase 70 40 - 115 U/L   AST 14 10 - 35 U/L   ALT 8 (L) 9 - 46 U/L   Total Protein 6.1 6.1 - 8.1 g/dL   Albumin 3.1 (L) 3.6 - 5.1 g/dL   Calcium 8.4 (L) 8.6 - 10.3 mg/dL   GFR, Est African American 51 (L) >=60 mL/min   GFR, Est Non African American 44 (L) >=60 mL/min    Comment:   The estimated GFR is a calculation valid for adults (>=63 years old) that uses the CKD-EPI algorithm to adjust for age and sex. It is   not to be used for children, pregnant women, hospitalized patients,    patients on dialysis, or with rapidly changing kidney function. According to the NKDEP, eGFR >89 is normal, 60-89 shows mild impairment, 30-59 shows moderate impairment, 15-29 shows severe impairment and <15 is ESRD.     CBC with Differential/Platelet     Status: Abnormal   Collection Time: 02/10/16  11:04 AM  Result Value Ref Range   WBC 12.1 (H) 4.0 - 10.5 K/uL   RBC 3.26 (L) 4.22 - 5.81 MIL/uL   Hemoglobin 10.0 (L) 13.0 - 17.0 g/dL   HCT 30.6 (L) 39.0 - 52.0 %   MCV 93.9 78.0 - 100.0 fL   MCH 30.7 26.0 - 34.0 pg   MCHC 32.7 30.0 - 36.0 g/dL   RDW 14.9 11.5 - 15.5 %   Platelets 321 150 - 400 K/uL   MPV 9.1 8.6 - 12.4 fL   Neutrophils Relative % 59 43 - 77 %   Neutro Abs 7.1 1.7 - 7.7 K/uL   Lymphocytes Relative 27 12 - 46 %   Lymphs Abs 3.3 0.7 - 4.0 K/uL   Monocytes Relative 12 3 - 12 %   Monocytes Absolute 1.5 (H) 0.1 - 1.0 K/uL   Eosinophils Relative 1 0 - 5 %   Eosinophils Absolute 0.1 0.0 - 0.7 K/uL   Basophils Relative 1 0 - 1 %   Basophils Absolute 0.1 0.0 - 0.1 K/uL   Smear Review Criteria for review not met   TSH     Status: None   Collection Time: 02/10/16 11:04 AM  Result Value Ref Range   TSH 2.13 0.40 - 4.50 mIU/L  Iron and TIBC     Status: Abnormal   Collection Time: 02/10/16 11:04 AM  Result Value Ref Range   Iron 46 (L) 50 - 180 ug/dL   UIBC 145 125 - 400 ug/dL   TIBC 191 (L) 250 - 425 ug/dL   %SAT 24 15 - 60 %  Ferritin     Status: Abnormal    Collection Time: 02/10/16 11:04 AM  Result Value Ref Range   Ferritin 547 (H) 20 - 380 ng/mL      ASSESSMENT/PLAN: Kidney function stable, hematocrit stable, albumin is low, iron deficiency/chronic disease anemia. Reassured patient that given his lack of symptoms such as severe weakness, dizziness, fatigue, and affect if she is able to perform all activities of daily living as he wishes (not getting winded when going to the mailbox, able to obtain clean) is reassuring. Of course lack of weight gain and low blood pressure readings are concerning, however symptoms are more important to me at this point. Blood pressure in the office is on the low side, he is consistently at about this blood pressure. Advised to restart boost or ensure supplementation for increased calories, advised that a lot of his metabolic energy goes into healing and repletion of blood disorders, anemia has improved. We'll plan to follow-up in 2 weeks for weight recheck and bring blood pressure cuff to all appointments, advised would probably be reasonable to only check blood pressure at home if he is feeling any concerning symptoms. Constipation due to iron, black stool softener, advised increased fiber and water.  Constipation, unspecified constipation type - Plan: docusate sodium (COLACE) 100 MG capsule, TSH  Hypotension, unspecified hypotension type - Plan: COMPLETE METABOLIC PANEL WITH GFR, TSH - ER/RTC precautions reviewed  Anemia, unspecified anemia type - Plan: CBC with Differential/Platelet, Iron and TIBC, Ferritin  Gastrointestinal hemorrhage with melena - resolved post hospitalization, ER/RTC precautions reviewed   Return in about 2 weeks (around 02/24/2016) for WEIGHT CHECK .

## 2016-02-11 ENCOUNTER — Encounter: Payer: Self-pay | Admitting: Osteopathic Medicine

## 2016-02-16 ENCOUNTER — Ambulatory Visit (INDEPENDENT_AMBULATORY_CARE_PROVIDER_SITE_OTHER): Payer: Commercial Managed Care - HMO | Admitting: Gastroenterology

## 2016-02-16 ENCOUNTER — Encounter: Payer: Self-pay | Admitting: Gastroenterology

## 2016-02-16 VITALS — BP 100/60 | HR 76 | Ht 68.0 in | Wt 155.0 lb

## 2016-02-16 DIAGNOSIS — K2210 Ulcer of esophagus without bleeding: Secondary | ICD-10-CM | POA: Diagnosis not present

## 2016-02-16 DIAGNOSIS — D500 Iron deficiency anemia secondary to blood loss (chronic): Secondary | ICD-10-CM | POA: Diagnosis not present

## 2016-02-16 DIAGNOSIS — K264 Chronic or unspecified duodenal ulcer with hemorrhage: Secondary | ICD-10-CM

## 2016-02-16 NOTE — Patient Instructions (Addendum)
You have been scheduled for an endoscopy. Please follow written instructions given to you at your visit today. If you use inhalers (even only as needed), please bring them with you on the day of your procedure. Your physician has requested that you go to www.[email or web address].com and enter the access code given to you at your visit today. This web site gives a general overview about your procedure. However, you should still follow specific instructions given to you by our office regarding your preparation for the procedure.  Please stay on your PLAVIX for your endoscopy. We will not hold at this time.  Labs mid to late next week. You can have these done at your primary doctors office.  If you are age 79 or older, your body mass index should be between 23-30. Your Body mass index is 23.57 kg/(m^2). If this is out of the aforementioned range listed, please consider follow up with your Primary Care Provider.  If you are age 15 or younger, your body mass index should be between 19-25. Your Body mass index is 23.57 kg/(m^2). If this is out of the aformentioned range listed, please consider follow up with your Primary Care Provider.    Thank you for choosing [facility]  Dr Wilfrid Lund III

## 2016-02-16 NOTE — Progress Notes (Signed)
[facility] Progress Note  Chief Complaint: Anemia and duodenal ulcer  Subjective History:  This is office follow-up for patient to was last seen several weeks ago. At that point he had melena and severe anemia from GI blood loss. He was sent for urgent hospital stay, he was seen by Dr. Henrene Pastor and underwent upper endoscopy, revealing a clean base esophageal ulcer and multiple duodenal ulcers. A biopsy was taken for CLO, but the results cannot currently be located. He went home on twice daily Protonix and iron tablets, and was told to stay on both aspirin and Plavix. A few days later when I receive the report, I had our office contact him so that he would stop aspirin but continue Plavix. He has had no further melena, but his stool is dark on iron. He denies abdominal pain nausea, vomiting, or anorexia. He has difficulty putting on weight. He also quit smoking after the last office visit. ROS: Cardiovascular:  no chest pain Respiratory: no dyspnea  The patient's Past Medical, Family and Social History were reviewed and are on file in the EMR.  Objective:  Med list reviewed  Vital signs in last 24 hrs: Filed Vitals:   02/16/16 1109  BP: 100/60  Pulse: 76    Physical Exam   HEENT: sclera anicteric, oral mucosa moist without lesions  Neck: supple, no thyromegaly, JVD or lymphadenopathy  Cardiac: RRR without murmurs, S1S2 heard, no peripheral edema  Pulm: clear to auscultation bilaterally, normal RR and effort noted  Abdomen: soft, No tenderness, with active bowel sounds. No guarding or palpable hepatosplenomegaly.  Skin; warm and dry, no jaundice or rash  Recent Labs: Lab Results  Component Value Date   WBC 12.1* 02/10/2016   HGB 10.0* 02/10/2016   HCT 30.6* 02/10/2016   MCV 93.9 02/10/2016   PLT 321 02/10/2016    Cannot locate clo test result  '@ASSESSMENTPLANBEGIN'$ @ Assessment: Encounter Diagnoses  Name Primary?  . Duodenal ulcer hemorrhagic Yes  . Esophageal ulcer    . Anemia due to chronic blood loss     The ulcers have probably not healed yet given the number and severity of them, his poor nutrition and only recent discontinuation of aspirin. We also need to know his H. pylori status.  Plan: CBC and H. pylori antibody mid to late next week. If H. pylori positive, will give him a 14 day course of therapy. Continue iron and Protonix in the meantime Continue Plavix but stay off aspirin (the patient had a peripheral stent in February) EGD in 4 weeks to assess ulcer healing. If he is H. pylori positive on upcoming serology, we will also biopsy  to confirm eradication.  Nelida Meuse III

## 2016-02-20 ENCOUNTER — Telehealth: Payer: Self-pay | Admitting: Gastroenterology

## 2016-02-20 DIAGNOSIS — A048 Other specified bacterial intestinal infections: Secondary | ICD-10-CM

## 2016-02-22 NOTE — Telephone Encounter (Signed)
The pt was notified to come to the [facility] lab and have the blood work drawn, he will be in this week. The order was placed in EPIC

## 2016-02-24 ENCOUNTER — Other Ambulatory Visit (INDEPENDENT_AMBULATORY_CARE_PROVIDER_SITE_OTHER): Payer: Commercial Managed Care - HMO

## 2016-02-24 ENCOUNTER — Other Ambulatory Visit: Payer: Self-pay

## 2016-02-24 DIAGNOSIS — K264 Chronic or unspecified duodenal ulcer with hemorrhage: Secondary | ICD-10-CM

## 2016-02-24 DIAGNOSIS — D500 Iron deficiency anemia secondary to blood loss (chronic): Secondary | ICD-10-CM | POA: Diagnosis not present

## 2016-02-24 DIAGNOSIS — K2210 Ulcer of esophagus without bleeding: Secondary | ICD-10-CM | POA: Diagnosis not present

## 2016-02-24 LAB — CBC WITH DIFFERENTIAL/PLATELET
BASOS PCT: 0.5 % (ref 0.0–3.0)
Basophils Absolute: 0.1 10*3/uL (ref 0.0–0.1)
EOS ABS: 0.2 10*3/uL (ref 0.0–0.7)
Eosinophils Relative: 1.7 % (ref 0.0–5.0)
HCT: 28.6 % — ABNORMAL LOW (ref 39.0–52.0)
Hemoglobin: 9.7 g/dL — ABNORMAL LOW (ref 13.0–17.0)
Lymphocytes Relative: 30.3 % (ref 12.0–46.0)
Lymphs Abs: 3.6 10*3/uL (ref 0.7–4.0)
MCHC: 33.9 g/dL (ref 30.0–36.0)
MCV: 93 fl (ref 78.0–100.0)
MONO ABS: 1.2 10*3/uL — AB (ref 0.1–1.0)
Monocytes Relative: 9.8 % (ref 3.0–12.0)
NEUTROS ABS: 6.9 10*3/uL (ref 1.4–7.7)
Neutrophils Relative %: 57.7 % (ref 43.0–77.0)
PLATELETS: 339 10*3/uL (ref 150.0–400.0)
RBC: 3.08 Mil/uL — ABNORMAL LOW (ref 4.22–5.81)
RDW: 14.4 % (ref 11.5–15.5)
WBC: 12 10*3/uL — AB (ref 4.0–10.5)

## 2016-02-24 LAB — H. PYLORI ANTIBODY, IGG: H PYLORI IGG: NEGATIVE

## 2016-03-16 ENCOUNTER — Ambulatory Visit (AMBULATORY_SURGERY_CENTER): Payer: Commercial Managed Care - HMO | Admitting: Gastroenterology

## 2016-03-16 ENCOUNTER — Encounter: Payer: Self-pay | Admitting: Gastroenterology

## 2016-03-16 VITALS — BP 133/58 | HR 74 | Temp 97.3°F | Resp 17 | Ht 68.0 in | Wt 155.0 lb

## 2016-03-16 DIAGNOSIS — K264 Chronic or unspecified duodenal ulcer with hemorrhage: Secondary | ICD-10-CM

## 2016-03-16 MED ORDER — SODIUM CHLORIDE 0.9 % IV SOLN: 500.0000 mL | INTRAVENOUS | Status: DC

## 2016-03-16 NOTE — Patient Instructions (Signed)
YOU HAD AN ENDOSCOPIC PROCEDURE TODAY AT [facility]:   Refer to the procedure report that was given to you for any specific questions about what was found during the examination.  If the procedure report does not answer your questions, please call your gastroenterologist to clarify.  If you requested that your care partner not be given the details of your procedure findings, then the procedure report has been included in a sealed envelope for you to review at your convenience later.  YOU SHOULD EXPECT: Some feelings of bloating in the abdomen. Passage of more gas than usual.  Walking can help get rid of the air that was put into your GI tract during the procedure and reduce the bloating. If you had a lower endoscopy (such as a colonoscopy or flexible sigmoidoscopy) you may notice spotting of blood in your stool or on the toilet paper. If you underwent a bowel prep for your procedure, you may not have a normal bowel movement for a few days.  Please Note:  You might notice some irritation and congestion in your nose or some drainage.  This is from the oxygen used during your procedure.  There is no need for concern and it should clear up in a day or so.  SYMPTOMS TO REPORT IMMEDIATELY:    Following upper endoscopy (EGD)  Vomiting of blood or coffee ground material  New chest pain or pain under the shoulder blades  Painful or persistently difficult swallowing  New shortness of breath  Fever of 100F or higher  Black, tarry-looking stools  For urgent or emergent issues, a gastroenterologist can be reached at any hour by calling [phone].   DIET: Your first meal following the procedure should be a small meal and then it is ok to progress to your normal diet. Heavy or fried foods are harder to digest and may make you feel nauseous or bloated.  Likewise, meals heavy in dairy and vegetables can increase bloating.  Drink plenty of fluids but you should avoid alcoholic beverages  for 24 hours.  ACTIVITY:  You should plan to take it easy for the rest of today and you should NOT DRIVE or use heavy machinery until tomorrow (because of the sedation medicines used during the test).    FOLLOW UP: Our staff will call the number listed on your records the next business day following your procedure to check on you and address any questions or concerns that you may have regarding the information given to you following your procedure. If we do not reach you, we will leave a message.  However, if you are feeling well and you are not experiencing any problems, there is no need to return our call.  We will assume that you have returned to your regular daily activities without incident.  If any biopsies were taken you will be contacted by phone or by letter within the next 1-3 weeks.  Please call us at [phone] if you have not heard about the biopsies in 3 weeks.    SIGNATURES/CONFIDENTIALITY: You and/or your care partner have signed paperwork which will be entered into your electronic medical record.  These signatures attest to the fact that that the information above on your After Visit Summary has been reviewed and is understood.  Full responsibility of the confidentiality of this discharge information lies with you and/or your care-partner.    Handout was given to your care partner on a hiatal hernia. Please discontinue acid reducing med today.  No aspirin, Ibuprofen, naproxen, or any other NSAID drugs. You may resume your other current medications today. Have your hemoglobin checked by your primary care provider in 3-4 weeks to see when iron supplements can be stopped. Please call if any questions or concerns.

## 2016-03-16 NOTE — Progress Notes (Signed)
No problems noted in the recovery room. maw 

## 2016-03-16 NOTE — Progress Notes (Signed)
Report to PACU, RN, vss, BBS= Clear.  

## 2016-03-16 NOTE — Op Note (Signed)
[facility] Patient Name: Henry Ford Procedure Date: 03/16/2016 9:53 AM MRN: [id number] Endoscopist: [place]. 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 The patient's tolerance of                            previous anesthesia was also reviewed. The risks                            and benefits of the procedure and the sedation                            options and risks were discussed with the patient.                            All questions were answered, and informed consent                            was obtained. Prior Anticoagulants: The patient has                            taken Plavix (clopidogrel), last dose was 1 day                            prior to procedure. ASA Grade Assessment: II - A                            patient with mild systemic disease. After reviewing                            the risks and benefits, the patient was deemed in                            satisfactory condition to undergo the procedure.                           After obtaining informed consent, the endoscope was  passed under direct vision. Throughout the                            procedure, the patient's blood pressure, pulse, and                            oxygen saturations were monitored continuously. The                            Model GIF-HQ190 [id number]) scope was introduced                            through the mouth, and advanced to the second part                            of duodenum. The upper GI  endoscopy was                            accomplished without difficulty. The patient                            tolerated the procedure well. Scope In: Scope Out: Findings:                 The esophagus was normal except for a small hiatal                            hernia.The previously-seen ulcer has healed.                           The stomach was normal.                           The examined duodenum was normal. The previously                            seen ulcer has healed.                           The cardia and gastric fundus were normal on                            retroflexion. Complications:            No immediate complications. Estimated Blood Loss:     Estimated blood loss: none. Impression:               - Normal esophagus except small hiatal hernia.                           - Normal stomach.                           - Normal examined duodenum.                           - No specimens collected. Recommendation:           -  Patient has a contact number available for                            emergencies. The signs and symptoms of potential                            delayed complications were discussed with the                            patient. Return to normal activities tomorrow.                            Written discharge instructions were provided to the                            patient.                           - Resume previous diet.                           - Discontinue PPIs today.                           - No aspirin, ibuprofen, naproxen, or other                            non-steroidal anti-inflammatory drugs.                           - Have hemoglobin checked by primary care provider                            in 3-4 weeks to see when iron supplements can be                            stopped. Arvine Clayburn L. Loletha Carrow, MD 03/16/2016 10:12:48 AM This report has been signed electronically.

## 2016-03-19 ENCOUNTER — Telehealth: Payer: Self-pay

## 2016-03-19 DIAGNOSIS — I639 Cerebral infarction, unspecified: Secondary | ICD-10-CM

## 2016-03-19 HISTORY — DX: Cerebral infarction, unspecified: I63.9

## 2016-03-19 NOTE — Telephone Encounter (Signed)
  Follow up Call-  Call back number 03/16/2016  Post procedure Call Back phone  # [phone]  Permission to leave phone message Yes     Patient questions:  Do you have a fever, pain , or abdominal swelling? No. Pain Score  0 *  Have you tolerated food without any problems? Yes.    Have you been able to return to your normal activities? Yes.    Do you have any questions about your discharge instructions: Diet   No. Medications  No. Follow up visit  No.  Do you have questions or concerns about your Care? No.  Actions: * If pain score is 4 or above: No action needed, pain <4.  No problems per the pt. maw

## 2016-04-05 ENCOUNTER — Encounter: Payer: Self-pay | Admitting: Osteopathic Medicine

## 2016-04-05 ENCOUNTER — Ambulatory Visit (INDEPENDENT_AMBULATORY_CARE_PROVIDER_SITE_OTHER): Payer: Commercial Managed Care - HMO | Admitting: Osteopathic Medicine

## 2016-04-05 VITALS — BP 111/61 | HR 91 | Ht 68.0 in | Wt 155.0 lb

## 2016-04-05 DIAGNOSIS — M7989 Other specified soft tissue disorders: Secondary | ICD-10-CM

## 2016-04-05 DIAGNOSIS — D6489 Other specified anemias: Secondary | ICD-10-CM

## 2016-04-05 DIAGNOSIS — R634 Abnormal weight loss: Secondary | ICD-10-CM

## 2016-04-05 DIAGNOSIS — R630 Anorexia: Secondary | ICD-10-CM | POA: Diagnosis not present

## 2016-04-05 DIAGNOSIS — N183 Chronic kidney disease, stage 3 unspecified: Secondary | ICD-10-CM

## 2016-04-05 DIAGNOSIS — I872 Venous insufficiency (chronic) (peripheral): Secondary | ICD-10-CM

## 2016-04-05 NOTE — Progress Notes (Signed)
HPI: Henry Ford is a 81 y.o. male who presents to [facility] today for chief complaint of:  Chief Complaint  Patient presents with  . Follow-up    Constipation    ANEMIA/IRON - Patient recently treated for GI bleed, reviewed recent records from GI repeat scope, looked ok except small ulcer mostly healed, off PPI per GI recs few weeks ago, then had heartburn and noticed weight loss, then restarted the PPI few days ago. Since back on the medicine, has gained a bit of weight back. GI notes mention he should follow up with me got Hct check   LOWER EXTREMITY SWELLING - history of peripheral vascular disease, status post angioplasty and stenting of lower extremities, patient is on Plavix, chronic lower extremity edema however patient does not want to wear compression stockings.  LOW APPETITE - patient is frustrated that he has not been able to get back up to his normal weight. Initially was doing ensure and boost drinks, doing these maybe intermittently now. Has not lost any additional weight since last visit.   Past medical, social and family history reviewed: Past Medical History  Diagnosis Date  . Hypertension   . Prostate enlargement   . Peripheral vascular disease ([place]) 12/2015    non-healing ulcer right Hallux. S/P angioplasty and stenting of right common iliac 01/02/2016.  . Right foot pain 12/30/2015    Admitted to [facility] 12/11/2015, discharge 12/14/2015, diagnoses included acute right great toe infection/cellulitis, peripheral vascular disease. ABI demonstrated arterial occlusive disease worse on left. Is referred outpatient to vascular The CT scan documents for copy of his discharge summary. He should follow up with Dr. Ladona Horns, general surgery at [facility]. No dated 02/06/2  . Anemia 12/2015  . Colon polyps ~ 2001    unknown pathology, colonoscopy done at outside hospital.   . Heme positive stool 12/2015  . Gastrointestinal  hemorrhage with melena     Status post EGD and blood transfusion March 2017   . 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 Hydrocele excision  1956  . Peripheral vascular catheterization N/A 01/02/2016    Procedure: Abdominal Aortogram w/Lower Extremity;  Surgeon: Angelia Mould, MD;  Location: [facility];  Service: Cardiovascular;  Laterality: N/A;  . Peripheral vascular catheterization  01/02/2016    Procedure: Peripheral Vascular Intervention;  Surgeon: Angelia Mould, MD;  Location: [place] [facility];  Service: Cardiovascular;;  . Esophagogastroduodenoscopy N/A 01/21/2016    Procedure: ESOPHAGOGASTRODUODENOSCOPY (EGD);  Surgeon: Irene Shipper, MD;  Location: [facility];  Service: Endoscopy;  Laterality: N/A;   Social History  Substance Use Topics  . Smoking status: Former Smoker -- 0.25 packs/day    Types: Cigarettes    Quit date: 01/18/2016  . Smokeless tobacco: Never Used  . Alcohol Use: 6.0 oz/week    0 Standard drinks or equivalent, 10 Cans of beer per week     Comment: beer   Family History  Problem Relation Age of Onset  . Colon cancer Neg Hx   . Diabetes Brother     Current Outpatient Prescriptions  Medication Sig Dispense Refill  . clopidogrel (PLAVIX) 75 MG tablet Take 1 tablet (75 mg total) by mouth daily. 30 tablet 4  . docusate sodium (COLACE) 100 MG capsule Take 1 capsule (100 mg total) by mouth 2 (two) times daily. (Patient taking differently: Take 100 mg  by mouth 2 (two) times daily as needed. ) 10 capsule 0  . ferrous sulfate 325 (65 FE) MG tablet Take 65 mg by mouth 2 (two) times daily with a meal.    . finasteride (PROSCAR) 5 MG tablet Take 5 mg by mouth daily. Reported on 02/01/2016    . nicotine (NICODERM CQ - DOSED IN MG/24 HOURS) 14 mg/24hr patch Place 14 mg onto the skin daily. Reported on 03/16/2016    . pantoprazole (PROTONIX) 40 MG tablet  Take 1 tablet (40 mg total) by mouth 2 (two) times daily. 60 tablet 1  . terazosin (HYTRIN) 10 MG capsule Take 10 mg by mouth at bedtime. Reported on 01/20/2016     No current facility-administered medications for this visit.   No Known Allergies    Review of Systems: CONSTITUTIONAL:  No  fever, no chills, No  unintentional weight changes except as noted in HPI HEAD/EYES/EARS/NOSE/THROAT: No  headache, no vision chang CARDIAC: No  chest pain, No  pressure, No palpitations, No  orthopnea RESPIRATORY: No  cough, No  shortness of breath/wheeze GASTROINTESTINAL: No  nausea, No  vomiting, No  abdominal pain, No  blood in stool, No  diarrhea, No  constipation  SKIN: No  rash/wounds/concerning lesions HEM/ONC: (+) easy bruising/bleeding, No  abnormal lymph node ENDOCRINE: No polyuria/polydipsia/polyphagia, No  heat/cold intolerance  NEUROLOGIC: (+) generalized weakness, No  dizziness, No  slurred speech   Exam:  BP 111/61 mmHg  Pulse 91  Ht '5\' 8"'  (1.727 m)  Wt 155 lb (70.308 kg)  BMI 23.57 kg/m2 Constitutional: VS see above. General Appearance: alert, well-developed, well-nourished, NAD Eyes: Normal lids and conjunctive, non-icteric sclera,  Ears, Nose, Mouth, Throat: MMM, Normal external inspection ears/nares/mouth/lips/gums,   Neck: No masses, trachea midline.  Respiratory: Normal respiratory effort. no wheeze, no rhonchi, no rales Cardiovascular: S1/S2 normal, no murmur, no rub/gallop auscultated. RRR. (+)1-2 lower extremity edema. Gastrointestinal: Nontender, no masses.  Musculoskeletal: Gait normal. No clubbing/cyanosis of digits.  Neurological: No cranial nerve deficit on limited exam. Motor and sensation intact and symmetric Skin: warm, dry, intact.  Psychiatric: Normal judgment/insight. Normal mood and affect. Oriented x3.    Results for orders placed or performed in visit on 04/05/16 (from the past 72 hour(s))  Iron and TIBC     Status: Abnormal   Collection Time: 04/05/16   3:42 PM  Result Value Ref Range   Iron 29 (L) 50 - 180 ug/dL   UIBC 181 125 - 400 ug/dL   TIBC 210 (L) 250 - 425 ug/dL   %SAT 14 (L) 15 - 60 %  Ferritin     Status: Abnormal   Collection Time: 04/05/16  3:42 PM  Result Value Ref Range   Ferritin 393 (H) 20 - 380 ng/mL  CBC with Differential     Status: Abnormal   Collection Time: 04/05/16  3:42 PM  Result Value Ref Range   WBC 11.4 (H) 3.8 - 10.8 K/uL   RBC 3.18 (L) 4.20 - 5.80 MIL/uL   Hemoglobin 9.6 (L) 13.2 - 17.1 g/dL   HCT 29.7 (L) 38.5 - 50.0 %   MCV 93.4 80.0 - 100.0 fL   MCH 30.2 27.0 - 33.0 pg   MCHC 32.3 32.0 - 36.0 g/dL   RDW 14.6 11.0 - 15.0 %   Platelets 271 140 - 400 K/uL   MPV 9.1 7.5 - 12.5 fL   Neutro Abs 6954 1500 - 7800 cells/uL   Lymphs Abs 2736 850 - 3900 cells/uL  Monocytes Absolute 1254 (H) 200 - 950 cells/uL   Eosinophils Absolute 342 15 - 500 cells/uL   Basophils Absolute 114 0 - 200 cells/uL   Neutrophils Relative % 61 %   Lymphocytes Relative 24 %   Monocytes Relative 11 %   Eosinophils Relative 3 %   Basophils Relative 1 %   Smear Review Criteria for review not met     Comment: ** Please note change in unit of measure and reference range(s). **  COMPLETE METABOLIC PANEL WITH GFR     Status: Abnormal   Collection Time: 04/05/16  3:42 PM  Result Value Ref Range   Sodium 141 135 - 146 mmol/L   Potassium 4.5 3.5 - 5.3 mmol/L   Chloride 107 98 - 110 mmol/L   CO2 21 20 - 31 mmol/L   Glucose, Bld 99 65 - 99 mg/dL   BUN 27 (H) 7 - 25 mg/dL   Creat 1.54 (H) 0.70 - 1.18 mg/dL   Total Bilirubin 0.3 0.2 - 1.2 mg/dL   Alkaline Phosphatase 87 40 - 115 U/L   AST 12 10 - 35 U/L   ALT 5 (L) 9 - 46 U/L   Total Protein 6.5 6.1 - 8.1 g/dL   Albumin 3.4 (L) 3.6 - 5.1 g/dL   Calcium 8.6 8.6 - 10.3 mg/dL   GFR, Est African American 49 (L) >=60 mL/min   GFR, Est Non African American 43 (L) >=60 mL/min    Comment:   The estimated GFR is a calculation valid for adults (>=26 years old) that uses the CKD-EPI  algorithm to adjust for age and sex. It is   not to be used for children, pregnant women, hospitalized patients,    patients on dialysis, or with rapidly changing kidney function. According to the NKDEP, eGFR >89 is normal, 60-89 shows mild impairment, 30-59 shows moderate impairment, 15-29 shows severe impairment and <15 is ESRD.          ASSESSMENT/PLAN: Labs reviewed, patient is still anemic, would advise continue iron supplementation. Advised 3 times a day nutritional supplementation, absolutely no calorie restriction, patient and his wife are considering getting back with the [employer] to start an exercise regimen and I encourage this as tolerated. Patient advised to wear compression stockings to see if this helps his lower exam the swelling, he says he doesn't want to do this, I advised him that edema would likely persist if he is not willing to try this intervention.   Anemia due to other cause - Plan: Iron and TIBC, Ferritin, CBC with Differential  Chronic kidney disease, stage 3 (moderate) - Plan: COMPLETE METABOLIC PANEL WITH GFR  Loss of weight  Decreased appetite  Swelling of lower extremity  Chronic venous insufficiency   All questions were answered. Visit summary with updated medication list and pertinent instructions was printed for patient. ER/RTC precautions were reviewed with the patient. Return in about 3 months (around 07/06/2016), or sooner if needed, for [employer].

## 2016-04-05 NOTE — Patient Instructions (Signed)
If interested in knee pain treatments - make appointment with Dr. Georgina Snell or Dr. Darene Lamer here at this office for discussion of injections.

## 2016-04-06 DIAGNOSIS — N183 Chronic kidney disease, stage 3 unspecified: Secondary | ICD-10-CM | POA: Insufficient documentation

## 2016-04-06 DIAGNOSIS — R630 Anorexia: Secondary | ICD-10-CM | POA: Insufficient documentation

## 2016-04-06 DIAGNOSIS — N179 Acute kidney failure, unspecified: Secondary | ICD-10-CM | POA: Insufficient documentation

## 2016-04-06 DIAGNOSIS — I872 Venous insufficiency (chronic) (peripheral): Secondary | ICD-10-CM | POA: Insufficient documentation

## 2016-04-06 LAB — CBC WITH DIFFERENTIAL/PLATELET
BASOS PCT: 1 %
Basophils Absolute: 114 cells/uL (ref 0–200)
EOS ABS: 342 {cells}/uL (ref 15–500)
EOS PCT: 3 %
HCT: 29.7 % — ABNORMAL LOW (ref 38.5–50.0)
HEMOGLOBIN: 9.6 g/dL — AB (ref 13.2–17.1)
LYMPHS ABS: 2736 {cells}/uL (ref 850–3900)
Lymphocytes Relative: 24 %
MCH: 30.2 pg (ref 27.0–33.0)
MCHC: 32.3 g/dL (ref 32.0–36.0)
MCV: 93.4 fL (ref 80.0–100.0)
MONOS PCT: 11 %
MPV: 9.1 fL (ref 7.5–12.5)
Monocytes Absolute: 1254 cells/uL — ABNORMAL HIGH (ref 200–950)
NEUTROS ABS: 6954 {cells}/uL (ref 1500–7800)
Neutrophils Relative %: 61 %
PLATELETS: 271 10*3/uL (ref 140–400)
RBC: 3.18 MIL/uL — ABNORMAL LOW (ref 4.20–5.80)
RDW: 14.6 % (ref 11.0–15.0)
WBC: 11.4 10*3/uL — ABNORMAL HIGH (ref 3.8–10.8)

## 2016-04-06 LAB — COMPLETE METABOLIC PANEL WITH GFR
ALBUMIN: 3.4 g/dL — AB (ref 3.6–5.1)
ALT: 5 U/L — AB (ref 9–46)
AST: 12 U/L (ref 10–35)
Alkaline Phosphatase: 87 U/L (ref 40–115)
BILIRUBIN TOTAL: 0.3 mg/dL (ref 0.2–1.2)
BUN: 27 mg/dL — ABNORMAL HIGH (ref 7–25)
CO2: 21 mmol/L (ref 20–31)
CREATININE: 1.54 mg/dL — AB (ref 0.70–1.18)
Calcium: 8.6 mg/dL (ref 8.6–10.3)
Chloride: 107 mmol/L (ref 98–110)
GFR, Est African American: 49 mL/min — ABNORMAL LOW (ref >=60)
GFR, Est Non African American: 43 mL/min — ABNORMAL LOW (ref >=60)
GLUCOSE: 99 mg/dL (ref 65–99)
Potassium: 4.5 mmol/L (ref 3.5–5.3)
SODIUM: 141 mmol/L (ref 135–146)
TOTAL PROTEIN: 6.5 g/dL (ref 6.1–8.1)

## 2016-04-06 LAB — IRON AND TIBC
%SAT: 14 % — AB (ref 15–60)
Iron: 29 ug/dL — ABNORMAL LOW (ref 50–180)
TIBC: 210 ug/dL — ABNORMAL LOW (ref 250–425)
UIBC: 181 ug/dL (ref 125–400)

## 2016-04-06 LAB — FERRITIN: Ferritin: 393 ng/mL — ABNORMAL HIGH (ref 20–380)

## 2016-04-12 ENCOUNTER — Telehealth: Payer: Self-pay

## 2016-04-12 DIAGNOSIS — R630 Anorexia: Secondary | ICD-10-CM

## 2016-04-12 NOTE — Telephone Encounter (Signed)
Patient request refill for Megestrol 400 mg because he has lost his appetite. Spouse called the pharmacy and patient has 2 refills left. Dorathea Faerber,CMA

## 2016-04-17 ENCOUNTER — Telehealth: Payer: Self-pay | Admitting: Gastroenterology

## 2016-04-18 MED ORDER — PANTOPRAZOLE SODIUM 40 MG PO TBEC: 40.0000 mg | DELAYED_RELEASE_TABLET | Freq: Every day | ORAL | Status: DC

## 2016-04-18 NOTE — Telephone Encounter (Signed)
Request for Protonix 40 mg 1 BID. Can we refill?

## 2016-04-18 NOTE — Telephone Encounter (Signed)
Notified and aware of refills and new medication directions.

## 2016-04-18 NOTE — Telephone Encounter (Signed)
He no longer needs that medication. I instructed him to stop taking it after the 4/28 EGD showing that the ulcer had healed.  His pharmacy must not be aware of that.  Please let them know he does not need a refill.

## 2016-04-18 NOTE — Telephone Encounter (Signed)
Ok, sure.  Then please send a script for pantoprazole 40 mg once daily.  He does not need it twice a day.  Please send a month supply and 6 refills.

## 2016-04-18 NOTE — Telephone Encounter (Signed)
Pt contacted and spoke to his wife. She states that Henry Ford is having esophageal burning like before, also with some indigestion, they tried to have the Protonix refilled with the primary doctor, they informed her to call us at GI. Please advise.

## 2016-04-30 ENCOUNTER — Encounter: Payer: Self-pay | Admitting: Family

## 2016-04-30 ENCOUNTER — Telehealth: Payer: Self-pay

## 2016-04-30 NOTE — Telephone Encounter (Signed)
Without knowing what kind of heart issues specifically she is worried about, I doubt a referral will be approved by his insurance without a visit with me first - let's have him come in for an appointment and we can talk more about this. ([id number])

## 2016-04-30 NOTE — Telephone Encounter (Signed)
Wife stated the swelling in her husbands legs are getting worse. She suspect that he is having some heart issues as well and would like a referral to an cardiologist. Please advise.

## 2016-05-01 NOTE — Telephone Encounter (Signed)
Wife notified pt have appointment on Thursday

## 2016-05-03 ENCOUNTER — Ambulatory Visit (INDEPENDENT_AMBULATORY_CARE_PROVIDER_SITE_OTHER): Payer: Commercial Managed Care - HMO

## 2016-05-03 ENCOUNTER — Ambulatory Visit (INDEPENDENT_AMBULATORY_CARE_PROVIDER_SITE_OTHER): Payer: Commercial Managed Care - HMO | Admitting: Osteopathic Medicine

## 2016-05-03 ENCOUNTER — Encounter: Payer: Self-pay | Admitting: Osteopathic Medicine

## 2016-05-03 VITALS — BP 127/76 | HR 64 | Temp 98.0°F | Resp 20 | Ht 68.0 in | Wt 155.0 lb

## 2016-05-03 DIAGNOSIS — D6489 Other specified anemias: Secondary | ICD-10-CM

## 2016-05-03 DIAGNOSIS — Z87891 Personal history of nicotine dependence: Secondary | ICD-10-CM

## 2016-05-03 DIAGNOSIS — R0602 Shortness of breath: Secondary | ICD-10-CM | POA: Diagnosis not present

## 2016-05-03 DIAGNOSIS — R531 Weakness: Secondary | ICD-10-CM

## 2016-05-03 DIAGNOSIS — N183 Chronic kidney disease, stage 3 unspecified: Secondary | ICD-10-CM

## 2016-05-03 DIAGNOSIS — R918 Other nonspecific abnormal finding of lung field: Secondary | ICD-10-CM | POA: Diagnosis not present

## 2016-05-03 DIAGNOSIS — R609 Edema, unspecified: Secondary | ICD-10-CM

## 2016-05-03 DIAGNOSIS — R911 Solitary pulmonary nodule: Secondary | ICD-10-CM

## 2016-05-03 DIAGNOSIS — M4855XA Collapsed vertebra, not elsewhere classified, thoracolumbar region, initial encounter for fracture: Secondary | ICD-10-CM

## 2016-05-03 DIAGNOSIS — R634 Abnormal weight loss: Secondary | ICD-10-CM

## 2016-05-03 DIAGNOSIS — J984 Other disorders of lung: Secondary | ICD-10-CM | POA: Diagnosis not present

## 2016-05-03 NOTE — Progress Notes (Signed)
HPI: Henry Ford is a 81 y.o. male who presents to [facility] today for chief complaint of:  Chief Complaint  Patient presents with  . Follow-up    desires cardiology referral     . Context: concerned about breathing, cold all the time, left leg swollen, They're visiting their daughter in [place] and she suggested that he follow-up with a cardiologist, patient and his wife requested referral sore asked them to come into the office talk more about their concerns. Seems to worry him most him most is persistent weakness  . Duration: Generalized weakness has been over past 6 months or so . Assoc signs/symptoms: Anemia, weight loss initially beginning of the year but has been stable past several months, patient undergone procedures for peripheral vascular disease and GI bleed. Today he complains of increasing shortness of breath   Past medical, social and family history reviewed: Past Medical History  Diagnosis Date  . Hypertension   . Prostate enlargement   . Peripheral vascular disease ([place]) 12/2015    non-healing ulcer right Hallux. S/P angioplasty and stenting of right common iliac 01/02/2016.  . Right foot pain 12/30/2015    Admitted to [facility] 12/11/2015, discharge 12/14/2015, diagnoses included acute right great toe infection/cellulitis, peripheral vascular disease. ABI demonstrated arterial occlusive disease worse on left. Is referred outpatient to vascular The CT scan documents for copy of his discharge summary. He should follow up with Dr. Ladona Horns, general surgery at [facility]. No dated 02/06/2  . Anemia 12/2015  . Colon polyps ~ 2001    unknown pathology, colonoscopy done at outside hospital.   . Heme positive stool 12/2015  . Gastrointestinal hemorrhage with melena     Status post EGD and blood transfusion March 2017   . Ischemic foot ulcer due to atherosclerosis of native artery of limb ([place]) 01/10/2016   Past Surgical  History  Procedure Laterality Date  . Hydrocele excision  1956  . Peripheral vascular catheterization N/A 01/02/2016    Procedure: Abdominal Aortogram w/Lower Extremity;  Surgeon: Angelia Mould, MD;  Location: [place] [facility];  Service: Cardiovascular;  Laterality: N/A;  . Peripheral vascular catheterization  01/02/2016    Procedure: Peripheral Vascular Intervention;  Surgeon: Angelia Mould, MD;  Location: [place] [facility];  Service: Cardiovascular;;  . Esophagogastroduodenoscopy N/A 01/21/2016    Procedure: ESOPHAGOGASTRODUODENOSCOPY (EGD);  Surgeon: Irene Shipper, MD;  Location: [facility];  Service: Endoscopy;  Laterality: N/A;   Social History  Substance Use Topics  . Smoking status: Former Smoker -- 0.25 packs/day    Types: Cigarettes    Quit date: 01/18/2016  . Smokeless tobacco: Never Used  . Alcohol Use: 6.0 oz/week    0 Standard drinks or equivalent, 10 Cans of beer per week     Comment: beer   Family History  Problem Relation Age of Onset  . Colon cancer Neg Hx   . Diabetes Brother     Current Outpatient Prescriptions  Medication Sig Dispense Refill  . clopidogrel (PLAVIX) 75 MG tablet Take 1 tablet (75 mg total) by mouth daily. 30 tablet 4  . ferrous sulfate 325 (65 FE) MG tablet Take 65 mg by mouth 2 (two) times daily with a meal.    . finasteride (PROSCAR) 5 MG tablet Take 5 mg by mouth daily. Reported on 02/01/2016    . terazosin (HYTRIN) 10 MG capsule Take 10 mg by mouth at bedtime. Reported on 01/20/2016     No current  facility-administered medications for this visit.   No Known Allergies    Review of Systems: CONSTITUTIONAL:  No  fever, no chills, No recent illness, No unintentional weight changes HEAD/EYES/EARS/NOSE/THROAT: No  headache, no vision change, no hearing change, No sore throat, No  sinus pressure CARDIAC: No  chest pain, No  pressure, No palpitations, No  orthopnea RESPIRATORY: No  cough, No  shortness of  breath/wheeze GASTROINTESTINAL: No  nausea, No  vomiting, No  abdominal pain, No  blood in stool, No  diarrhea, No  constipation  MUSCULOSKELETAL: No  myalgia/arthralgia GENITOURINARY: No  incontinence, No  abnormal genital bleeding/discharge SKIN: No  rash/wounds/concerning lesions HEM/ONC: No  easy bruising/bleeding, No  abnormal lymph node ENDOCRINE: No polyuria/polydipsia/polyphagia, No  heat/cold intolerance  NEUROLOGIC: No  weakness, No  dizziness, No  slurred speech PSYCHIATRIC: No  concerns with depression, No  concerns with anxiety, No sleep problems  Exam:  BP 127/76 mmHg  Pulse 64  Temp(Src) 98 F (36.7 C) (Oral)  Resp 20  Ht '5\' 8"'$  (1.727 m)  Wt 155 lb (70.308 kg)  BMI 23.57 kg/m2  SpO2 99% Constitutional: VS see above. General Appearance: alert, well-developed, well-nourished, NAD Eyes: Normal lids and conjunctive, non-icteric sclera Ears, Nose, Mouth, Throat: MMM, Normal external inspection ears/nares Neck: No masses, trachea midline. No thyroid enlargement. No tenderness/mass appreciated. No lymphadenopathy Respiratory: Normal respiratory effort. no wheeze, no rhonchi, no rales Cardiovascular: S1/S2 normal, no murmur, no rub/gallop auscultated. RRR. Pedal pulse palpable bilaterally DP and PT. (+) lower extremity edema b/l Psychiatric: Normal judgment/insight. Normal mood and affect. Oriented x3.    No results found for this or any previous visit (from the past 72 hour(s)).  Dg Chest 2 View  05/03/2016  CLINICAL DATA:  Shortness of breath since stent placement in February of 2017. EXAM: CHEST  2 VIEW COMPARISON:  None. FINDINGS: Heart size is upper normal. Lungs are hyperexpanded. There is a masslike density within the suprahilar right lung, measuring approximately 3 cm greatest dimension. Smaller nodular densities are seen along the periphery of the left lung there is probable scarring and/or small effusion at the left lung base. There is a compression fracture deformity  at the thoracolumbar junction which is of uncertain age. IMPRESSION: 1. Masslike density within the right upper lung, suprahilar region, measuring approximately 3 cm greatest dimension. There are no prior studies available for comparison. This is concerning for neoplastic mass. Recommend chest CT for further characterization. 2. Additional smaller nodular densities along the periphery of the left lung. These are also suspicious for neoplastic nodules. 3. Hyperexpanded lungs indicating COPD/emphysema. 4. Compression fracture deformity at the thoracolumbar junction, of uncertain age but favored to be chronic. This also could be further characterized with CT. These results will be called to the ordering clinician or representative by the Radiologist Assistant, and communication documented in the PACS or zVision Dashboard. Electronically Signed   By: Franki Cabot M.D.   On: 05/03/2016 17:05     ASSESSMENT/PLAN: Don't think cardiology referral is indicated at this time, my initial plan was to move forward with echocardiogram, no major concerns on EKG today, patient has known peripheral vascular disease suspect dependent edema is more due to this and to cardiac etiology and patient's resistance to wear compression stockings. Given x-ray results as noted above, I called the patient and his wife after they left the office and I received the official report, see that result for details, proceed with workup for lung mass, at this point I did not  discuss the possibility of cancer with the patient's wife, would rather go over this face-to-face after he has completed this CT.   General weakness  Dependent edema - Reinforced recommendation for compression stockings  Shortness of breath - Plan: ECHOCARDIOGRAM COMPLETE, DG Chest 2 View  Anemia due to other cause - Patient declines blood draw today for any reason. He is taking iron but persistent anemia concerns me  Chronic kidney disease, stage 3 (moderate)  Lung  nodule - CT pending, suspicion for possible malignancy given patient's smoking history  Former smoker    All questions were answered. Visit summary with medication list and pertinent instructions was printed for patient to review. ER/RTC precautions were reviewed with the patient. Return in about 4 weeks (around 05/31/2016), or sooner if needed, for LABS (Sorry, Mr. Manke) [place].

## 2016-05-04 NOTE — Addendum Note (Signed)
Addended by: Huel Cote on: 05/04/2016 04:12 PM   Modules accepted: Orders

## 2016-05-04 NOTE — Progress Notes (Signed)
EKG interpretation: Rate: 71 Rhythm: sinus No ST/T changes concerning for acute ischemia/infarct  T wave inversions - new in V3

## 2016-05-07 ENCOUNTER — Ambulatory Visit (HOSPITAL_BASED_OUTPATIENT_CLINIC_OR_DEPARTMENT_OTHER)
Admission: RE | Admit: 2016-05-07 | Discharge: 2016-05-07 | Disposition: A | Discharge status: Home / Self Care | Payer: Commercial Managed Care - HMO | Source: Ambulatory Visit | Attending: Osteopathic Medicine | Admitting: Osteopathic Medicine

## 2016-05-07 ENCOUNTER — Telehealth: Payer: Self-pay

## 2016-05-07 ENCOUNTER — Telehealth: Payer: Self-pay | Admitting: Osteopathic Medicine

## 2016-05-07 DIAGNOSIS — I2699 Other pulmonary embolism without acute cor pulmonale: Secondary | ICD-10-CM

## 2016-05-07 DIAGNOSIS — R938 Abnormal findings on diagnostic imaging of other specified body structures: Secondary | ICD-10-CM | POA: Diagnosis not present

## 2016-05-07 DIAGNOSIS — R0602 Shortness of breath: Secondary | ICD-10-CM | POA: Diagnosis not present

## 2016-05-07 DIAGNOSIS — R918 Other nonspecific abnormal finding of lung field: Secondary | ICD-10-CM | POA: Insufficient documentation

## 2016-05-07 DIAGNOSIS — R634 Abnormal weight loss: Secondary | ICD-10-CM | POA: Insufficient documentation

## 2016-05-07 MED ORDER — APIXABAN 5 MG PO TABS: ORAL_TABLET | ORAL | Status: DC

## 2016-05-07 MED ORDER — IOPAMIDOL (ISOVUE-300) INJECTION 61%
75.0000 mL | Freq: Once | INTRAVENOUS | Status: AC | PRN
  Administered 2016-05-07: 75 mL via INTRAVENOUS

## 2016-05-07 NOTE — Telephone Encounter (Signed)
Spoke to patient's wife over the phone, let her know that the CT scan did show a pulmonary embolus, I did not mention my concern for lung malignancy but I did ask them to come into the office tomorrow so that we can talk about other concerning findings on the CT. She reports that the patient is overall doing a little bit better, still having some trouble getting winded, still feeling cold and tired, he absolutely does not want to go back to the emergency room. Advised on ER precautions if he is worse before I see him tomorrow. Patient is at high risk of morbidity/mortality and decompensation, PESI score of 5, high OBRI bleed risk, HEMORRH2AGES score 6 = 12.3%/year risk bleed, but given lower extremity swelling I am also concern for possible DVT which may embolize to the pulmonary vasculature - discussion of risks versus benefit of anticoagulation with the wife, she is fine with anticoagulation so will switch to L Quiros, we'll need to get updated renal function labs tomorrow. Advised that if the patient is feeling any worse in terms of breathing, chest pain, or other concerns, needs breast to the nearest hospital. Otherwise will review images with patient and his wife tomorrow and go over my concern for malignancy, could also consider IVC filter.

## 2016-05-07 NOTE — Telephone Encounter (Signed)
Imaging called stated that patient had a CT scan and patient has PE and DVT of both lower Extremeties. Rhonda Cunningham,CMA

## 2016-05-08 ENCOUNTER — Ambulatory Visit (HOSPITAL_BASED_OUTPATIENT_CLINIC_OR_DEPARTMENT_OTHER)
Admission: RE | Admit: 2016-05-08 | Discharge: 2016-05-08 | Disposition: A | Discharge status: Home / Self Care | Payer: Commercial Managed Care - HMO | Source: Ambulatory Visit | Attending: Osteopathic Medicine | Admitting: Osteopathic Medicine

## 2016-05-08 ENCOUNTER — Encounter: Payer: Self-pay | Admitting: Osteopathic Medicine

## 2016-05-08 ENCOUNTER — Ambulatory Visit (INDEPENDENT_AMBULATORY_CARE_PROVIDER_SITE_OTHER): Payer: Commercial Managed Care - HMO | Admitting: Osteopathic Medicine

## 2016-05-08 VITALS — BP 121/72 | HR 78 | Wt 156.0 lb

## 2016-05-08 DIAGNOSIS — I2699 Other pulmonary embolism without acute cor pulmonale: Secondary | ICD-10-CM | POA: Diagnosis not present

## 2016-05-08 DIAGNOSIS — Z79899 Other long term (current) drug therapy: Secondary | ICD-10-CM | POA: Diagnosis not present

## 2016-05-08 DIAGNOSIS — N183 Chronic kidney disease, stage 3 unspecified: Secondary | ICD-10-CM

## 2016-05-08 DIAGNOSIS — I82493 Acute embolism and thrombosis of other specified deep vein of lower extremity, bilateral: Secondary | ICD-10-CM

## 2016-05-08 DIAGNOSIS — R918 Other nonspecific abnormal finding of lung field: Secondary | ICD-10-CM | POA: Diagnosis not present

## 2016-05-08 DIAGNOSIS — I82413 Acute embolism and thrombosis of femoral vein, bilateral: Secondary | ICD-10-CM | POA: Diagnosis not present

## 2016-05-08 DIAGNOSIS — I82433 Acute embolism and thrombosis of popliteal vein, bilateral: Secondary | ICD-10-CM | POA: Insufficient documentation

## 2016-05-08 DIAGNOSIS — D72829 Elevated white blood cell count, unspecified: Secondary | ICD-10-CM

## 2016-05-08 DIAGNOSIS — D6489 Other specified anemias: Secondary | ICD-10-CM

## 2016-05-08 DIAGNOSIS — E46 Unspecified protein-calorie malnutrition: Secondary | ICD-10-CM

## 2016-05-08 NOTE — Progress Notes (Signed)
HPI: Henry Ford is a 81 y.o. male who presents to [place] today for chief complaint of:  Chief Complaint  Patient presents with  . Follow-up    CT RESULTS     . Context: Recent CT (+) PE/DVT and lung mass, here to discuss results.  . Quality: masses appear malignant, extensive DVT . Modifying factors: Stopped Plavix, started Eliquis based on risk of GI bleed - see note from yesterday. Seen last week - he was recently visiting daughter in [place] and noted some swelling on that trip. Hx PVD and stenting to R common iliac. Hx GI bleed few months ago.  . Assoc signs/symptoms: fatigue, LE swelling is worse on L, no CP, stable SOB   Past medical, social and family history reviewed: Past Medical History  Diagnosis Date  . Hypertension   . Prostate enlargement   . Peripheral vascular disease ([place]) 12/2015    non-healing ulcer right Hallux. S/P angioplasty and stenting of right common iliac 01/02/2016.  . Right foot pain 12/30/2015    Admitted to [facility] 12/11/2015, discharge 12/14/2015, diagnoses included acute right great toe infection/cellulitis, peripheral vascular disease. ABI demonstrated arterial occlusive disease worse on left. Is referred outpatient to vascular The CT scan documents for copy of his discharge summary. He should follow up with Dr. Ladona Horns, general surgery at [facility]. No dated 02/06/2  . Anemia 12/2015  . Colon polyps ~ 2001    unknown pathology, colonoscopy done at outside hospital.   . Heme positive stool 12/2015  . Gastrointestinal hemorrhage with melena     Status post EGD and blood transfusion March 2017   . Ischemic foot ulcer due to atherosclerosis of native artery of limb () 01/10/2016   Past Surgical History  Procedure Laterality Date  . Hydrocele excision  1956  . Peripheral vascular catheterization N/A 01/02/2016    Procedure: Abdominal Aortogram w/Lower Extremity;  Surgeon: Angelia Mould, MD;  Location: [facility];  Service: Cardiovascular;  Laterality: N/A;  . Peripheral vascular catheterization  01/02/2016    Procedure: Peripheral Vascular Intervention;  Surgeon: Angelia Mould, MD;  Location: [place] [facility];  Service: Cardiovascular;;  . Esophagogastroduodenoscopy N/A 01/21/2016    Procedure: ESOPHAGOGASTRODUODENOSCOPY (EGD);  Surgeon: Irene Shipper, MD;  Location: [facility];  Service: Endoscopy;  Laterality: N/A;   Social History  Substance Use Topics  . Smoking status: Former Smoker -- 0.25 packs/day    Types: Cigarettes    Quit date: 01/18/2016  . Smokeless tobacco: Never Used  . Alcohol Use: 6.0 oz/week    0 Standard drinks or equivalent, 10 Cans of beer per week     Comment: beer   Family History  Problem Relation Age of Onset  . Colon cancer Neg Hx   . Diabetes Brother     Current Outpatient Prescriptions  Medication Sig Dispense Refill  . apixaban (ELIQUIS) 5 MG TABS tablet Start taking 2 tablet (10 mg total) by mouth twice per day for severn days, then take 1 tablet (5 mg total) by mouth twice per day 60 tablet 0  . ferrous sulfate 325 (65 FE) MG tablet Take 65 mg by mouth 2 (two) times daily with a meal.    . finasteride (PROSCAR) 5 MG tablet Take 5 mg by mouth daily. Reported on 02/01/2016    . terazosin (HYTRIN) 10 MG capsule Take 10 mg by mouth at bedtime. Reported on 01/20/2016     No current facility-administered  medications for this visit.   No Known Allergies    Review of Systems: CONSTITUTIONAL:  No  fever,  CARDIAC: No  chest pain, No  pressure, No palpitations RESPIRATORY: (+) chronic smokers cough, (+) shortness of breath/wheeze GASTROINTESTINAL: No  nausea, No black/tarry stool, No  abdominal pain NEUROLOGIC: (+) generalized weakness  Exam:  BP 121/72 mmHg  Pulse 78  Wt 156 lb (70.761 kg) Constitutional: VS see above. General Appearance: alert, well-developed, well-nourished, NAD Cardiovascular: S1/S2 normal,  no murmur, no rub/gallop auscultated. RRR. (+)3 lower extremity edema LLE extending proximal to knee. Psychiatric: Normal judgment/insight. Depressed mood and affect. Oriented x3.     Ct Chest, Abdomen, Pelvis W Contrast (Results reviewed in detail with patient and wife)   05/07/2016  CLINICAL DATA:  Shortness of breath with difficulty breathing and weight loss. Abnormal chest x-ray with findings worrisome for neoplasm. No given history of malignancy. EXAM: CT CHEST, ABDOMEN, AND PELVIS WITH CONTRAST TECHNIQUE: Multidetector CT imaging of the chest, abdomen and pelvis was performed following the standard protocol during bolus administration of intravenous contrast. CONTRAST:  53m ISOVUE-300 IOPAMIDOL (ISOVUE-300) INJECTION 61% COMPARISON:  Chest radiograph 05/03/2016. FINDINGS: CT CHEST Cardiovascular: The heart size is normal. There is no pericardial effusion. There is atherosclerosis of the aorta, great vessels and coronary arteries. Although not performed as a CTA, there is evidence of acute/subacute pulmonary embolism within the left lower lobe pulmonary artery. Mediastinum/Nodes: There are multiple enlarged mediastinal and right hilar lymph nodes. There is a dominant precarinal nodal mass measuring 3.9 x 2.4 cm on image 28. There is a right hilar node measuring up to 1.7 cm on image 32. The thyroid gland, trachea and esophagus demonstrate no significant findings. Lungs/Pleura: There is no pleural effusion. This dominant spiculated right upper lobe lesion measuring 3.2 x 2.6 cm on image 59, likely a primary lung cancer. There are numerous other pulmonary nodules bilaterally suspicious for metastatic disease. The largest of these other nodules measures 14 x 13 mm in the left lower lobe on image 121. There are multiple other smaller lesions, more than 10 in each lung. Musculoskeletal/Chest wall: No chest wall mass or suspicious osseous findings. There is a mild superior endplate compression deformity at T8  which appears benign. CT ABDOMEN AND PELVIS FINDINGS Hepatobiliary: The liver demonstrates mild contour irregularity, but no focal abnormality. No evidence of gallstones, gallbladder wall thickening or biliary dilatation. Pancreas: Unremarkable. No pancreatic ductal dilatation or surrounding inflammatory changes. Spleen: Normal in size without focal abnormality. Adrenals/Urinary Tract: 15 mm left adrenal nodule measures 5 HU on the delayed images, likely an incidental adenoma. The right adrenal gland appears normal. 5.1 cm lesion projecting from the lower pole of the left kidney measures slightly higher than water density (21 -23 HU), although is probably a cyst. There are other smaller low-density renal lesions which are probably cysts as well. No definite enhancing renal mass or hydronephrosis. There is no evidence of ureteral calculus. The bladder is heavily trabeculated. There is a calculus within a posterior bladder diverticulum. Stomach/Bowel: No evidence of bowel wall thickening, distention or surrounding inflammatory change. The stomach is decompressed. Vascular/Lymphatic: There are no enlarged abdominal or pelvic lymph nodes. There is extensive aortic and branch vessel atherosclerosis. There is nearly occlusive thrombus within the left femoral vein. This extends into the left external iliac vein. There is also nonocclusive thrombus in the right femoral vein. The IVC appears patent. Reproductive: There are extensive calcifications throughout the prostate gland. Other: No ascites or peritoneal nodularity.  Musculoskeletal: No acute or significant osseous findings. There is a superior endplate compression deformity at L1 with an associated Schmorl's node which appears benign. Degenerative changes are present in the lower lumbar spine. IMPRESSION: 1. Findings are consistent with right upper lobe lung cancer with multiple enlarged mediastinal and right hilar lymph nodes as well as numerous bilateral pulmonary  nodules consistent with metastases. 2. No definite evidence of metastatic disease within the abdomen or pelvis. 3. Bilateral DVT with thromboembolic disease (pulmonary embolism) within the left pulmonary artery. 4. Other incidental findings include the presence of extensive atherosclerosis, trabeculated bladder containing a calculus, probable incidental left adrenal adenoma, bilateral renal cysts, and chronic appearing thoracolumbar compression deformities. 5. Critical Value/emergent results were called by telephone at the time of interpretation on 05/07/2016 at 4:38 pm to [facility], [place] for Dr. [name] , who verbally acknowledged these results. Electronically Signed   By: Richardean Sale M.D.   On: 05/07/2016 16:39    US Venous Img Lower Bilateral  05/08/2016  CLINICAL DATA:  History of lung carcinoma. Thrombus seen in the femoral veins bilateral recent CT scan. EXAM: BILATERAL LOWER EXTREMITY VENOUS DOPPLER ULTRASOUND TECHNIQUE: Gray-scale sonography with graded compression, as well as color Doppler and duplex ultrasound were performed to evaluate the lower extremity deep venous systems from the level of the common femoral vein and including the common femoral, femoral, profunda femoral, popliteal and calf veins including the posterior tibial, peroneal and gastrocnemius veins when visible. The superficial great saphenous vein was also interrogated. Spectral Doppler was utilized to evaluate flow at rest and with distal augmentation maneuvers in the common femoral, femoral and popliteal veins. COMPARISON:  CT chest, abdomen and pelvis 05/07/2016. FINDINGS: RIGHT LOWER EXTREMITY Common Femoral Vein: Partially occlusive thrombus identified. Saphenofemoral Junction: Partially occlusive thrombus identified. Profunda Femoral Vein: Partially occlusive thrombus identified. Femoral Vein: Occlusive thrombus identified. Popliteal Vein: Occlusive thrombus identified. Calf Veins: Partially occlusive thrombus is seen  in the posterior tibial vein. Peroneal vein is not visualized. Thrombus is also identified in the gastrocnemius vein. Lesser saphenous Vein: Thrombus identified. Other Findings:  None. LEFT LOWER EXTREMITY Common Femoral Vein: Occlusive thrombus identified. Saphenofemoral Junction: Occlusive thrombus identified. Profunda Femoral Vein: Occlusive thrombus identified. Femoral Vein: Occlusive thrombus identified. Popliteal Vein: Occlusive thrombus identified. Calf Veins: Not visualized. Superficial Great Saphenous Vein: Not reported. Other Findings:  None. IMPRESSION: Extensive bilateral deep venous thrombosis. In the left leg, occlusive clot is seen from the common femoral through the popliteal vein. On the right, occlusive thrombus is seen from the femoral through the popliteal vein. Critical Value/emergent results were called by telephone at the time of interpretation on 05/08/2016 at 3:47 pm to Dr. [name] , who verbally acknowledged these results. Electronically Signed   By: Inge Rise M.D.   On: 05/08/2016 15:49     ASSESSMENT/PLAN: High risk morbidity/mortality. Urgent consultation to HemOnc and I am attempting to reach vascular surgery team for recommendations on possible interventions for extensive DVT and consideration of IVC filter. Imaging and plan reviewed with patient and his wife, all questions answered.    Lung mass - Plan: Ambulatory referral to Hematology / Oncology   Other acute pulmonary embolism without acute cor pulmonale (HCC) - continue Eliquis   Deep vein thrombosis (DVT) of other vein of both lower extremities, unspecified chronicity ([place]) - Plan: US Venous Img Lower Bilateral to eval for extent of DVT, pt already on anticoagulation but perhaps this can give some more info for vascular surgery if any other  procedure is considered.   ADDENDED: Paged vascular surgery at 3:59 PM and left message with someone in the OR re: above patient. Dr Trula Slade and I spoke at 6:03  PM and he reviewed Mr. Roh' case and advised no additional urgent interventions needed and would not need IVC filter unless the patient develops another bleed on anticoagulation, patient can follow-up with Dr. Scot Dock in the office 05/09/2016.    Medication management - Plan: CBC with Differential/Platelet, COMPLETE METABOLIC PANEL WITH GFR   Chronic kidney disease, stage 3 (moderate) - stable renal function   Anemia due to other cause   Leukocytosis - c/w cancer however precautions given for infection symptoms to watch out for   Protein-calorie malnutrition [facility])     Results for orders placed or performed in visit on 05/08/16 (from the past 24 hour(s))  CBC with Differential/Platelet     Status: Abnormal   Collection Time: 05/08/16 11:54 AM  Result Value Ref Range   WBC 12.8 (H) 3.8 - 10.8 K/uL   RBC 3.19 (L) 4.20 - 5.80 MIL/uL   Hemoglobin 9.3 (L) 13.2 - 17.1 g/dL   HCT 28.8 (L) 38.5 - 50.0 %   MCV 90.3 80.0 - 100.0 fL   MCH 29.2 27.0 - 33.0 pg   MCHC 32.3 32.0 - 36.0 g/dL   RDW 14.8 11.0 - 15.0 %   Platelets 251 140 - 400 K/uL   MPV 9.1 7.5 - 12.5 fL   Neutro Abs 7808 (H) 1500 - 7800 cells/uL   Lymphs Abs 3200 850 - 3900 cells/uL   Monocytes Absolute 1280 (H) 200 - 950 cells/uL   Eosinophils Absolute 384 15 - 500 cells/uL   Basophils Absolute 128 0 - 200 cells/uL   Neutrophils Relative % 61 %   Lymphocytes Relative 25 %   Monocytes Relative 10 %   Eosinophils Relative 3 %   Basophils Relative 1 %   Smear Review SEE NOTE    Narrative   Performed at:  [address], [address]                [place], [place] [address]  COMPLETE METABOLIC PANEL WITH GFR     Status: Abnormal   Collection Time: 05/08/16 11:54 AM  Result Value Ref Range   Sodium 142 135 - 146 mmol/L   Potassium 4.3 3.5 - 5.3 mmol/L   Chloride 110 98 - 110 mmol/L   CO2 18 (L) 20 - 31 mmol/L   Glucose, Bld 87 65 - 99 mg/dL   BUN 25 7 - 25 mg/dL   Creat 1.32 (H)  0.70 - 1.18 mg/dL   Total Bilirubin 0.3 0.2 - 1.2 mg/dL   Alkaline Phosphatase 70 40 - 115 U/L   AST 14 10 - 35 U/L   ALT 9 9 - 46 U/L   Total Protein 6.7 6.1 - 8.1 g/dL   Albumin 3.4 (L) 3.6 - 5.1 g/dL   Calcium 8.3 (L) 8.6 - 10.3 mg/dL   GFR, Est African American 59 (L) >=60 mL/min   GFR, Est Non African American 51 (L) >=60 mL/min   Narrative   Performed at:  [address], [address]                [place], [place] [address] SOURCE: BLD&BLOOD;  SOURCE: BLD&BLOOD         All questions were answered. Visit summary with medication list and pertinent instructions was printed for patient to review. ER/RTC precautions were reviewed with the patient. Return in about 6 weeks (around 06/19/2016), or sooner if needed, for FOLLOW-UP ON PROGRESS WITH CANCER WORKUP/TREATMENT.

## 2016-05-08 NOTE — Patient Instructions (Addendum)
2:30 appointment at [facility] for ultrasound - I will call you and Dr Scot Dock with the results. Keep your appointment with Dr. Scot Dock tomorrow.    While you are at the [facility], you may be able to go to the Hematology-Oncology office and see when they can schedule you to be seen by a cancer specialist - this may be faster than waiting for them to call you.   Please make sure you are sharing my contact information with all you specialists so they can forward your records to me!   Please let me know if you have ANY questions or concerns about ANYTHING!

## 2016-05-08 NOTE — Telephone Encounter (Signed)
Patient has appointment with PCP today. Henry Ford,CMA

## 2016-05-09 ENCOUNTER — Encounter: Payer: Self-pay | Admitting: Vascular Surgery

## 2016-05-09 ENCOUNTER — Ambulatory Visit (INDEPENDENT_AMBULATORY_CARE_PROVIDER_SITE_OTHER): Payer: Commercial Managed Care - HMO | Admitting: Vascular Surgery

## 2016-05-09 VITALS — BP 131/65 | HR 66 | Ht 68.0 in | Wt 156.0 lb

## 2016-05-09 DIAGNOSIS — I82409 Acute embolism and thrombosis of unspecified deep veins of unspecified lower extremity: Secondary | ICD-10-CM | POA: Insufficient documentation

## 2016-05-09 DIAGNOSIS — E46 Unspecified protein-calorie malnutrition: Secondary | ICD-10-CM | POA: Insufficient documentation

## 2016-05-09 DIAGNOSIS — I2699 Other pulmonary embolism without acute cor pulmonale: Secondary | ICD-10-CM | POA: Insufficient documentation

## 2016-05-09 DIAGNOSIS — I739 Peripheral vascular disease, unspecified: Secondary | ICD-10-CM | POA: Diagnosis not present

## 2016-05-09 DIAGNOSIS — R918 Other nonspecific abnormal finding of lung field: Secondary | ICD-10-CM | POA: Insufficient documentation

## 2016-05-09 LAB — COMPLETE METABOLIC PANEL WITH GFR
ALT: 9 U/L (ref 9–46)
AST: 14 U/L (ref 10–35)
Albumin: 3.4 g/dL — ABNORMAL LOW (ref 3.6–5.1)
Alkaline Phosphatase: 70 U/L (ref 40–115)
BILIRUBIN TOTAL: 0.3 mg/dL (ref 0.2–1.2)
BUN: 25 mg/dL (ref 7–25)
CHLORIDE: 110 mmol/L (ref 98–110)
CO2: 18 mmol/L — AB (ref 20–31)
CREATININE: 1.32 mg/dL — AB (ref 0.70–1.18)
Calcium: 8.3 mg/dL — ABNORMAL LOW (ref 8.6–10.3)
GFR, EST AFRICAN AMERICAN: 59 mL/min — AB (ref >=60)
GFR, Est Non African American: 51 mL/min — ABNORMAL LOW (ref >=60)
GLUCOSE: 87 mg/dL (ref 65–99)
Potassium: 4.3 mmol/L (ref 3.5–5.3)
SODIUM: 142 mmol/L (ref 135–146)
TOTAL PROTEIN: 6.7 g/dL (ref 6.1–8.1)

## 2016-05-09 LAB — CBC WITH DIFFERENTIAL/PLATELET
Basophils Absolute: 128 cells/uL (ref 0–200)
Basophils Relative: 1 %
EOS PCT: 3 %
Eosinophils Absolute: 384 cells/uL (ref 15–500)
HCT: 28.8 % — ABNORMAL LOW (ref 38.5–50.0)
Hemoglobin: 9.3 g/dL — ABNORMAL LOW (ref 13.2–17.1)
LYMPHS PCT: 25 %
Lymphs Abs: 3200 cells/uL (ref 850–3900)
MCH: 29.2 pg (ref 27.0–33.0)
MCHC: 32.3 g/dL (ref 32.0–36.0)
MCV: 90.3 fL (ref 80.0–100.0)
MONOS PCT: 10 %
MPV: 9.1 fL (ref 7.5–12.5)
Monocytes Absolute: 1280 cells/uL — ABNORMAL HIGH (ref 200–950)
NEUTROS ABS: 7808 {cells}/uL — AB (ref 1500–7800)
Neutrophils Relative %: 61 %
PLATELETS: 251 10*3/uL (ref 140–400)
RBC: 3.19 MIL/uL — AB (ref 4.20–5.80)
RDW: 14.8 % (ref 11.0–15.0)
WBC: 12.8 10*3/uL — AB (ref 3.8–10.8)

## 2016-05-09 NOTE — Progress Notes (Signed)
HISTORY AND PHYSICAL     CC:  Follow up on stent Referring Provider:  [name], DO  HPI: This is a 81 y.o. male who is s/p right common iliac angioplasty and stenting on 01/02/16.  He presented for follow up back in March at which time his ABI's were 0.92 on the right and 1.03 on the left.   His wound on his right great toe is healing.  He states that he was just diagnosed with lung cancer.  He started having swelling of the left leg.  A venous duplex was obtained and he does have extensive DVT in the left leg as well as the right leg.  His Plavix was discontinued and he is now started on Elquis as of yesterday.    Past Medical History  Diagnosis Date  . Hypertension   . Prostate enlargement   . Peripheral vascular disease ([place]) 12/2015    non-healing ulcer right Hallux. S/P angioplasty and stenting of right common iliac 01/02/2016.  . Right foot pain 12/30/2015    Admitted to [facility] 12/11/2015, discharge 12/14/2015, diagnoses included acute right great toe infection/cellulitis, peripheral vascular disease. ABI demonstrated arterial occlusive disease worse on left. Is referred outpatient to vascular The CT scan documents for copy of his discharge summary. He should follow up with Dr. Ladona Horns, general surgery at [facility]. No dated 02/06/2  . Anemia 12/2015  . Colon polyps ~ 2001    unknown pathology, colonoscopy done at outside hospital.   . Heme positive stool 12/2015  . Gastrointestinal hemorrhage with melena     Status post EGD and blood transfusion March 2017   . Ischemic foot ulcer due to atherosclerosis of native artery of limb ([place]) 01/10/2016  . Cancer ([place])     lung    Past Surgical History  Procedure Laterality Date  . Hydrocele excision  1956  . Peripheral vascular catheterization N/A 01/02/2016    Procedure: Abdominal Aortogram w/Lower Extremity;  Surgeon: Angelia Mould, MD;  Location: [place] [facility];  Service: Cardiovascular;   Laterality: N/A;  . Peripheral vascular catheterization  01/02/2016    Procedure: Peripheral Vascular Intervention;  Surgeon: Angelia Mould, MD;  Location: [place] [facility];  Service: Cardiovascular;;  . Esophagogastroduodenoscopy N/A 01/21/2016    Procedure: ESOPHAGOGASTRODUODENOSCOPY (EGD);  Surgeon: Irene Shipper, MD;  Location: [facility];  Service: Endoscopy;  Laterality: N/A;    No Known Allergies  Current Outpatient Prescriptions  Medication Sig Dispense Refill  . apixaban (ELIQUIS) 5 MG TABS tablet Start taking 2 tablet (10 mg total) by mouth twice per day for severn days, then take 1 tablet (5 mg total) by mouth twice per day 60 tablet 0  . ferrous sulfate 325 (65 FE) MG tablet Take 65 mg by mouth 2 (two) times daily with a meal.    . finasteride (PROSCAR) 5 MG tablet Take 5 mg by mouth daily. Reported on 02/01/2016    . terazosin (HYTRIN) 10 MG capsule Take 10 mg by mouth at bedtime. Reported on 01/20/2016     No current facility-administered medications for this visit.    Family History  Problem Relation Age of Onset  . Colon cancer Neg Hx   . Diabetes Brother     Social History   Social History  . Marital Status: Married    Spouse Name: Gwyndolyn Saxon  . Number of Children: 2  . Years of Education: N/A   Occupational History  . retired    Social History  Main Topics  . Smoking status: Former Smoker -- 0.25 packs/day    Types: Cigarettes    Quit date: 01/18/2016  . Smokeless tobacco: Never Used  . Alcohol Use: 6.0 oz/week    0 Standard drinks or equivalent, 10 Cans of beer per week     Comment: beer  . Drug Use: No  . Sexual Activity: Not on file   Other Topics Concern  . Not on file   Social History Narrative     REVIEW OF SYSTEMS:   '[X]'$  denotes positive finding, '[ ]'$  denotes negative finding Cardiac  Comments:  Chest pain or chest pressure:    Shortness of breath upon exertion: x   Short of breath when lying flat: x   Irregular heart rhythm:          Vascular    Pain in calf, thigh, or hip brought on by ambulation:    Pain in feet at night that wakes you up from your sleep:     Blood clot in your veins: x   Leg swelling:  x       Pulmonary    Oxygen at home:    Productive cough:     Wheezing:         Neurologic    Sudden weakness in arms or legs:     Sudden numbness in arms or legs:     Sudden onset of difficulty speaking or slurred speech:    Temporary loss of vision in one eye:     Problems with dizziness:         Gastrointestinal    Blood in stool:     Vomited blood:         Genitourinary    Burning when urinating:     Blood in urine:        Psychiatric    Major depression:         Hematologic    Bleeding problems:    Problems with blood clotting too easily: x       Skin    Rashes or ulcers:        Constitutional    Fever or chills:    Feeling cold all the time x     PHYSICAL EXAMINATION:  Filed Vitals:   05/09/16 1457  BP: 131/65  Pulse: 66   Body mass index is 23.73 kg/(m^2).  General:  Frail appearing in NAD; vital signs documented above Gait: Not observed HENT: WNL, normocephalic Pulmonary: normal non-labored breathing , without Rales, rhonchi,  wheezing Cardiac: regular HR Skin: without rashes Vascular Exam/Pulses:  Right  Femoral 2+ (normal)  AT biphasic  PT monophasic  Peroneal monophasic   Extremities: without ischemic changes , without cellulitis; without open wounds; right great toe with eschar that appears to be raising up Musculoskeletal: no muscle wasting or atrophy  Neurologic: A&O X 3;  No focal weakness or paresthesias are detected    Non-Invasive Vascular Imaging:   None today  Pt meds includes: Statin:  No. Beta Blocker:  No. Aspirin:  No. ACEI:  No. ARB:  No. Other Antiplatelet/Anticoagulant:  Yes.   Eliquis   ASSESSMENT/PLAN:: 81 y.o. male who is s/p angioplasty and stenting of the right common iliac artery February 2017   -right great toe is healing.   The eschar should fall off and continue healing as he has  + doppler signals in the right foot.   -he has been taken off of the Plavix as of yesterday and placed  on Eliquis for newly diagnosis of bilateral lower extremity DVT's/PE -diagnosed with lung cancer this week and has appointment at the cancer center on Monday. -pt will f/u with Dr. Scot Dock in 3 months with duplex of the right iliac stent and ABI's -he will call sooner if needed.   Leontine Locket, PA-C [facility] [phone]  Clinic MD:  Pt seen and examined in conjunction with Dr. Scot Dock

## 2016-05-10 ENCOUNTER — Encounter: Payer: Self-pay | Admitting: Hematology & Oncology

## 2016-05-10 ENCOUNTER — Other Ambulatory Visit: Payer: Self-pay | Admitting: Hematology & Oncology

## 2016-05-10 ENCOUNTER — Ambulatory Visit: Payer: Commercial Managed Care - HMO

## 2016-05-10 ENCOUNTER — Ambulatory Visit (HOSPITAL_BASED_OUTPATIENT_CLINIC_OR_DEPARTMENT_OTHER): Payer: Commercial Managed Care - HMO | Admitting: Hematology & Oncology

## 2016-05-10 ENCOUNTER — Other Ambulatory Visit (HOSPITAL_BASED_OUTPATIENT_CLINIC_OR_DEPARTMENT_OTHER): Payer: Commercial Managed Care - HMO

## 2016-05-10 VITALS — BP 128/59 | HR 67 | Temp 98.0°F | Resp 14 | Ht 68.0 in | Wt 156.0 lb

## 2016-05-10 DIAGNOSIS — R918 Other nonspecific abnormal finding of lung field: Secondary | ICD-10-CM

## 2016-05-10 DIAGNOSIS — C3490 Malignant neoplasm of unspecified part of unspecified bronchus or lung: Secondary | ICD-10-CM

## 2016-05-10 DIAGNOSIS — I82401 Acute embolism and thrombosis of unspecified deep veins of right lower extremity: Secondary | ICD-10-CM | POA: Diagnosis not present

## 2016-05-10 DIAGNOSIS — I82402 Acute embolism and thrombosis of unspecified deep veins of left lower extremity: Secondary | ICD-10-CM

## 2016-05-10 DIAGNOSIS — I2699 Other pulmonary embolism without acute cor pulmonale: Secondary | ICD-10-CM

## 2016-05-10 DIAGNOSIS — R634 Abnormal weight loss: Secondary | ICD-10-CM

## 2016-05-10 LAB — COMPREHENSIVE METABOLIC PANEL
ALBUMIN: 2.7 g/dL — AB (ref 3.5–5.0)
ALK PHOS: 79 U/L (ref 40–150)
ALT: 12 U/L (ref 0–55)
ANION GAP: 10 meq/L (ref 3–11)
AST: 22 U/L (ref 5–34)
BILIRUBIN TOTAL: 0.4 mg/dL (ref 0.20–1.20)
BUN: 26.4 mg/dL — ABNORMAL HIGH (ref 7.0–26.0)
CALCIUM: 8.5 mg/dL (ref 8.4–10.4)
CO2: 19 mEq/L — ABNORMAL LOW (ref 22–29)
CREATININE: 1.3 mg/dL (ref 0.7–1.3)
Chloride: 114 mEq/L — ABNORMAL HIGH (ref 98–109)
EGFR: 50 mL/min/{1.73_m2} — ABNORMAL LOW (ref >90)
Glucose: 107 mg/dl (ref 70–140)
Potassium: 4.3 mEq/L (ref 3.5–5.1)
Sodium: 143 mEq/L (ref 136–145)
TOTAL PROTEIN: 6.8 g/dL (ref 6.4–8.3)

## 2016-05-10 LAB — LACTATE DEHYDROGENASE: LDH: 335 U/L — ABNORMAL HIGH (ref 125–245)

## 2016-05-10 NOTE — Progress Notes (Signed)
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   Chief Complaint  Patient presents with  . Follow-up  : I just don't feel well.  HPI: Henry Ford is a very nice 81 year old white male. He is originally from [facility]. He was in the [employer] for a couple years. I thanked him for his service to our country.  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 He initially worked down to [employer] for [employer] before they went under. He then worked for [employer] in [place]. He's been appear for about 20 years or so.  He's been using some weight. He's lost about 25 pounds over the past few months.  He apparently ha has had vascular issues. He has peripheral vascular disease. He had a black toe I think on his right foot. Back in February, he had angioplasty and stenting of the right common iliac artery. He was placed on aspirin and Plavix.  He then proceeded to have GI bleeding. His hemoglobin went down to 8.3. He was endoscoped. He was found have some upper GI ulcers. His whole that he had some duodenal ulcers.  He continued to have some decline. He was not eating all that well. He continued to have some problems with his legs. Low leg swelling.  He was found to have deep vein thrombosis in both legs. He also was found to have pulmonary emboli. More importantly however is that on CT scan of his chest, he was found to have numerous enlarged mediastinal and right hilar lymph nodes. He had a large precarinal mass measuring 3.9 x 2.4 cm. In her right hilar lymph node measuring 1.7 cm. He was found have a right upper lobe nodule measuring 3.2 x 2.6 cm. He had numerous pulmonary nodules bilaterally. The largest measured 14 x 13 mm in the left lower lobe. There is no chest wall or osseous lesions. His abdomen looked okay. He had no evidence of metastatic disease to his abdomen.  Currently he is on  ELIQUIS.  He was a heavy smoker. He probably has about a 70-pack-year history of tobacco use. He stopped in March.  He's had no hemoptysis. He's had no increased shortness of breath.  He just does not feel all that well.  Overall, his performance status is ECOG 1 at best.    Past Medical History  Diagnosis Date  . Hypertension   . Prostate enlargement   . Peripheral vascular disease ([place]) 12/2015    non-healing ulcer right Hallux. S/P angioplasty and stenting of right common iliac 01/02/2016.  . Right foot pain 12/30/2015    Admitted to [facility] 12/11/2015, discharge 12/14/2015, diagnoses included acute right great toe infection/cellulitis, peripheral vascular disease. ABI demonstrated arterial occlusive disease worse on left. Is referred outpatient to vascular The CT scan documents for copy of his discharge summary. He should follow up with Dr. Ladona Horns, general surgery at [facility]. No dated 02/06/2  . Anemia 12/2015  . Colon polyps ~ 2001    unknown pathology, colonoscopy done at outside hospital.   . Heme positive stool 12/2015  . Gastrointestinal hemorrhage with melena     Status post EGD and blood transfusion March 2017   . Ischemic foot ulcer due to atherosclerosis of native artery of limb ([place]) 01/10/2016  . Cancer [facility])     lung  :  Past Surgical History  Procedure Laterality Date  . Hydrocele excision  1956  . Peripheral  vascular catheterization N/A 01/02/2016    Procedure: Abdominal Aortogram w/Lower Extremity;  Surgeon: Angelia Mould, MD;  Location: [place] [facility];  Service: Cardiovascular;  Laterality: N/A;  . Peripheral vascular catheterization  01/02/2016    Procedure: Peripheral Vascular Intervention;  Surgeon: Angelia Mould, MD;  Location: [place] [facility];  Service: Cardiovascular;;  . Esophagogastroduodenoscopy N/A 01/21/2016    Procedure: ESOPHAGOGASTRODUODENOSCOPY (EGD);  Surgeon: Irene Shipper, MD;  Location: [facility];   Service: Endoscopy;  Laterality: N/A;  :   Current outpatient prescriptions:  .  apixaban (ELIQUIS) 5 MG TABS tablet, Start taking 2 tablet (10 mg total) by mouth twice per day for severn days, then take 1 tablet (5 mg total) by mouth twice per day, Disp: 60 tablet, Rfl: 0 .  finasteride (PROSCAR) 5 MG tablet, Take 5 mg by mouth daily. Reported on 02/01/2016, Disp: , Rfl:  .  pantoprazole (PROTONIX) 40 MG tablet, , Disp: , Rfl:  .  terazosin (HYTRIN) 10 MG capsule, Take 10 mg by mouth at bedtime. Reported on 01/20/2016, Disp: , Rfl: :  :  No Known Allergies:  Family History  Problem Relation Age of Onset  . Colon cancer Neg Hx   . Diabetes Brother   :  Social History   Social History  . Marital Status: Married    Spouse Name: Gwyndolyn Saxon  . Number of Children: 2  . Years of Education: N/A   Occupational History  . retired    Social History Main Topics  . Smoking status: Former Smoker -- 0.25 packs/day    Types: Cigarettes    Quit date: 01/18/2016  . Smokeless tobacco: Never Used  . Alcohol Use: 6.0 oz/week    0 Standard drinks or equivalent, 10 Cans of beer per week     Comment: beer  . Drug Use: No  . Sexual Activity: Not on file   Other Topics Concern  . Not on file   Social History Narrative  :  Pertinent items are noted in HPI.  Exam: '@IPVITALS' @ Elderly-appearing white male in no obvious distress. Vital signs show a temperature of 98. Pulse 67. Blood pressure 128/59. Weight is 156 pounds. Head neck exam shows no ocular or oral lesions. He has no palpable cervical or supraclavicular lymph nodes. Lungs are clear. He has no rales, wheezes or rhonchi. Cardiac exam regular rate and rhythm with no murmurs, rubs or bruits. Abdomen is soft. He has good bowel sounds. There is no fluid wave. There is no palpable liver or spleen tip. Back exam shows no tenderness over the spine, ribs or hips. Extremities shows 2+ edema in his lower legs. He has decent pulses in his feet.  Neurological exam shows no focal neurological deficits. Skin exam shows some scattered ecchymoses.    Recent Labs  05/08/16 1154  WBC 12.8*  HGB 9.3*  HCT 28.8*  PLT 251    Recent Labs  05/08/16 1154 05/10/16 1301  NA 142 143  K 4.3 4.3  CL 110  --   CO2 18* 19*  GLUCOSE 87 107  BUN 25 26.4*  CREATININE 1.32* 1.3  CALCIUM 8.3* 8.5    Blood smear review:  None  Pathology: None     Assessment and Plan:  Henry Ford is a very nice 81 year old white male. He has a really tough problem however. He clearly has bronchogenic carcinoma. However, this needs to be biopsied. I would think that this is probably a non-small cell lung cancer. He has  a heavy history of back or use. The appearance on the CT scan looks like a non-small cell lung cancer. I suppose that this could be a small cell lung cancer.  I spoke with Dr. Lamonte Sakai of pulmonary medicine. I think that Henry Ford needs a bronchoscopy and biopsy. I realize that he is on anticoagulation. I think that we probably stop anticoagulation safely for a day so that a biopsy can be done. As always, we had sent off the required genetic markers. He will also need to have the PD-L1 as a sent off.  I also like to get a PET scan on him. This will give me a good idea as to how extensive his disease is.  By his overall performance status, I would have to think that his prealbumin is going be on the low side.  Nutritional support will be very key. I told him to try to eat 5-6 small meals a day. I think that  protein drinks would be helpful.  I also will get an MRI of his brain. I don't think he has brain metastases but this is something  that is a possibility given the extent of his intrathoracic disease.  I think the prognosis here will be dictated by his weight. I think that his weight continues to go down, this will be an indicator that his disease is quite active.  Both he and his wife are very nice. I feel bad for them.  Once we get all  the results back from radiology and his biopsies, then we can plan to get him back and we can decide as to how best to proceed.  I spent about an hour with he and his wife. It was nice talk to him about [place]. I told him to try to wear a [employer] baseball cap!!!

## 2016-05-11 ENCOUNTER — Ambulatory Visit (INDEPENDENT_AMBULATORY_CARE_PROVIDER_SITE_OTHER): Payer: Commercial Managed Care - HMO | Admitting: Emergency Medicine

## 2016-05-11 ENCOUNTER — Encounter: Payer: Self-pay | Admitting: Emergency Medicine

## 2016-05-11 VITALS — BP 122/76 | HR 65 | Ht 68.0 in | Wt 156.2 lb

## 2016-05-11 DIAGNOSIS — R918 Other nonspecific abnormal finding of lung field: Secondary | ICD-10-CM

## 2016-05-11 LAB — PREALBUMIN: PREALBUMIN: 15 mg/dL (ref 9–32)

## 2016-05-11 NOTE — Patient Instructions (Signed)
We will admit you to the hospital on Tuesday 05/15/16 in preparation for a bronchoscopy to be done on Wednesday 6/28. Do not take your Eliquis on 05/15/16 Continue other medications as you have been taking them

## 2016-05-11 NOTE — Progress Notes (Signed)
Subjective:     Patient ID: Henry Ford, male   DOB: 03-Nov-1935, 82 y.o.   MRN: [id number]  HPI 81 yo man, hx tobacco, HTN, arterial PVD with R LE stenting. He was experiencing unexplained weight loss, dyspnea. He underwent dx with a LLE DVT recently, also had CT chest 6/19 that showed B nodules, a predominant one in the RUL, as well as subacute LLL PE. Also had significant mediastinal LAD vs masses present. He started Eliquis on 05/09/16. His plavix was stopped on 6/20. He is referred by Dr Katheran Awe to discuss bronchoscopy for tissue dx.   Review of Systems  Constitutional: Negative for fever and unexpected weight change.  HENT: Negative for congestion, dental problem, ear pain, nosebleeds, postnasal drip, rhinorrhea, sinus pressure, sneezing, sore throat and trouble swallowing.   Eyes: Negative for redness and itching.  Respiratory: Positive for shortness of breath. Negative for cough, chest tightness and wheezing.   Cardiovascular: Negative for palpitations and leg swelling.  Gastrointestinal: Negative for nausea and vomiting.  Genitourinary: Negative for dysuria.  Musculoskeletal: Negative for joint swelling.  Skin: Negative for rash.  Neurological: Negative for headaches.  Hematological: Does not bruise/bleed easily.  Psychiatric/Behavioral: Negative for dysphoric mood. The patient is not nervous/anxious.     Past Medical History  Diagnosis Date  . Hypertension   . Prostate enlargement   . Peripheral vascular disease ([place]) 12/2015    non-healing ulcer right Hallux. S/P angioplasty and stenting of right common iliac 01/02/2016.  . Right foot pain 12/30/2015    Admitted to [facility] 12/11/2015, discharge 12/14/2015, diagnoses included acute right great toe infection/cellulitis, peripheral vascular disease. ABI demonstrated arterial occlusive disease worse on left. Is referred outpatient to vascular The CT scan documents for copy of his discharge summary. He should follow up with Dr.  Ladona Horns, general surgery at [facility]. No dated 02/06/2  . Anemia 12/2015  . Colon polyps ~ 2001    unknown pathology, colonoscopy done at outside hospital.   . Heme positive stool 12/2015  . Gastrointestinal hemorrhage with melena     Status post EGD and blood transfusion March 2017   . Ischemic foot ulcer due to atherosclerosis of native artery of limb ([place]) 01/10/2016  . Cancer ([place])     lung     Family History  Problem Relation Age of Onset  . Colon cancer Neg Hx   . Diabetes Brother      Social History   Social History  . Marital Status: Married    Spouse Name: Gwyndolyn Saxon  . Number of Children: 2  . Years of Education: N/A   Occupational History  . retired    Social History Main Topics  . Smoking status: Former Smoker -- 0.25 packs/day for 60 years    Types: Cigarettes    Start date: 07/03/1950    Quit date: 01/20/2016  . Smokeless tobacco: Never Used  . Alcohol Use: 6.0 oz/week    10 Cans of beer, 0 Standard drinks or equivalent per week     Comment: beer  . Drug Use: No  . Sexual Activity: Not on file   Other Topics Concern  . Not on file   Social History Narrative  he was in the [employer], was a Haematologist   No Known Allergies   No facility-administered medications prior to visit.   Outpatient Prescriptions Prior to Visit  Medication Sig Dispense Refill  . apixaban (ELIQUIS) 5 MG TABS tablet Start taking 2 tablet (10 mg total) by mouth  twice per day for severn days, then take 1 tablet (5 mg total) by mouth twice per day 60 tablet 0  . finasteride (PROSCAR) 5 MG tablet Take 5 mg by mouth daily. Reported on 02/01/2016    . pantoprazole (PROTONIX) 40 MG tablet Take 40 mg by mouth daily.     Marland Kitchen terazosin (HYTRIN) 10 MG capsule Take 10 mg by mouth at bedtime. Reported on 01/20/2016           Objective:   Physical Exam Filed Vitals:   05/11/16 1348  BP: 122/76  Pulse: 65  Height: '5\' 8"'$  (1.727 m)  Weight: 156 lb 3.2 oz (70.852 kg)  SpO2: 99%   Gen: Pleasant, Thin, weak, in no distress,  normal affect  ENT: No lesions,  mouth clear,  oropharynx clear, no postnasal drip  Neck: No JVD, no TMG, no carotid bruits  Lungs: No use of accessory muscles, distant breath sounds bilaterally, clear without rales or rhonchi  Cardiovascular: RRR, heart sounds normal, no murmur or gallops, trace lower extremity peripheral edema  Musculoskeletal: No deformities, no cyanosis or clubbing  Neuro: alert, non focal  Skin: Warm, no lesions or rashes  05/07/16 --  COMPARISON: Chest radiograph 05/03/2016.  FINDINGS: CT CHEST  Cardiovascular: The heart size is normal. There is no pericardial effusion. There is atherosclerosis of the aorta, great vessels and coronary arteries. Although not performed as a CTA, there is evidence of acute/subacute pulmonary embolism within the left lower lobe pulmonary artery.  Mediastinum/Nodes: There are multiple enlarged mediastinal and right hilar lymph nodes. There is a dominant precarinal nodal mass measuring 3.9 x 2.4 cm on image 28. There is a right hilar node measuring up to 1.7 cm on image 32. The thyroid gland, trachea and esophagus demonstrate no significant findings.  Lungs/Pleura: There is no pleural effusion. This dominant spiculated right upper lobe lesion measuring 3.2 x 2.6 cm on image 59, likely a primary lung cancer. There are numerous other pulmonary nodules bilaterally suspicious for metastatic disease. The largest of these other nodules measures 14 x 13 mm in the left lower lobe on image 121. There are multiple other smaller lesions, more than 10 in each lung.  Musculoskeletal/Chest wall: No chest wall mass or suspicious osseous findings. There is a mild superior endplate compression deformity at T8 which appears benign.  CT ABDOMEN AND PELVIS FINDINGS  Hepatobiliary: The liver demonstrates mild contour irregularity, but no focal abnormality. No evidence of gallstones,  gallbladder wall thickening or biliary dilatation.  Pancreas: Unremarkable. No pancreatic ductal dilatation or surrounding inflammatory changes.  Spleen: Normal in size without focal abnormality.  Adrenals/Urinary Tract: 15 mm left adrenal nodule measures 5 HU on the delayed images, likely an incidental adenoma. The right adrenal gland appears normal. 5.1 cm lesion projecting from the lower pole of the left kidney measures slightly higher than water density (21 -23 HU), although is probably a cyst. There are other smaller low-density renal lesions which are probably cysts as well. No definite enhancing renal mass or hydronephrosis. There is no evidence of ureteral calculus. The bladder is heavily trabeculated. There is a calculus within a posterior bladder diverticulum.  Stomach/Bowel: No evidence of bowel wall thickening, distention or surrounding inflammatory change. The stomach is decompressed.  Vascular/Lymphatic: There are no enlarged abdominal or pelvic lymph nodes. There is extensive aortic and branch vessel atherosclerosis. There is nearly occlusive thrombus within the left femoral vein. This extends into the left external iliac vein. There is also nonocclusive thrombus in  the right femoral vein. The IVC appears patent.  Reproductive: There are extensive calcifications throughout the prostate gland.  Other: No ascites or peritoneal nodularity.  Musculoskeletal: No acute or significant osseous findings. There is a superior endplate compression deformity at L1 with an associated Schmorl's node which appears benign. Degenerative changes are present in the lower lumbar spine.  IMPRESSION: 1. Findings are consistent with right upper lobe lung cancer with multiple enlarged mediastinal and right hilar lymph nodes as well as numerous bilateral pulmonary nodules consistent with metastases. 2. No definite evidence of metastatic disease within the abdomen  or pelvis. 3. Bilateral DVT with thromboembolic disease (pulmonary embolism) within the left pulmonary artery. 4. Other incidental findings include the presence of extensive atherosclerosis, trabeculated bladder containing a calculus, probable incidental left adrenal adenoma, bilateral renal cysts, and chronic appearing thoracolumbar compression deformities.      Assessment:     Newly recognized mediastinal masses/lymphadenopathy, likely malignancy of lung primary although metastatic disease or lymphoma is possible. Likely explains his weight loss and malaise as well as his hypercoagulable state and pulmonary embolism.    Plan:     Lung mass Believe that he is lesions will be best approached by endobronchial ultrasound and navigational bronchoscopy to the right upper lobe. I'll try to arrange this for next week. He will need to be admitted to the hospital for heparin bridging so that his Eliquis can be stopped prior to biopsy.    Baltazar Apo, MD, PhD 05/15/2016, 1:33 PM [facility] [phone] or if no answer [phone]

## 2016-05-13 ENCOUNTER — Other Ambulatory Visit: Payer: Self-pay

## 2016-05-13 ENCOUNTER — Emergency Department (HOSPITAL_COMMUNITY): Payer: Commercial Managed Care - HMO

## 2016-05-13 ENCOUNTER — Encounter (HOSPITAL_COMMUNITY): Payer: Self-pay | Admitting: Emergency Medicine

## 2016-05-13 ENCOUNTER — Inpatient Hospital Stay (HOSPITAL_COMMUNITY)
Admission: EM | Admit: 2016-05-13 | Discharge: 2016-05-16 | DRG: 064 | Disposition: A | Discharge status: Home Health | Payer: Commercial Managed Care - HMO | Attending: Internal Medicine | Admitting: Internal Medicine

## 2016-05-13 DIAGNOSIS — R609 Edema, unspecified: Secondary | ICD-10-CM

## 2016-05-13 DIAGNOSIS — I63531 Cerebral infarction due to unspecified occlusion or stenosis of right posterior cerebral artery: Secondary | ICD-10-CM | POA: Diagnosis present

## 2016-05-13 DIAGNOSIS — I129 Hypertensive chronic kidney disease with stage 1 through stage 4 chronic kidney disease, or unspecified chronic kidney disease: Secondary | ICD-10-CM | POA: Diagnosis present

## 2016-05-13 DIAGNOSIS — E440 Moderate protein-calorie malnutrition: Secondary | ICD-10-CM | POA: Diagnosis present

## 2016-05-13 DIAGNOSIS — I2699 Other pulmonary embolism without acute cor pulmonale: Secondary | ICD-10-CM

## 2016-05-13 DIAGNOSIS — D6859 Other primary thrombophilia: Secondary | ICD-10-CM | POA: Diagnosis present

## 2016-05-13 DIAGNOSIS — Z86711 Personal history of pulmonary embolism: Secondary | ICD-10-CM

## 2016-05-13 DIAGNOSIS — D6489 Other specified anemias: Secondary | ICD-10-CM | POA: Diagnosis present

## 2016-05-13 DIAGNOSIS — Z6823 Body mass index (BMI) 23.0-23.9, adult: Secondary | ICD-10-CM

## 2016-05-13 DIAGNOSIS — Z7901 Long term (current) use of anticoagulants: Secondary | ICD-10-CM

## 2016-05-13 DIAGNOSIS — C3411 Malignant neoplasm of upper lobe, right bronchus or lung: Secondary | ICD-10-CM | POA: Diagnosis present

## 2016-05-13 DIAGNOSIS — R590 Localized enlarged lymph nodes: Secondary | ICD-10-CM | POA: Diagnosis present

## 2016-05-13 DIAGNOSIS — G9341 Metabolic encephalopathy: Secondary | ICD-10-CM | POA: Diagnosis present

## 2016-05-13 DIAGNOSIS — Z66 Do not resuscitate: Secondary | ICD-10-CM | POA: Diagnosis not present

## 2016-05-13 DIAGNOSIS — E46 Unspecified protein-calorie malnutrition: Secondary | ICD-10-CM | POA: Diagnosis present

## 2016-05-13 DIAGNOSIS — H53462 Homonymous bilateral field defects, left side: Secondary | ICD-10-CM | POA: Diagnosis present

## 2016-05-13 DIAGNOSIS — I639 Cerebral infarction, unspecified: Secondary | ICD-10-CM | POA: Diagnosis present

## 2016-05-13 DIAGNOSIS — I739 Peripheral vascular disease, unspecified: Secondary | ICD-10-CM | POA: Diagnosis present

## 2016-05-13 DIAGNOSIS — N400 Enlarged prostate without lower urinary tract symptoms: Secondary | ICD-10-CM | POA: Diagnosis present

## 2016-05-13 DIAGNOSIS — Z86010 Personal history of colonic polyps: Secondary | ICD-10-CM

## 2016-05-13 DIAGNOSIS — R918 Other nonspecific abnormal finding of lung field: Secondary | ICD-10-CM | POA: Diagnosis present

## 2016-05-13 DIAGNOSIS — E785 Hyperlipidemia, unspecified: Secondary | ICD-10-CM | POA: Diagnosis present

## 2016-05-13 DIAGNOSIS — N183 Chronic kidney disease, stage 3 unspecified: Secondary | ICD-10-CM | POA: Diagnosis present

## 2016-05-13 DIAGNOSIS — N179 Acute kidney failure, unspecified: Secondary | ICD-10-CM | POA: Diagnosis present

## 2016-05-13 DIAGNOSIS — Z87891 Personal history of nicotine dependence: Secondary | ICD-10-CM

## 2016-05-13 DIAGNOSIS — Z79899 Other long term (current) drug therapy: Secondary | ICD-10-CM

## 2016-05-13 DIAGNOSIS — I82409 Acute embolism and thrombosis of unspecified deep veins of unspecified lower extremity: Secondary | ICD-10-CM | POA: Diagnosis present

## 2016-05-13 DIAGNOSIS — I82403 Acute embolism and thrombosis of unspecified deep veins of lower extremity, bilateral: Secondary | ICD-10-CM | POA: Diagnosis present

## 2016-05-13 DIAGNOSIS — I63431 Cerebral infarction due to embolism of right posterior cerebral artery: Principal | ICD-10-CM | POA: Diagnosis present

## 2016-05-13 LAB — PROTIME-INR
INR: 2.12 — AB (ref 0.00–1.49)
PROTHROMBIN TIME: 23.6 s — AB (ref 11.6–15.2)

## 2016-05-13 LAB — I-STAT CG4 LACTIC ACID, ED: Lactic Acid, Venous: 0.69 mmol/L (ref 0.5–2.0)

## 2016-05-13 LAB — CBC
HCT: 26.7 % — ABNORMAL LOW (ref 39.0–52.0)
Hemoglobin: 8.5 g/dL — ABNORMAL LOW (ref 13.0–17.0)
MCH: 28.3 pg (ref 26.0–34.0)
MCHC: 31.8 g/dL (ref 30.0–36.0)
MCV: 89 fL (ref 78.0–100.0)
PLATELETS: 283 10*3/uL (ref 150–400)
RBC: 3 MIL/uL — ABNORMAL LOW (ref 4.22–5.81)
RDW: 14.8 % (ref 11.5–15.5)
WBC: 13.2 10*3/uL — AB (ref 4.0–10.5)

## 2016-05-13 NOTE — ED Notes (Signed)
Per EMS:  Pt developed left eye blindness since Friday.  Since Saturday he has had intermittent headaches.  Then starting today pt's wife felt patient was confused, lethargic, and became concerned.  Pt has a new diagnosis of lung cancer (has not started treatment).  Pt endorses decreased appetite, and frequent urination.  Denies any pain.

## 2016-05-13 NOTE — ED Provider Notes (Signed)
CSN: [id number]     Arrival date & time 05/13/16  2152 History   First MD Initiated Contact with Patient 05/13/16 2201     Chief Complaint  Patient presents with  . Altered Mental Status  . Eye Problem     (Consider location/radiation/quality/duration/timing/severity/associated sxs/prior Treatment) HPI   81 year old male with history of lung cancer, hypertension, anemia brought here via EMS from home for evaluation of altered mental status. History obtained through patient and through wife who is at bedside. For the past several months patient has gone through quite a few medical complications. Initially he developed leg swelling and subsequently was found to have multiple blood clots in his leg. He had a stent placed in the leg to restore blood flow. He was placed on blood thinner medication but subsequently developing gastrointestinal hemorrhage with melena status post EGD and blood transfusion. He is currently on Eliquis.  This morning, patient developed acute vision loss in his left eye. His vision changes was intermittent but he was having difficulty ambulating and doing his puzzle which he does on a daily basis. His wife noticed that he was exhibiting confusion which is new and became concerned. Patient did fell today and hits his head but no loss of consciousness. He did report having intermittent headaches since Saturday which is new. No active headache at this time. No focal numbness or weakness.  Past Medical History  Diagnosis Date  . Hypertension   . Prostate enlargement   . Peripheral vascular disease ([place]) 12/2015    non-healing ulcer right Hallux. S/P angioplasty and stenting of right common iliac 01/02/2016.  . Right foot pain 12/30/2015    Admitted to [facility] 12/11/2015, discharge 12/14/2015, diagnoses included acute right great toe infection/cellulitis, peripheral vascular disease. ABI demonstrated arterial occlusive disease worse on left. Is referred outpatient to vascular The  CT scan documents for copy of his discharge summary. He should follow up with Dr. Ladona Horns, general surgery at [facility]. No dated 02/06/2  . Anemia 12/2015  . Colon polyps ~ 2001    unknown pathology, colonoscopy done at outside hospital.   . Heme positive stool 12/2015  . Gastrointestinal hemorrhage with melena     Status post EGD and blood transfusion March 2017   . Ischemic foot ulcer due to atherosclerosis of native artery of limb ([place]) 01/10/2016  . Cancer ([place])     lung   Past Surgical History  Procedure Laterality Date  . Hydrocele excision  1956  . Peripheral vascular catheterization N/A 01/02/2016    Procedure: Abdominal Aortogram w/Lower Extremity;  Surgeon: Angelia Mould, MD;  Location: [place] [facility];  Service: Cardiovascular;  Laterality: N/A;  . Peripheral vascular catheterization  01/02/2016    Procedure: Peripheral Vascular Intervention;  Surgeon: Angelia Mould, MD;  Location: [place] [facility];  Service: Cardiovascular;;  . Esophagogastroduodenoscopy N/A 01/21/2016    Procedure: ESOPHAGOGASTRODUODENOSCOPY (EGD);  Surgeon: Irene Shipper, MD;  Location: [facility];  Service: Endoscopy;  Laterality: N/A;   Family History  Problem Relation Age of Onset  . Colon cancer Neg Hx   . Diabetes Brother    Social History  Substance Use Topics  . Smoking status: Former Smoker -- 0.25 packs/day for 60 years    Types: Cigarettes    Start date: 07/03/1950    Quit date: 01/20/2016  . Smokeless tobacco: Never Used  . Alcohol Use: 6.0 oz/week    10 Cans of beer, 0 Standard drinks or equivalent per week  Comment: beer    Review of Systems  All other systems reviewed and are negative.     Allergies  Review of patient's allergies indicates no known allergies.  Home Medications   Prior to Admission medications   Medication Sig Start Date End Date Taking? Authorizing Provider  apixaban (ELIQUIS) 5 MG TABS tablet Start taking 2 tablet (10  mg total) by mouth twice per day for severn days, then take 1 tablet (5 mg total) by mouth twice per day 05/07/16   [name], DO  co-enzyme Q-10 30 MG capsule Take 30 mg by mouth daily.    Historical Provider, MD  ferrous sulfate 325 (65 FE) MG tablet Take 325 mg by mouth 2 (two) times daily with a meal.    Historical Provider, MD  finasteride (PROSCAR) 5 MG tablet Take 5 mg by mouth daily. Reported on 02/01/2016    Historical Provider, MD  Multiple Vitamin (MULTIVITAMIN) tablet Take 1 tablet by mouth daily.    Historical Provider, MD  Omega 3 1000 MG CAPS Take 1,000 mg by mouth daily.    Historical Provider, MD  pantoprazole (PROTONIX) 40 MG tablet Take 40 mg by mouth daily.  04/18/16   Historical Provider, MD  Red Yeast Rice 600 MG CAPS Take 600 mg by mouth daily.    Historical Provider, MD  terazosin (HYTRIN) 10 MG capsule Take 10 mg by mouth at bedtime. Reported on 01/20/2016    Historical Provider, MD  vitamin B-12 (CYANOCOBALAMIN) 1000 MCG tablet Take 1,000 mcg by mouth daily.    Historical Provider, MD   There were no vitals taken for this visit. Physical Exam  Constitutional: He is oriented to person, place, and time. He appears well-developed and well-nourished. No distress.  HENT:  Head: Atraumatic.  Right Ear: External ear normal.  Left Ear: External ear normal.  Mouth/Throat: Oropharynx is clear and moist.  Eyes: Conjunctivae are normal. Pupils are equal, round, and reactive to light. Right eye exhibits no discharge. Left eye exhibits no discharge.  Neck: Normal range of motion. Neck supple.  No carotid bruit. No nuchal rigidity  Cardiovascular: Normal rate and regular rhythm.   Pulmonary/Chest: Effort normal and breath sounds normal.  Abdominal: Soft. There is no tenderness.  Musculoskeletal: He exhibits edema (pitting edema 2+ BLE). He exhibits no tenderness.  5/5 strength to all 4 extremities  Neurological: He is alert and oriented to person, place, and time.   Neurologic exam:  Speech clear, pupils equal round reactive to light, extraocular movements is poor, unable to track finger Suspect L temporal hemianopsia Cranial nerves III through XII normal including no facial droop Follows commands, moves all extremities x4, normal strength to bilateral upper and lower extremities at all major muscle groups including grip Sensation normal to light touch and pinprick Coordination is poor, no limb ataxia, finger-nose-finger with dysmetria Rapid alternating movements normal No pronator drift Gait not tested   Skin: No rash noted.  Psychiatric: He has a normal mood and affect.  Nursing note and vitals reviewed.   ED Course  Procedures (including critical care time) Labs Review Labs Reviewed  COMPREHENSIVE METABOLIC PANEL - Abnormal; Notable for the following:    Chloride 112 (*)    CO2 20 (*)    BUN 21 (*)    Creatinine, Ser 1.43 (*)    Calcium 8.8 (*)    Total Protein 6.2 (*)    Albumin 2.8 (*)    ALT 10 (*)    GFR calc non Af  Amer 45 (*)    GFR calc Af Amer 53 (*)    All other components within normal limits  CBC - Abnormal; Notable for the following:    WBC 13.2 (*)    RBC 3.00 (*)    Hemoglobin 8.5 (*)    HCT 26.7 (*)    All other components within normal limits  SEDIMENTATION RATE - Abnormal; Notable for the following:    Sed Rate 50 (*)    All other components within normal limits  PROTIME-INR - Abnormal; Notable for the following:    Prothrombin Time 23.6 (*)    INR 2.12 (*)    All other components within normal limits  URINALYSIS, ROUTINE W REFLEX MICROSCOPIC (NOT AT [facility])  CBG MONITORING, ED  I-STAT CG4 LACTIC ACID, ED    Imaging Review Ct Head Wo Contrast  05/13/2016  CLINICAL DATA:  81 year old male with vision loss and altered mental status EXAM: CT HEAD WITHOUT CONTRAST TECHNIQUE: Contiguous axial images were obtained from the base of the skull through the vertex without intravenous contrast. COMPARISON:  None.  FINDINGS: There is no acute intracranial hemorrhage. There is mild to moderate age-related atrophy and chronic microvascular ischemic changes. There is a focal area of lower attenuation with loss of gray-white matter discrimination and effacement of the sulci in the right occipital lobe medially compatible with a right PCA territory infarct, likely acute or subacute. Clinical correlation is recommended. There is associated minimal mass effect on the adjacent brain parenchyma. No midline shift. There is mild mucoperiosteal thickening of paranasal sinuses with partial opacification of the ethmoid air cells. No air-fluid level. The mastoid air cells are clear. The calvarium is intact. IMPRESSION: No acute intracranial hemorrhage. Findings compatible with right PCA territory infarct, likely acute or subacute. Correlation with clinical exam recommended. MRI may provide better evaluation. These results were called by telephone at the time of interpretation on 05/13/2016 at 11:28 pm to Dr. Wyvonnia Dusky, who verbally acknowledged these results. Electronically Signed   By: Anner Crete M.D.   On: 05/13/2016 23:33   I have personally reviewed and evaluated these images and lab results as part of my medical decision-making.   EKG Interpretation None      MDM   Final diagnoses:  Acute ischemic stroke (HCC)    BP 140/64 mmHg  Pulse 65  Temp(Src) 98.2 F (36.8 C) (Oral)  Resp 12  SpO2 97%   11:10 PM Patient with history of newly diagnosed lung cancer, history of blood clots currently on adequate is here with confusion and left visual field cut concerning for left temporal hemianopsia. No other focal neuro deficit noted. Is able to answer questions appropriately. Will obtain head CT scan, workup initiated, likely consultation with neurologist for the care.  Care discussed with DR. Rancour.    11:34 PM CT scan of head demonstrated evidence of a right PCA ischemic stroke no evidence of hemorrhagic stroke.  This finding is consistent with patient's new onset of left temporal hemianopsia and confusion. I have consulted with neurologist Dr. Nicole Kindred who requested for pt to be admit by hospitalist, and he will be available to care for patient.    12:40 AM Appreciate consultation from [facility] Dr. Myna Hidalgo who agrees to admit pt to telemetry floor under his care.  Pt currently stable.  Pt and family members are aware of plan.    Domenic Moras, PA-C 05/14/16 [place], MD 05/14/16 [phone]

## 2016-05-13 NOTE — ED Notes (Signed)
Phlebotomy at bedside.  Unable to obtain blood from IV start site

## 2016-05-14 ENCOUNTER — Telehealth: Payer: Self-pay | Admitting: Emergency Medicine

## 2016-05-14 ENCOUNTER — Ambulatory Visit: Payer: Commercial Managed Care - HMO | Admitting: Hematology & Oncology

## 2016-05-14 ENCOUNTER — Other Ambulatory Visit (HOSPITAL_COMMUNITY): Payer: Commercial Managed Care - HMO

## 2016-05-14 ENCOUNTER — Inpatient Hospital Stay (HOSPITAL_COMMUNITY): Payer: Commercial Managed Care - HMO

## 2016-05-14 ENCOUNTER — Encounter (HOSPITAL_COMMUNITY): Payer: Self-pay | Admitting: Family Medicine

## 2016-05-14 DIAGNOSIS — I2699 Other pulmonary embolism without acute cor pulmonale: Secondary | ICD-10-CM

## 2016-05-14 DIAGNOSIS — I129 Hypertensive chronic kidney disease with stage 1 through stage 4 chronic kidney disease, or unspecified chronic kidney disease: Secondary | ICD-10-CM | POA: Diagnosis present

## 2016-05-14 DIAGNOSIS — I82409 Acute embolism and thrombosis of unspecified deep veins of unspecified lower extremity: Secondary | ICD-10-CM

## 2016-05-14 DIAGNOSIS — G9341 Metabolic encephalopathy: Secondary | ICD-10-CM | POA: Diagnosis present

## 2016-05-14 DIAGNOSIS — N400 Enlarged prostate without lower urinary tract symptoms: Secondary | ICD-10-CM | POA: Diagnosis present

## 2016-05-14 DIAGNOSIS — D6859 Other primary thrombophilia: Secondary | ICD-10-CM | POA: Diagnosis present

## 2016-05-14 DIAGNOSIS — I82403 Acute embolism and thrombosis of unspecified deep veins of lower extremity, bilateral: Secondary | ICD-10-CM | POA: Diagnosis not present

## 2016-05-14 DIAGNOSIS — Z7901 Long term (current) use of anticoagulants: Secondary | ICD-10-CM | POA: Diagnosis not present

## 2016-05-14 DIAGNOSIS — Z86010 Personal history of colonic polyps: Secondary | ICD-10-CM | POA: Diagnosis not present

## 2016-05-14 DIAGNOSIS — Z86711 Personal history of pulmonary embolism: Secondary | ICD-10-CM | POA: Diagnosis not present

## 2016-05-14 DIAGNOSIS — C3490 Malignant neoplasm of unspecified part of unspecified bronchus or lung: Secondary | ICD-10-CM

## 2016-05-14 DIAGNOSIS — I6789 Other cerebrovascular disease: Secondary | ICD-10-CM

## 2016-05-14 DIAGNOSIS — N183 Chronic kidney disease, stage 3 (moderate): Secondary | ICD-10-CM | POA: Diagnosis not present

## 2016-05-14 DIAGNOSIS — R918 Other nonspecific abnormal finding of lung field: Secondary | ICD-10-CM

## 2016-05-14 DIAGNOSIS — R609 Edema, unspecified: Secondary | ICD-10-CM | POA: Diagnosis not present

## 2016-05-14 DIAGNOSIS — I63431 Cerebral infarction due to embolism of right posterior cerebral artery: Secondary | ICD-10-CM | POA: Diagnosis present

## 2016-05-14 DIAGNOSIS — I63531 Cerebral infarction due to unspecified occlusion or stenosis of right posterior cerebral artery: Secondary | ICD-10-CM | POA: Diagnosis not present

## 2016-05-14 DIAGNOSIS — I2609 Other pulmonary embolism with acute cor pulmonale: Secondary | ICD-10-CM | POA: Diagnosis not present

## 2016-05-14 DIAGNOSIS — C3411 Malignant neoplasm of upper lobe, right bronchus or lung: Secondary | ICD-10-CM | POA: Diagnosis present

## 2016-05-14 DIAGNOSIS — Z87891 Personal history of nicotine dependence: Secondary | ICD-10-CM | POA: Diagnosis not present

## 2016-05-14 DIAGNOSIS — R590 Localized enlarged lymph nodes: Secondary | ICD-10-CM | POA: Diagnosis present

## 2016-05-14 DIAGNOSIS — I639 Cerebral infarction, unspecified: Secondary | ICD-10-CM | POA: Diagnosis present

## 2016-05-14 DIAGNOSIS — I82402 Acute embolism and thrombosis of unspecified deep veins of left lower extremity: Secondary | ICD-10-CM

## 2016-05-14 DIAGNOSIS — I739 Peripheral vascular disease, unspecified: Secondary | ICD-10-CM | POA: Diagnosis present

## 2016-05-14 DIAGNOSIS — E785 Hyperlipidemia, unspecified: Secondary | ICD-10-CM | POA: Diagnosis present

## 2016-05-14 DIAGNOSIS — E46 Unspecified protein-calorie malnutrition: Secondary | ICD-10-CM

## 2016-05-14 DIAGNOSIS — E440 Moderate protein-calorie malnutrition: Secondary | ICD-10-CM | POA: Diagnosis not present

## 2016-05-14 DIAGNOSIS — Z6823 Body mass index (BMI) 23.0-23.9, adult: Secondary | ICD-10-CM | POA: Diagnosis not present

## 2016-05-14 DIAGNOSIS — Z79899 Other long term (current) drug therapy: Secondary | ICD-10-CM | POA: Diagnosis not present

## 2016-05-14 DIAGNOSIS — I82401 Acute embolism and thrombosis of unspecified deep veins of right lower extremity: Secondary | ICD-10-CM | POA: Diagnosis not present

## 2016-05-14 DIAGNOSIS — I63331 Cerebral infarction due to thrombosis of right posterior cerebral artery: Secondary | ICD-10-CM | POA: Diagnosis not present

## 2016-05-14 DIAGNOSIS — D6489 Other specified anemias: Secondary | ICD-10-CM | POA: Diagnosis present

## 2016-05-14 DIAGNOSIS — H53462 Homonymous bilateral field defects, left side: Secondary | ICD-10-CM | POA: Diagnosis present

## 2016-05-14 DIAGNOSIS — Z66 Do not resuscitate: Secondary | ICD-10-CM | POA: Diagnosis not present

## 2016-05-14 LAB — URINE MICROSCOPIC-ADD ON

## 2016-05-14 LAB — COMPREHENSIVE METABOLIC PANEL
ALT: 10 U/L — ABNORMAL LOW (ref 17–63)
ANION GAP: 8 (ref 5–15)
AST: 18 U/L (ref 15–41)
Albumin: 2.8 g/dL — ABNORMAL LOW (ref 3.5–5.0)
Alkaline Phosphatase: 67 U/L (ref 38–126)
BUN: 21 mg/dL — ABNORMAL HIGH (ref 6–20)
CHLORIDE: 112 mmol/L — AB (ref 101–111)
CO2: 20 mmol/L — ABNORMAL LOW (ref 22–32)
CREATININE: 1.43 mg/dL — AB (ref 0.61–1.24)
Calcium: 8.8 mg/dL — ABNORMAL LOW (ref 8.9–10.3)
GFR, EST AFRICAN AMERICAN: 53 mL/min — AB (ref >60)
GFR, EST NON AFRICAN AMERICAN: 45 mL/min — AB (ref >60)
Glucose, Bld: 89 mg/dL (ref 65–99)
POTASSIUM: 4 mmol/L (ref 3.5–5.1)
Sodium: 140 mmol/L (ref 135–145)
Total Bilirubin: 0.5 mg/dL (ref 0.3–1.2)
Total Protein: 6.2 g/dL — ABNORMAL LOW (ref 6.5–8.1)

## 2016-05-14 LAB — ABO/RH: ABO/RH(D): O NEG

## 2016-05-14 LAB — LIPID PANEL
CHOLESTEROL: 152 mg/dL (ref 0–200)
HDL: 35 mg/dL — AB (ref >40)
LDL Cholesterol: 102 mg/dL — ABNORMAL HIGH (ref 0–99)
Total CHOL/HDL Ratio: 4.3 RATIO
Triglycerides: 77 mg/dL (ref <150)
VLDL: 15 mg/dL (ref 0–40)

## 2016-05-14 LAB — ECHOCARDIOGRAM COMPLETE
CHL CUP MV DEC (S): 232
EERAT: 12.04
EWDT: 232 ms
FS: 26 % — AB (ref 28–44)
IVS/LV PW RATIO, ED: 0.95
LA ID, A-P, ES: 43 mm
LA diam end sys: 43 mm
LA vol A4C: 85.8 ml
LA vol index: 47.9 mL/m2
LA vol: 88.2 mL
LADIAMINDEX: 2.34 cm/m2
LV TDI E'LATERAL: 9.14
LV TDI E'MEDIAL: 6.74
LV e' LATERAL: 9.14 cm/s
LVEEAVG: 12.04
LVEEMED: 12.04
LVOT area: 3.14 cm2
LVOTD: 20 mm
MV Peak grad: 5 mmHg
MV pk A vel: 97.3 m/s
MV pk E vel: 110 m/s
PW: 10 mm — AB (ref 0.6–1.1)
TAPSE: 25.3 mm

## 2016-05-14 LAB — URINALYSIS, ROUTINE W REFLEX MICROSCOPIC
Bilirubin Urine: NEGATIVE
Glucose, UA: NEGATIVE mg/dL
Ketones, ur: NEGATIVE mg/dL
NITRITE: POSITIVE — AB
PROTEIN: NEGATIVE mg/dL
Specific Gravity, Urine: 1.015 (ref 1.005–1.030)
pH: 5.5 (ref 5.0–8.0)

## 2016-05-14 LAB — TYPE AND SCREEN
ABO/RH(D): O NEG
ANTIBODY SCREEN: NEGATIVE

## 2016-05-14 LAB — CBG MONITORING, ED: GLUCOSE-CAPILLARY: 87 mg/dL (ref 65–99)

## 2016-05-14 LAB — SEDIMENTATION RATE: SED RATE: 50 mm/h — AB (ref 0–16)

## 2016-05-14 LAB — HEPARIN LEVEL (UNFRACTIONATED): Heparin Unfractionated: >2.2 IU/mL — ABNORMAL HIGH (ref 0.30–0.70)

## 2016-05-14 MED ORDER — OMEGA-3-ACID ETHYL ESTERS 1 G PO CAPS
1000.0000 mg | ORAL_CAPSULE | Freq: Every day | ORAL | Status: DC
Start: 2016-05-14 — End: 2016-05-16
  Administered 2016-05-14 – 2016-05-16 (×3): 1000 mg via ORAL
  Filled 2016-05-14 (×3): qty 1

## 2016-05-14 MED ORDER — ATORVASTATIN CALCIUM 10 MG PO TABS
10.0000 mg | ORAL_TABLET | Freq: Every day | ORAL | Status: DC
  Administered 2016-05-14 – 2016-05-15 (×2): 10 mg via ORAL
  Filled 2016-05-14 (×2): qty 1

## 2016-05-14 MED ORDER — APIXABAN 5 MG PO TABS: 5.0000 mg | ORAL_TABLET | Freq: Two times a day (BID) | ORAL | Status: DC

## 2016-05-14 MED ORDER — FINASTERIDE 5 MG PO TABS
5.0000 mg | ORAL_TABLET | Freq: Every day | ORAL | Status: DC
  Administered 2016-05-14: 5 mg via ORAL
  Filled 2016-05-14: qty 1

## 2016-05-14 MED ORDER — STROKE: EARLY STAGES OF RECOVERY BOOK
Freq: Once | Status: AC
  Administered 2016-05-14: 06:00:00
  Filled 2016-05-14: qty 1

## 2016-05-14 MED ORDER — SODIUM CHLORIDE 0.9 % IV SOLN
INTRAVENOUS | Status: AC
  Administered 2016-05-14: 06:00:00 via INTRAVENOUS

## 2016-05-14 MED ORDER — ENSURE ENLIVE PO LIQD
237.0000 mL | Freq: Two times a day (BID) | ORAL | Status: DC
  Administered 2016-05-15 – 2016-05-16 (×2): 237 mL via ORAL
  Filled 2016-05-14 (×7): qty 237

## 2016-05-14 MED ORDER — SENNOSIDES-DOCUSATE SODIUM 8.6-50 MG PO TABS: 1.0000 | ORAL_TABLET | Freq: Every evening | ORAL | Status: DC | PRN

## 2016-05-14 MED ORDER — APIXABAN 5 MG PO TABS
10.0000 mg | ORAL_TABLET | Freq: Two times a day (BID) | ORAL | Status: DC
  Administered 2016-05-14: 10 mg via ORAL
  Filled 2016-05-14: qty 2

## 2016-05-14 MED ORDER — HEPARIN (PORCINE) IN NACL 100-0.45 UNIT/ML-% IJ SOLN
900.0000 [IU]/h | INTRAMUSCULAR | Status: DC
  Administered 2016-05-14: 1000 [IU]/h via INTRAVENOUS
  Filled 2016-05-14 (×3): qty 250

## 2016-05-14 MED ORDER — TERAZOSIN HCL 5 MG PO CAPS
10.0000 mg | ORAL_CAPSULE | Freq: Every day | ORAL | Status: DC
Start: 2016-05-14 — End: 2016-05-16
  Administered 2016-05-14 – 2016-05-15 (×2): 10 mg via ORAL
  Filled 2016-05-14 (×4): qty 2

## 2016-05-14 MED ORDER — GADOBENATE DIMEGLUMINE 529 MG/ML IV SOLN
15.0000 mL | Freq: Once | INTRAVENOUS | Status: AC | PRN
  Administered 2016-05-14: 15 mL via INTRAVENOUS

## 2016-05-14 MED ORDER — ADULT MULTIVITAMIN W/MINERALS CH
1.0000 | ORAL_TABLET | Freq: Every day | ORAL | Status: DC
  Administered 2016-05-14 – 2016-05-16 (×3): 1 via ORAL
  Filled 2016-05-14 (×3): qty 1

## 2016-05-14 MED ORDER — PANTOPRAZOLE SODIUM 40 MG PO TBEC
40.0000 mg | DELAYED_RELEASE_TABLET | Freq: Every day | ORAL | Status: DC
  Administered 2016-05-14 – 2016-05-16 (×3): 40 mg via ORAL
  Filled 2016-05-14 (×3): qty 1

## 2016-05-14 MED ORDER — FERROUS SULFATE 325 (65 FE) MG PO TABS
325.0000 mg | ORAL_TABLET | Freq: Two times a day (BID) | ORAL | Status: DC
  Administered 2016-05-14 (×2): 325 mg via ORAL
  Filled 2016-05-14 (×2): qty 1

## 2016-05-14 MED ORDER — FINASTERIDE 5 MG PO TABS
5.0000 mg | ORAL_TABLET | Freq: Every day | ORAL | Status: DC
  Administered 2016-05-15: 5 mg via ORAL
  Filled 2016-05-14: qty 1

## 2016-05-14 MED ORDER — VITAMIN B-12 1000 MCG PO TABS
1000.0000 ug | ORAL_TABLET | Freq: Every day | ORAL | Status: DC
  Administered 2016-05-14 – 2016-05-16 (×3): 1000 ug via ORAL
  Filled 2016-05-14 (×3): qty 1

## 2016-05-14 NOTE — Consult Note (Signed)
PULMONARY / CRITICAL CARE MEDICINE   Name: Henry Ford MRN: [id number] DOB: Aug 08, 1935    ADMISSION DATE:  05/13/2016 CONSULTATION DATE:  05/14/16   REFERRING MD:  Dr Shanon Brow Tat  CHIEF COMPLAINT:     HISTORY OF PRESENT ILLNESS:  History is gained from talking to the patient, his wife and review of the chart and talking to Dr. Baltazar Apo and reviewing his notes  81 year male with a history of smoking in the past. He's had unexplained weight loss. Investigations on 05/07/2016 CT revealed mediastinal nodes and metastatic highly suspicious lung cancer in the chest without evidence of spread to the abdomen. Simultaneous diagnosis of pulmonary embolism on the CT chest in the left pulmonary artery and bilateral deep vein thrombosis. His Plavix was stopped on 05/08/2016. Then on 05/09/2016 he was started on eliquis. However he was admitted on 05/13/2016 following a day of confusion and diagnosed to have right posterior cerebral artery infarct. Then on 05/14/2016 pulmonary was consulted because he's previously scheduled for general anesthesia related endobronchial ultrasound at [facility] navigational bronchoscopy procedure for on Wednesday, 05/16/2016. He would need to come off his apixaban for this. At this point in time he feels well and denies any complaints and wants to go home. His wife also thinks he has improved.  Of note I personally visualized the CT chest from 05/07/2016  PAST MEDICAL HISTORY :  He  has a past medical history of Hypertension; Prostate enlargement; Peripheral vascular disease ([place]) (12/2015); Right foot pain (12/30/2015); Anemia (12/2015); Colon polyps (~ 2001); Heme positive stool (12/2015); Gastrointestinal hemorrhage with melena; Ischemic foot ulcer due to atherosclerosis of native artery of limb ([place]) (01/10/2016); and Cancer ([place]).  PAST SURGICAL HISTORY: He  has past surgical history that includes Hydrocele surgery (1956); Cardiac catheterization (N/A, 01/02/2016); Cardiac  catheterization (01/02/2016); and Esophagogastroduodenoscopy (N/A, 01/21/2016).  No Known Allergies  No current facility-administered medications on file prior to encounter.   Current Outpatient Prescriptions on File Prior to Encounter  Medication Sig  . apixaban (ELIQUIS) 5 MG TABS tablet Start taking 2 tablet (10 mg total) by mouth twice per day for severn days, then take 1 tablet (5 mg total) by mouth twice per day  . co-enzyme Q-10 30 MG capsule Take 30 mg by mouth daily.  . ferrous sulfate 325 (65 FE) MG tablet Take 325 mg by mouth 2 (two) times daily with a meal.  . finasteride (PROSCAR) 5 MG tablet Take 5 mg by mouth daily. Reported on 02/01/2016  . Multiple Vitamin (MULTIVITAMIN) tablet Take 1 tablet by mouth daily.  . Omega 3 1000 MG CAPS Take 1,000 mg by mouth daily.  . pantoprazole (PROTONIX) 40 MG tablet Take 40 mg by mouth daily.   . Red Yeast Rice 600 MG CAPS Take 600 mg by mouth daily.  Marland Kitchen terazosin (HYTRIN) 10 MG capsule Take 10 mg by mouth at bedtime. Reported on 01/20/2016  . vitamin B-12 (CYANOCOBALAMIN) 1000 MCG tablet Take 1,000 mcg by mouth daily.    FAMILY HISTORY:  His indicated that his brother is deceased.   SOCIAL HISTORY: He  reports that he quit smoking about 3 months ago. His smoking use included Cigarettes. He started smoking about 65 years ago. He has a 15 pack-year smoking history. He has never used smokeless tobacco. He reports that he drinks about 6.0 oz of alcohol per week. He reports that he does not use illicit drugs.  REVIEW OF SYSTEMS:   Not fully elicitable because both of them are  poor historians. 11 point review of system is as per history of present illness otherwise negative or unknown elicitable  VITAL SIGNS: BP 141/60 mmHg  Pulse 68  Temp(Src) 98.8 F (37.1 C) (Oral)  Resp 16  SpO2 98%  HEMODYNAMICS:    VENTILATOR SETTINGS:    INTAKE / OUTPUT: I/O last 3 completed shifts: In: -  Out: 400 [Urine:400]  PHYSICAL  EXAMINATION: General:  Frail male lying in bed Neuro:  Poor historian. Oriented 2. Moves all 4 extremities. Good muscular strength supple neck no neck nodes HEENT:  Supple neck good tone. No neck nodes Cardiovascular:  Normal heart sounds. Regular rate and rhythm no murmurs. Lungs:  Clear to auscultation bilaterally. No distress Abdomen:  Soft nontender no organomegaly Musculoskeletal:  No cyanosis no clubbing no edema. Right lower extremity is warm Skin:  Appears intact in the exposed areas  LABS: PULMONARY No results for input(s): PHART, PCO2ART, PO2ART, HCO3, TCO2, O2SAT in the last 168 hours.  Invalid input(s): PCO2, PO2  CBC  Recent Labs Lab 05/08/16 1154 05/13/16 2246  HGB 9.3* 8.5*  HCT 28.8* 26.7*  WBC 12.8* 13.2*  PLT 251 283    COAGULATION  Recent Labs Lab 05/13/16 2246  INR 2.12*    CARDIAC  No results for input(s): TROPONINI in the last 168 hours. No results for input(s): PROBNP in the last 168 hours.   CHEMISTRY  Recent Labs Lab 05/08/16 1154 05/10/16 1301 05/13/16 2246  NA 142 143 140  K 4.3 4.3 4.0  CL 110  --  112*  CO2 18* 19* 20*  GLUCOSE 87 107 89  BUN 25 26.4* 21*  CREATININE 1.32* 1.3 1.43*  CALCIUM 8.3* 8.5 8.8*   Estimated Creatinine Clearance: 41.2 mL/min (by C-G formula based on Cr of 1.43).   LIVER  Recent Labs Lab 05/08/16 1154 05/10/16 1301 05/13/16 2246  AST '14 22 18  '$ ALT 9 12 10*  ALKPHOS 70 79 67  BILITOT 0.3 0.40 0.5  PROT 6.7 6.8 6.2*  ALBUMIN 3.4* 2.7* 2.8*  INR  --   --  2.12*     INFECTIOUS  Recent Labs Lab 05/13/16 2254  LATICACIDVEN 0.69     ENDOCRINE CBG (last 3)   Recent Labs  05/14/16 0031  GLUCAP 87         IMAGING x48h  - image(s) personally visualized  -   highlighted in bold Ct Head Wo Contrast  05/13/2016  CLINICAL DATA:  81 year old male with vision loss and altered mental status EXAM: CT HEAD WITHOUT CONTRAST TECHNIQUE: Contiguous axial images were obtained from the  base of the skull through the vertex without intravenous contrast. COMPARISON:  None. FINDINGS: There is no acute intracranial hemorrhage. There is mild to moderate age-related atrophy and chronic microvascular ischemic changes. There is a focal area of lower attenuation with loss of gray-white matter discrimination and effacement of the sulci in the right occipital lobe medially compatible with a right PCA territory infarct, likely acute or subacute. Clinical correlation is recommended. There is associated minimal mass effect on the adjacent brain parenchyma. No midline shift. There is mild mucoperiosteal thickening of paranasal sinuses with partial opacification of the ethmoid air cells. No air-fluid level. The mastoid air cells are clear. The calvarium is intact. IMPRESSION: No acute intracranial hemorrhage. Findings compatible with right PCA territory infarct, likely acute or subacute. Correlation with clinical exam recommended. MRI may provide better evaluation. These results were called by telephone at the time of interpretation on 05/13/2016 at  11:28 pm to Dr. Wyvonnia Dusky, who verbally acknowledged these results. Electronically Signed   By: Anner Crete M.D.   On: 05/13/2016 23:33        ASSESSMENT / PLAN:  PULMONARY A:  Previous heavy smoker. Now with mediastinal adenopathy and lung mass diagnosed on CT 05/07/2016 Incidental diagnosis of pulmonary embolism and bilateral deep vein thrombosis 05/07/2016 Started on Apixaban (dc plavix) 05/08/2016 New admission for right posterior cerebral artery infarct 05/13/2016 Neurologically intact 05/14/2016 On schedule for bronchoscopy under anesthesia for 05/16/2016  P:   Discussed with neurology - we will need to ascertain significance stenosis are not of the posterior cerebral artery. Therefore we will continue anticoagulation at this point in time. Also according to neurology Dr. Remonia Richter the size of the stroke on the CT seems significant an MRI  will do better delineation of this. In addition MRI will let us know if there are any cerebral metastases. All this information will be helpful in deciding about safety of bronchoscopy for 05/16/2016.  Therefore at this point in time we will continue anticoagulation and make a decision about safety for bronchoscopy after the MRI is done.  Hopefully we can make disposition in the morning of 05/15/2016.    FAMILY  - Updates: Wife updated at the bedside    Dr. Brand Males, M.D., [facility].C.P Pulmonary and Critical Care Medicine Staff Physician [place] [facility] [facility] Pager: [phone], If no answer or between  15:00h - 7:00h: call [phone]  05/14/2016 4:41 PM

## 2016-05-14 NOTE — Care Management Note (Signed)
Case Management Note  Patient Details  Name: Henry Ford MRN: [id number] Date of Birth: [birth year]  Subjective/Objective:    Pt admitted with CVA. He is from home with spouse.    Action/Plan: PT recommendation is for [facility] services. CM following for d/c needs.   Expected Discharge Date:                  Expected Discharge Plan:  [place]  In-House Referral:     Discharge planning Services     Post Acute Care Choice:    Choice offered to:     DME Arranged:    DME Agency:     HH Arranged:    [place] Agency:     Status of Service:  In process, will continue to follow  If discussed at Long Length of Stay Meetings, dates discussed:    Additional Comments:  Pollie Friar, RN 05/14/2016, 10:53 AM

## 2016-05-14 NOTE — Progress Notes (Signed)
PROGRESS NOTE  [place] ZYS:[id number] DOB: [birth year] DOA: 05/13/2016 PCP: [name], DO  Brief History:  81 year old male with a history of hypertension, peripheral vascular disease status post right iliac stent, DVT of LE, PE, and newly diagnosed bronchogenic carcinoma on 05/07/16.  His lung cancer was clinically diagnosed on the CTA of the chest that was obtained on 05/07/16 when he had an incidental finding of right upper lobe spiculated lesion measuring 3.2 x 2.68 m with numerous other pulmonary nodules with associated mediastinal lymph adenopathy. The patient was started on apixaban on 05/08/2016. Venous duplex of the lower extremity showed bilateral DVT on 05/08/2016. The patient was seen by medical oncology, Dr. Marin Olp on 05/10/2016 whom recommended outpatient PET scan, MR brain and tissue biopsy. The patient was set up to see Dr. Lamonte Sakai for possible bronchoscopy with bx.  Unfortunately, the patient developed a two-day history of left visual field deficits and headache associated with confusion and lethargy. A result, the patient was brought to emergency department on 05/13/2016 for further evaluation. CT of the brain showed an acute versus subacute right PCA infarct.  The pt was admitted for full stroke work up and Neurology was consulted  Assessment/Plan: Right PCA Stroke -Neurology Consult -PT/OT evaluation--home health PT -Speech therapy eval -CT brain--acute versus subacute right PCA infarct -MRI/MRA brain--pending -Carotid Duplex--pending -Echo--pending -LDL--102 -HbA1C--pending -Continue apixaban  DVT, PE  - Diagnosed 05/07/16 - Likely hypercoagulable state d/t newly identified lung ca  - continue apixaban--monitor Hgb -05/13/2016 EKG--personally reviewed-sinus rhythm, nonspecific ST changes  Bronchogenic carcinoma  - Identified on CT from 6/19 with apparent primary in RUL and mediastinal adenopathy  - Has established with oncology, Dr. Marin Olp,  and has been referred for tissue biopsy  - Ongoing management per oncology   Moderate Protein-calorie malnutrition  - Serum albumin 2.8; patient appears cachectic with bitemporal wasting  - Dietary consultation requested-->continue ensure  CKD stage III  - SCr 1.43 at time of admission, consistent with his apparent baseline  - Avoid nephrotoxins, renally-dose medications  -baseline 1.3-1.5 -am BMP  BPH  - Stable, continue current management with finasteride and terazosin   Normocytic anemia  - Hgb 8.5 on admission, slightly down from recent priors in 9-10 range  - No obvious bleeding; pt denies melena or hematochezia  - Monitor with periodic CBC    Disposition Plan:   Home in 1-2 days  Family Communication:   No Family at bedside;  Total time 35 min; >50% spent counseling and coordinating care;  [phone]   Consultants:  Neurology  Code Status:  DNR  DVT Prophylaxis:  apixaban  Procedures: As Listed in Progress Note Above  Antibiotics: None    Subjective: Patient continued to complain of intermittent headache. States that his headache is parietal area without worsening. Denies any visual disturbance. Denies any focal extremity weakness. Denies any fevers, chills, chest pain,  nausea, vomiting, diarrhea. He has some shortness of breath but this is unchanged. Denies any hemoptysis, hematochezia, melena.  Objective: Filed Vitals:   05/14/16 0130 05/14/16 0330 05/14/16 0530 05/14/16 0939  BP: 145/68 129/47 148/55 158/70  Pulse: 71 62 60 71  Temp: 99 F (37.2 C) 99.1 F (37.3 C) 98.9 F (37.2 C) 98.4 F (36.9 C)  TempSrc: Oral Oral Oral Oral  Resp: '16 16 16 20  '$ SpO2: 96% 96% 96% 97%    Intake/Output Summary (Last 24 hours) at 05/14/16 1311 Last data filed at 05/14/16 1029  Gross  per 24 hour  Intake      0 ml  Output    725 ml  Net   -725 ml   Weight change:  Exam:   General:  Pt is alert, follows commands appropriately, not in acute  distress  HEENT: No icterus, No thrush, No neck mass, [place]/AT  Cardiovascular: RRR, S1/S2, no rubs, no gallops  Respiratory: Diminished breath sounds bilateral. Right basilar crackles. No wheeze.  Abdomen: Soft/+BS, non tender, non distended, no guarding  Extremities: L>R LE edema, No lymphangitis, No petechiae, No rashes, no synovitis   Data Reviewed: I have personally reviewed following labs and imaging studies Basic Metabolic Panel:  Recent Labs Lab 05/08/16 1154 05/10/16 1301 05/13/16 2246  NA 142 143 140  K 4.3 4.3 4.0  CL 110  --  112*  CO2 18* 19* 20*  GLUCOSE 87 107 89  BUN 25 26.4* 21*  CREATININE 1.32* 1.3 1.43*  CALCIUM 8.3* 8.5 8.8*   Liver Function Tests:  Recent Labs Lab 05/08/16 1154 05/10/16 1301 05/13/16 2246  AST '14 22 18  '$ ALT 9 12 10*  ALKPHOS 70 79 67  BILITOT 0.3 0.40 0.5  PROT 6.7 6.8 6.2*  ALBUMIN 3.4* 2.7* 2.8*   No results for input(s): LIPASE, AMYLASE in the last 168 hours. No results for input(s): AMMONIA in the last 168 hours. Coagulation Profile:  Recent Labs Lab 05/13/16 2246  INR 2.12*   CBC:  Recent Labs Lab 05/08/16 1154 05/13/16 2246  WBC 12.8* 13.2*  NEUTROABS 7808*  --   HGB 9.3* 8.5*  HCT 28.8* 26.7*  MCV 90.3 89.0  PLT 251 283   Cardiac Enzymes: No results for input(s): CKTOTAL, CKMB, CKMBINDEX, TROPONINI in the last 168 hours. BNP: Invalid input(s): POCBNP CBG:  Recent Labs Lab 05/14/16 0031  GLUCAP 87   HbA1C: No results for input(s): HGBA1C in the last 72 hours. Urine analysis:    Component Value Date/Time   COLORURINE YELLOW 05/14/2016 0006   APPEARANCEUR CLOUDY* 05/14/2016 0006   LABSPEC 1.015 05/14/2016 0006   PHURINE 5.5 05/14/2016 0006   GLUCOSEU NEGATIVE 05/14/2016 0006   HGBUR LARGE* 05/14/2016 0006   BILIRUBINUR NEGATIVE 05/14/2016 0006   KETONESUR NEGATIVE 05/14/2016 0006   PROTEINUR NEGATIVE 05/14/2016 0006   NITRITE POSITIVE* 05/14/2016 0006   LEUKOCYTESUR LARGE* 05/14/2016  0006   Sepsis Labs: '@LABRCNTIP'$ (procalcitonin:4,lacticidven:4) )No results found for this or any previous visit (from the past 240 hour(s)).   Scheduled Meds: . apixaban  10 mg Oral BID  . [START ON 05/15/2016] apixaban  5 mg Oral BID  . ferrous sulfate  325 mg Oral BID WC  . finasteride  5 mg Oral Daily  . multivitamin with minerals  1 tablet Oral Daily  . omega-3 acid ethyl esters  1,000 mg Oral Daily  . pantoprazole  40 mg Oral Daily  . terazosin  10 mg Oral QHS  . vitamin B-12  1,000 mcg Oral Daily   Continuous Infusions: . sodium chloride 75 mL/hr at 05/14/16 0533    Procedures/Studies: Dg Chest 2 View  05/03/2016  CLINICAL DATA:  Shortness of breath since stent placement in February of 2017. EXAM: CHEST  2 VIEW COMPARISON:  None. FINDINGS: Heart size is upper normal. Lungs are hyperexpanded. There is a masslike density within the suprahilar right lung, measuring approximately 3 cm greatest dimension. Smaller nodular densities are seen along the periphery of the left lung there is probable scarring and/or small effusion at the left lung base. There is  a compression fracture deformity at the thoracolumbar junction which is of uncertain age. IMPRESSION: 1. Masslike density within the right upper lung, suprahilar region, measuring approximately 3 cm greatest dimension. There are no prior studies available for comparison. This is concerning for neoplastic mass. Recommend chest CT for further characterization. 2. Additional smaller nodular densities along the periphery of the left lung. These are also suspicious for neoplastic nodules. 3. Hyperexpanded lungs indicating COPD/emphysema. 4. Compression fracture deformity at the thoracolumbar junction, of uncertain age but favored to be chronic. This also could be further characterized with CT. These results will be called to the ordering clinician or representative by the Radiologist Assistant, and communication documented in the PACS or zVision  Dashboard. Electronically Signed   By: Franki Cabot M.D.   On: 05/03/2016 17:05   Ct Head Wo Contrast  05/13/2016  CLINICAL DATA:  81 year old male with vision loss and altered mental status EXAM: CT HEAD WITHOUT CONTRAST TECHNIQUE: Contiguous axial images were obtained from the base of the skull through the vertex without intravenous contrast. COMPARISON:  None. FINDINGS: There is no acute intracranial hemorrhage. There is mild to moderate age-related atrophy and chronic microvascular ischemic changes. There is a focal area of lower attenuation with loss of gray-white matter discrimination and effacement of the sulci in the right occipital lobe medially compatible with a right PCA territory infarct, likely acute or subacute. Clinical correlation is recommended. There is associated minimal mass effect on the adjacent brain parenchyma. No midline shift. There is mild mucoperiosteal thickening of paranasal sinuses with partial opacification of the ethmoid air cells. No air-fluid level. The mastoid air cells are clear. The calvarium is intact. IMPRESSION: No acute intracranial hemorrhage. Findings compatible with right PCA territory infarct, likely acute or subacute. Correlation with clinical exam recommended. MRI may provide better evaluation. These results were called by telephone at the time of interpretation on 05/13/2016 at 11:28 pm to Dr. Wyvonnia Dusky, who verbally acknowledged these results. Electronically Signed   By: Anner Crete M.D.   On: 05/13/2016 23:33   Ct Chest W Contrast  05/11/2016  ADDENDUM REPORT: 05/11/2016 15:34 ADDENDUM: Post processing for super D guidance was requested on 05/11/2016. The corresponding images were added to this examination. Electronically Signed   By: Richardean Sale M.D.   On: 05/11/2016 15:34  05/11/2016  CLINICAL DATA:  Shortness of breath with difficulty breathing and weight loss. Abnormal chest x-ray with findings worrisome for neoplasm. No given history of  malignancy. EXAM: CT CHEST, ABDOMEN, AND PELVIS WITH CONTRAST TECHNIQUE: Multidetector CT imaging of the chest, abdomen and pelvis was performed following the standard protocol during bolus administration of intravenous contrast. CONTRAST:  33m ISOVUE-300 IOPAMIDOL (ISOVUE-300) INJECTION 61% COMPARISON:  Chest radiograph 05/03/2016. FINDINGS: CT CHEST Cardiovascular: The heart size is normal. There is no pericardial effusion. There is atherosclerosis of the aorta, great vessels and coronary arteries. Although not performed as a CTA, there is evidence of acute/subacute pulmonary embolism within the left lower lobe pulmonary artery. Mediastinum/Nodes: There are multiple enlarged mediastinal and right hilar lymph nodes. There is a dominant precarinal nodal mass measuring 3.9 x 2.4 cm on image 28. There is a right hilar node measuring up to 1.7 cm on image 32. The thyroid gland, trachea and esophagus demonstrate no significant findings. Lungs/Pleura: There is no pleural effusion. This dominant spiculated right upper lobe lesion measuring 3.2 x 2.6 cm on image 59, likely a primary lung cancer. There are numerous other pulmonary nodules bilaterally suspicious for metastatic disease.  The largest of these other nodules measures 14 x 13 mm in the left lower lobe on image 121. There are multiple other smaller lesions, more than 10 in each lung. Musculoskeletal/Chest wall: No chest wall mass or suspicious osseous findings. There is a mild superior endplate compression deformity at T8 which appears benign. CT ABDOMEN AND PELVIS FINDINGS Hepatobiliary: The liver demonstrates mild contour irregularity, but no focal abnormality. No evidence of gallstones, gallbladder wall thickening or biliary dilatation. Pancreas: Unremarkable. No pancreatic ductal dilatation or surrounding inflammatory changes. Spleen: Normal in size without focal abnormality. Adrenals/Urinary Tract: 15 mm left adrenal nodule measures 5 HU on the delayed  images, likely an incidental adenoma. The right adrenal gland appears normal. 5.1 cm lesion projecting from the lower pole of the left kidney measures slightly higher than water density (21 -23 HU), although is probably a cyst. There are other smaller low-density renal lesions which are probably cysts as well. No definite enhancing renal mass or hydronephrosis. There is no evidence of ureteral calculus. The bladder is heavily trabeculated. There is a calculus within a posterior bladder diverticulum. Stomach/Bowel: No evidence of bowel wall thickening, distention or surrounding inflammatory change. The stomach is decompressed. Vascular/Lymphatic: There are no enlarged abdominal or pelvic lymph nodes. There is extensive aortic and branch vessel atherosclerosis. There is nearly occlusive thrombus within the left femoral vein. This extends into the left external iliac vein. There is also nonocclusive thrombus in the right femoral vein. The IVC appears patent. Reproductive: There are extensive calcifications throughout the prostate gland. Other: No ascites or peritoneal nodularity. Musculoskeletal: No acute or significant osseous findings. There is a superior endplate compression deformity at L1 with an associated Schmorl's node which appears benign. Degenerative changes are present in the lower lumbar spine. IMPRESSION: 1. Findings are consistent with right upper lobe lung cancer with multiple enlarged mediastinal and right hilar lymph nodes as well as numerous bilateral pulmonary nodules consistent with metastases. 2. No definite evidence of metastatic disease within the abdomen or pelvis. 3. Bilateral DVT with thromboembolic disease (pulmonary embolism) within the left pulmonary artery. 4. Other incidental findings include the presence of extensive atherosclerosis, trabeculated bladder containing a calculus, probable incidental left adrenal adenoma, bilateral renal cysts, and chronic appearing thoracolumbar  compression deformities. 5. Critical Value/emergent results were called by telephone at the time of interpretation on 05/07/2016 at 4:38 pm to [facility], [place] for Dr. [name] , who verbally acknowledged these results. Electronically Signed: By: Richardean Sale M.D. On: 05/07/2016 16:39   Ct Abdomen Pelvis W Contrast  05/11/2016  ADDENDUM REPORT: 05/11/2016 15:34 ADDENDUM: Post processing for super D guidance was requested on 05/11/2016. The corresponding images were added to this examination. Electronically Signed   By: Richardean Sale M.D.   On: 05/11/2016 15:34  05/11/2016  CLINICAL DATA:  Shortness of breath with difficulty breathing and weight loss. Abnormal chest x-ray with findings worrisome for neoplasm. No given history of malignancy. EXAM: CT CHEST, ABDOMEN, AND PELVIS WITH CONTRAST TECHNIQUE: Multidetector CT imaging of the chest, abdomen and pelvis was performed following the standard protocol during bolus administration of intravenous contrast. CONTRAST:  67m ISOVUE-300 IOPAMIDOL (ISOVUE-300) INJECTION 61% COMPARISON:  Chest radiograph 05/03/2016. FINDINGS: CT CHEST Cardiovascular: The heart size is normal. There is no pericardial effusion. There is atherosclerosis of the aorta, great vessels and coronary arteries. Although not performed as a CTA, there is evidence of acute/subacute pulmonary embolism within the left lower lobe pulmonary artery. Mediastinum/Nodes: There are multiple enlarged mediastinal and right  hilar lymph nodes. There is a dominant precarinal nodal mass measuring 3.9 x 2.4 cm on image 28. There is a right hilar node measuring up to 1.7 cm on image 32. The thyroid gland, trachea and esophagus demonstrate no significant findings. Lungs/Pleura: There is no pleural effusion. This dominant spiculated right upper lobe lesion measuring 3.2 x 2.6 cm on image 59, likely a primary lung cancer. There are numerous other pulmonary nodules bilaterally suspicious for metastatic disease.  The largest of these other nodules measures 14 x 13 mm in the left lower lobe on image 121. There are multiple other smaller lesions, more than 10 in each lung. Musculoskeletal/Chest wall: No chest wall mass or suspicious osseous findings. There is a mild superior endplate compression deformity at T8 which appears benign. CT ABDOMEN AND PELVIS FINDINGS Hepatobiliary: The liver demonstrates mild contour irregularity, but no focal abnormality. No evidence of gallstones, gallbladder wall thickening or biliary dilatation. Pancreas: Unremarkable. No pancreatic ductal dilatation or surrounding inflammatory changes. Spleen: Normal in size without focal abnormality. Adrenals/Urinary Tract: 15 mm left adrenal nodule measures 5 HU on the delayed images, likely an incidental adenoma. The right adrenal gland appears normal. 5.1 cm lesion projecting from the lower pole of the left kidney measures slightly higher than water density (21 -23 HU), although is probably a cyst. There are other smaller low-density renal lesions which are probably cysts as well. No definite enhancing renal mass or hydronephrosis. There is no evidence of ureteral calculus. The bladder is heavily trabeculated. There is a calculus within a posterior bladder diverticulum. Stomach/Bowel: No evidence of bowel wall thickening, distention or surrounding inflammatory change. The stomach is decompressed. Vascular/Lymphatic: There are no enlarged abdominal or pelvic lymph nodes. There is extensive aortic and branch vessel atherosclerosis. There is nearly occlusive thrombus within the left femoral vein. This extends into the left external iliac vein. There is also nonocclusive thrombus in the right femoral vein. The IVC appears patent. Reproductive: There are extensive calcifications throughout the prostate gland. Other: No ascites or peritoneal nodularity. Musculoskeletal: No acute or significant osseous findings. There is a superior endplate compression deformity  at L1 with an associated Schmorl's node which appears benign. Degenerative changes are present in the lower lumbar spine. IMPRESSION: 1. Findings are consistent with right upper lobe lung cancer with multiple enlarged mediastinal and right hilar lymph nodes as well as numerous bilateral pulmonary nodules consistent with metastases. 2. No definite evidence of metastatic disease within the abdomen or pelvis. 3. Bilateral DVT with thromboembolic disease (pulmonary embolism) within the left pulmonary artery. 4. Other incidental findings include the presence of extensive atherosclerosis, trabeculated bladder containing a calculus, probable incidental left adrenal adenoma, bilateral renal cysts, and chronic appearing thoracolumbar compression deformities. 5. Critical Value/emergent results were called by telephone at the time of interpretation on 05/07/2016 at 4:38 pm to [facility], [place] for Dr. [name] , who verbally acknowledged these results. Electronically Signed: By: Richardean Sale M.D. On: 05/07/2016 16:39   US Venous Img Lower Bilateral  05/08/2016  CLINICAL DATA:  History of lung carcinoma. Thrombus seen in the femoral veins bilateral recent CT scan. EXAM: BILATERAL LOWER EXTREMITY VENOUS DOPPLER ULTRASOUND TECHNIQUE: Gray-scale sonography with graded compression, as well as color Doppler and duplex ultrasound were performed to evaluate the lower extremity deep venous systems from the level of the common femoral vein and including the common femoral, femoral, profunda femoral, popliteal and calf veins including the posterior tibial, peroneal and gastrocnemius veins when visible. The superficial great saphenous  vein was also interrogated. Spectral Doppler was utilized to evaluate flow at rest and with distal augmentation maneuvers in the common femoral, femoral and popliteal veins. COMPARISON:  CT chest, abdomen and pelvis 05/07/2016. FINDINGS: RIGHT LOWER EXTREMITY Common Femoral Vein: Partially occlusive  thrombus identified. Saphenofemoral Junction: Partially occlusive thrombus identified. Profunda Femoral Vein: Partially occlusive thrombus identified. Femoral Vein: Occlusive thrombus identified. Popliteal Vein: Occlusive thrombus identified. Calf Veins: Partially occlusive thrombus is seen in the posterior tibial vein. Peroneal vein is not visualized. Thrombus is also identified in the gastrocnemius vein. Lesser saphenous Vein: Thrombus identified. Other Findings:  None. LEFT LOWER EXTREMITY Common Femoral Vein: Occlusive thrombus identified. Saphenofemoral Junction: Occlusive thrombus identified. Profunda Femoral Vein: Occlusive thrombus identified. Femoral Vein: Occlusive thrombus identified. Popliteal Vein: Occlusive thrombus identified. Calf Veins: Not visualized. Superficial Great Saphenous Vein: Not reported. Other Findings:  None. IMPRESSION: Extensive bilateral deep venous thrombosis. In the left leg, occlusive clot is seen from the common femoral through the popliteal vein. On the right, occlusive thrombus is seen from the femoral through the popliteal vein. Critical Value/emergent results were called by telephone at the time of interpretation on 05/08/2016 at 3:47 pm to Dr. [name] , who verbally acknowledged these results. Electronically Signed   By: Inge Rise M.D.   On: 05/08/2016 15:49    Odelia Graciano, DO  [facility] Pager [phone]  If 7PM-7AM, please contact night-coverage [email or web address] Password [email or web address] 05/14/2016, 1:11 PM   LOS: 0 days

## 2016-05-14 NOTE — Progress Notes (Signed)
Preliminary results by tech - Carotid Duplex Completed. Right ICA demonstrated a moderate amount of calcified plaque suggestive of a 60-79% stenosis. The left ICA demonstrated mild plaque with a 1-39% stenosis. Vertebral arteries demonstrated antegrade flow. Oda Cogan, BS, RDMS, RVT

## 2016-05-14 NOTE — Consult Note (Signed)
Admission H&P    Chief Complaint: New onset left visual field defect.  HPI: Henry Ford is an 81 y.o. male with a history of hypertension, peripheral vascular disease, GI hemorrhage and recently diagnosed lung cancer, brought to the emergency room for evaluation of confusion and difficulty seeing on left side. Symptoms reportedly were first noted earlier today. Patient showed signs of confusion and also had difficulty working a Estate manager/land agent. He has no previous history of stroke nor TIA. He's been taking Eliquis for anticoagulation. CT scan of his head showed findings consistent with an acute right PCA territory ischemic infarction. No evidence of hemorrhage was seen. He was last known well at bedtime last night which was about 10:00 PM. NIH stroke score was 4.  LSN: 10:00 PM on 05/12/2016 tPA Given: No: Beyond time window for treatment consideration; on anticoagulation with Eliquis mRankin:  Past Medical History  Diagnosis Date  . Hypertension   . Prostate enlargement   . Peripheral vascular disease ([place]) 12/2015    non-healing ulcer right Hallux. S/P angioplasty and stenting of right common iliac 01/02/2016.  . Right foot pain 12/30/2015    Admitted to [facility] 12/11/2015, discharge 12/14/2015, diagnoses included acute right great toe infection/cellulitis, peripheral vascular disease. ABI demonstrated arterial occlusive disease worse on left. Is referred outpatient to vascular The CT scan documents for copy of his discharge summary. He should follow up with Dr. Ladona Horns, general surgery at [facility]. No dated 02/06/2  . Anemia 12/2015  . Colon polyps ~ 2001    unknown pathology, colonoscopy done at outside hospital.   . Heme positive stool 12/2015  . Gastrointestinal hemorrhage with melena     Status post EGD and blood transfusion March 2017   . Ischemic foot ulcer due to atherosclerosis of native artery of limb ([place]) 01/10/2016  . Cancer ([place])     lung    Past  Surgical History  Procedure Laterality Date  . Hydrocele excision  1956  . Peripheral vascular catheterization N/A 01/02/2016    Procedure: Abdominal Aortogram w/Lower Extremity;  Surgeon: Angelia Mould, MD;  Location: [place] [facility];  Service: Cardiovascular;  Laterality: N/A;  . Peripheral vascular catheterization  01/02/2016    Procedure: Peripheral Vascular Intervention;  Surgeon: Angelia Mould, MD;  Location: [place] [facility];  Service: Cardiovascular;;  . Esophagogastroduodenoscopy N/A 01/21/2016    Procedure: ESOPHAGOGASTRODUODENOSCOPY (EGD);  Surgeon: Irene Shipper, MD;  Location: [facility];  Service: Endoscopy;  Laterality: N/A;    Family History  Problem Relation Age of Onset  . Colon cancer Neg Hx   . Diabetes Brother    Social History:  reports that he quit smoking about 3 months ago. His smoking use included Cigarettes. He started smoking about 65 years ago. He has a 15 pack-year smoking history. He has never used smokeless tobacco. He reports that he drinks about 6.0 oz of alcohol per week. He reports that he does not use illicit drugs.  Allergies: No Known Allergies  Medications: Preadmission medications were reviewed by me.  ROS: Unreliable due to patient's confusion.  Physical Examination: Blood pressure 140/64, pulse 65, temperature 98.2 F (36.8 C), temperature source Oral, resp. rate 12, SpO2 97 %.  HEENT-  Normocephalic, no lesions, without obvious abnormality.  Normal external eye and conjunctiva.  Normal TM's bilaterally.  Normal auditory canals and external ears. Normal external nose, mucus membranes and septum.  Normal pharynx. Neck supple with no masses, nodes, nodules or enlargement. Cardiovascular - regular  rate and rhythm, S1, S2 normal, no murmur, click, rub or gallop Lungs - chest clear, no wheezing, rales, normal symmetric air entry Abdomen - soft, non-tender; bowel sounds normal; no masses,  no organomegaly Extremities - moderate  edema of distal legs and feet, left greater than right  Neurologic Examination: Mental Status: Alert, disoriented to current year and age, no acute distress.  Speech fluent without evidence of aphasia. Able to follow commands without difficulty. Cranial Nerves: II-dense left homonymous hemianopsia. III/IV/VI-Pupils were equal and reacted normally to light. Extraocular movements were full and conjugate.    V/VII-no facial numbness and no facial weakness. VIII-normal. X-normal speech and symmetrical palatal movement. XI: trapezius strength/neck flexion strength normal bilaterally XII-midline tongue extension with normal strength. Motor: 5/5 bilaterally with normal tone and bulk Sensory: Normal throughout. Deep Tendon Reflexes: 2+ and symmetric. Plantars: Mute bilaterally Cerebellar: Normal finger-to-nose testing. Carotid auscultation: Normal  Results for orders placed or performed during the hospital encounter of 05/13/16 (from the past 48 hour(s))  CBC     Status: Abnormal   Collection Time: 05/13/16 10:46 PM  Result Value Ref Range   WBC 13.2 (H) 4.0 - 10.5 K/uL   RBC 3.00 (L) 4.22 - 5.81 MIL/uL   Hemoglobin 8.5 (L) 13.0 - 17.0 g/dL   HCT 26.7 (L) 39.0 - 52.0 %   MCV 89.0 78.0 - 100.0 fL   MCH 28.3 26.0 - 34.0 pg   MCHC 31.8 30.0 - 36.0 g/dL   RDW 14.8 11.5 - 15.5 %   Platelets 283 150 - 400 K/uL  Protime-INR     Status: Abnormal   Collection Time: 05/13/16 10:46 PM  Result Value Ref Range   Prothrombin Time 23.6 (H) 11.6 - 15.2 seconds   INR 2.12 (H) 0.00 - 1.49  I-Stat CG4 Lactic Acid, ED     Status: None   Collection Time: 05/13/16 10:54 PM  Result Value Ref Range   Lactic Acid, Venous 0.69 0.5 - 2.0 mmol/L   Ct Head Wo Contrast  05/13/2016  CLINICAL DATA:  81 year old male with vision loss and altered mental status EXAM: CT HEAD WITHOUT CONTRAST TECHNIQUE: Contiguous axial images were obtained from the base of the skull through the vertex without intravenous contrast.  COMPARISON:  None. FINDINGS: There is no acute intracranial hemorrhage. There is mild to moderate age-related atrophy and chronic microvascular ischemic changes. There is a focal area of lower attenuation with loss of gray-white matter discrimination and effacement of the sulci in the right occipital lobe medially compatible with a right PCA territory infarct, likely acute or subacute. Clinical correlation is recommended. There is associated minimal mass effect on the adjacent brain parenchyma. No midline shift. There is mild mucoperiosteal thickening of paranasal sinuses with partial opacification of the ethmoid air cells. No air-fluid level. The mastoid air cells are clear. The calvarium is intact. IMPRESSION: No acute intracranial hemorrhage. Findings compatible with right PCA territory infarct, likely acute or subacute. Correlation with clinical exam recommended. MRI may provide better evaluation. These results were called by telephone at the time of interpretation on 05/13/2016 at 11:28 pm to Dr. Wyvonnia Dusky, who verbally acknowledged these results. Electronically Signed   By: Anner Crete M.D.   On: 05/13/2016 23:33    Assessment: 81 y.o. male with hypertension and peripheral vascular disease on anticoagulation, as well as newly diagnosed lung cancer, presenting with acute right PCA territory ischemic infarction. Etiology for confusion is unclear, but possibly in part related to his acute stroke.  Stroke  Risk Factors - hypertension  Plan: 1. HgbA1c, fasting lipid panel 2. MRI, MRA  of the brain without contrast 3. PT consult, OT consult, Speech consult 4. Echocardiogram 5. Carotid dopplers 6. Prophylactic therapy-Anticoagulation: Eliquis 7. Risk factor modification 8. Telemetry monitoring  C.R. Nicole Kindred, MD [facility] [phone]  05/14/2016, 12:07 AM

## 2016-05-14 NOTE — Progress Notes (Signed)
Initial Nutrition Assessment  DOCUMENTATION CODES:   Non-severe (moderate) malnutrition in context of chronic illness  INTERVENTION:  Provide Ensure Enlive po BID, each supplement provides 350 kcal and 20 grams of protein Encourage PO intake   NUTRITION DIAGNOSIS:   Malnutrition related to chronic illness, poor appetite as evidenced by moderate depletion of body fat, moderate depletions of muscle mass.   GOAL:   Patient will meet greater than or equal to 90% of their needs   MONITOR:   PO intake, Supplement acceptance, Labs, Weight trends, I & O's, Skin  REASON FOR ASSESSMENT:   Consult Assessment of nutrition requirement/status  ASSESSMENT:   81 y.o. male with medical history significant for hypertension, peripheral arterial disease status post stenting of right common iliac artery, anemia of chronic disease, and difficult recent course involving weight loss, diagnosis of lower extremity DVT and bilateral PE now on Eliquis, new diagnosis of bronchogenic carcinoma with local metastases referred oncology but not yet treated, now presenting to the emergency department with 2 days of left-sided visual field deficit, intermittent headaches, and 1 day of confusion and lethargy.   Pt reports having a poor appetite and taste changes which he relates to cancer. He reports losing from 175 lbs to 150 lbs, but then regaining some weight over the past few months. He reports eating 2 meals daily and drinking Ensure as needed to supplement diet. He reports eating less than 50% of breakfast this morning. He has moderate muscle and moderate fat wasting per nutrition-focused physical exam, but weight history shows that pt's weight has been stable for the past 3-4 months. He is asking for salt with his food, but also reports having swelling in his legs.   Labs: low hemoglobin  Diet Order:  Diet Heart Room service appropriate?: Yes; Fluid consistency:: Thin  Skin:  Reviewed, no issues  Last  BM:  unknown  Height:   Ht Readings from Last 1 Encounters:  05/11/16 '5\' 8"'$  (1.727 m)    Weight:   Wt Readings from Last 1 Encounters:  05/11/16 156 lb 3.2 oz (70.852 kg)    Ideal Body Weight:  70 kg  BMI:  23.7 kg/(m^2)  Estimated Nutritional Needs:   Kcal:  1800-2000  Protein:  85-95 grams  Fluid:  1.8-2 L/day  EDUCATION NEEDS:   No education needs identified at this time  Staves, LDN Inpatient Clinical Dietitian Pager: [phone] After Hours Pager: [phone]

## 2016-05-14 NOTE — Care Management Important Message (Signed)
Important Message  Patient Details  Name: Henry Ford MRN: [id number] Date of Birth: 04/16/1935   Medicare Important Message Given:  Yes    [name] 05/14/2016, 8:53 AM

## 2016-05-14 NOTE — H&P (Signed)
History and Physical    Nealy Karapetian KVQ:[id number] DOB: Mar 30, 1935 DOA: 05/13/2016  PCP: [name], DO   Patient coming from: Home   Chief Complaint: Left-sided vision loss, confusion   HPI: Henry Ford is an unfortunate 81 y.o. male with medical history significant for hypertension, peripheral arterial disease status post stenting of right common iliac artery, anemia of chronic disease, and difficult recent course involving weight loss, diagnosis of lower extremity DVT and bilateral PE now on Eliquis, new diagnosis of bronchogenic carcinoma with local metastases referred oncology but not yet treated, now presenting to the emergency department with 2 days of left-sided visual field deficit, intermittent headaches, and 1 day of confusion and lethargy. Patient was just diagnosed with PE's bilaterally on 05/07/2016 and this study also revealed a suspected primary bronchogenic carcinoma in the right upper lobe with mediastinal adenopathy. He was started on Eliuis on 05/08/2016. Patient noted the visual field deficit 2 days ago, then some intermittent headaches the following day, and now today his wife brings him into the emergency department with concern for developing confusion and lethargy. Patient denies any focal numbness or weakness, change in hearing, or loss of coordination. He denies chest pain or palpitations and denies difficulty swallowing or trouble with his speech.  ED Course: Upon arrival to the ED, patient is found to be afebrile, saturating well on room air, and with vital signs stable. EKG demonstrates a sinus rhythm with VPCs and left anterior fascicular block. Head CT findings are consistent with an acute or subacute right PCA territory ischemic infarction. CMP features a BUN of 21 and serum creatinine 1.43 which appears consistent with his baseline. Serum albumin is 2.8. CBC is notable for a leukocytosis to 13,200 and hemoglobin of 8.5 which is slightly lower than is  apparent baseline. Lactic acid is reassuring at 0.69 and INR is elevated to 2.12. Code stroke was called and the patient has been evaluated by neurology. He'll be admitted to the telemetry unit for ongoing evaluation and management of acute ischemic right PCA infarct.  Review of Systems:  All other systems reviewed and apart from HPI, are negative.  Past Medical History  Diagnosis Date  . Hypertension   . Prostate enlargement   . Peripheral vascular disease ([place]) 12/2015    non-healing ulcer right Hallux. S/P angioplasty and stenting of right common iliac 01/02/2016.  . Right foot pain 12/30/2015    Admitted to [facility] 12/11/2015, discharge 12/14/2015, diagnoses included acute right great toe infection/cellulitis, peripheral vascular disease. ABI demonstrated arterial occlusive disease worse on left. Is referred outpatient to vascular The CT scan documents for copy of his discharge summary. He should follow up with Dr. Ladona Horns, general surgery at [facility]. No dated 02/06/2  . Anemia 12/2015  . Colon polyps ~ 2001    unknown pathology, colonoscopy done at outside hospital.   . Heme positive stool 12/2015  . Gastrointestinal hemorrhage with melena     Status post EGD and blood transfusion March 2017   . Ischemic foot ulcer due to atherosclerosis of native artery of limb ([place]) 01/10/2016  . Cancer ([place])     lung    Past Surgical History  Procedure Laterality Date  . Hydrocele excision  1956  . Peripheral vascular catheterization N/A 01/02/2016    Procedure: Abdominal Aortogram w/Lower Extremity;  Surgeon: Angelia Mould, MD;  Location: [place] [facility];  Service: Cardiovascular;  Laterality: N/A;  . Peripheral vascular catheterization  01/02/2016    Procedure: Peripheral Vascular  Intervention;  Surgeon: Angelia Mould, MD;  Location: [place] [facility];  Service: Cardiovascular;;  . Esophagogastroduodenoscopy N/A 01/21/2016    Procedure:  ESOPHAGOGASTRODUODENOSCOPY (EGD);  Surgeon: Irene Shipper, MD;  Location: [facility];  Service: Endoscopy;  Laterality: N/A;     reports that he quit smoking about 3 months ago. His smoking use included Cigarettes. He started smoking about 65 years ago. He has a 15 pack-year smoking history. He has never used smokeless tobacco. He reports that he drinks about 6.0 oz of alcohol per week. He reports that he does not use illicit drugs.  No Known Allergies  Family History  Problem Relation Age of Onset  . Colon cancer Neg Hx   . Diabetes Brother      Prior to Admission medications   Medication Sig Start Date End Date Taking? Authorizing Provider  apixaban (ELIQUIS) 5 MG TABS tablet Start taking 2 tablet (10 mg total) by mouth twice per day for severn days, then take 1 tablet (5 mg total) by mouth twice per day 05/07/16  Yes [name], DO  co-enzyme Q-10 30 MG capsule Take 30 mg by mouth daily.   Yes Historical Provider, MD  ferrous sulfate 325 (65 FE) MG tablet Take 325 mg by mouth 2 (two) times daily with a meal.   Yes Historical Provider, MD  finasteride (PROSCAR) 5 MG tablet Take 5 mg by mouth daily. Reported on 02/01/2016   Yes Historical Provider, MD  losartan (COZAAR) 100 MG tablet Take 100 mg by mouth daily.   Yes Historical Provider, MD  Multiple Vitamin (MULTIVITAMIN) tablet Take 1 tablet by mouth daily.   Yes Historical Provider, MD  Omega 3 1000 MG CAPS Take 1,000 mg by mouth daily.   Yes Historical Provider, MD  pantoprazole (PROTONIX) 40 MG tablet Take 40 mg by mouth daily.  04/18/16  Yes Historical Provider, MD  Red Yeast Rice 600 MG CAPS Take 600 mg by mouth daily.   Yes Historical Provider, MD  terazosin (HYTRIN) 10 MG capsule Take 10 mg by mouth at bedtime. Reported on 01/20/2016   Yes Historical Provider, MD  vitamin B-12 (CYANOCOBALAMIN) 1000 MCG tablet Take 1,000 mcg by mouth daily.   Yes Historical Provider, MD  vitamin C (ASCORBIC ACID) 500 MG tablet Take 1,000 mg by  mouth daily.   Yes Historical Provider, MD    Physical Exam: Filed Vitals:   05/13/16 2202 05/13/16 2215 05/14/16 0030  BP: 164/63 140/64 153/86  Pulse: 70 65 70  Temp: 98.2 F (36.8 C)    TempSrc: Oral    Resp: '20 12 18  '$ SpO2: 95% 97% 97%      Constitutional: NAD, calm, comfortable, cachectic  Eyes: PERTLA, lids and conjunctivae normal ENMT: Mucous membranes are moist. Posterior pharynx clear of any exudate or lesions.   Neck: normal, supple, no masses, no thyromegaly Respiratory: clear to auscultation bilaterally, no wheezing, no crackles. Normal respiratory effort.    Cardiovascular: S1 & S2 heard, regular rate and rhythm. 1+ pedal edema b/l. No significant JVD. Abdomen: No distension, no tenderness, no masses palpated. Bowel sounds normal.  Musculoskeletal: no clubbing / cyanosis. No joint deformity upper and lower extremities. Normal muscle tone.  Skin: Chronic ulcerations to b/l LE's. Warm, dry, well-perfused. Neurologic: left hemianopsia. PERRL, EOMI, Sensation intact, patellar DTR normal. Babinski down-going bilaterally.  Psychiatric: Normal judgment and insight. Alert and oriented x 3. Normal mood and affect.     Labs on Admission: I have personally reviewed following  labs and imaging studies  CBC:  Recent Labs Lab 05/08/16 1154 05/13/16 2246  WBC 12.8* 13.2*  NEUTROABS 7808*  --   HGB 9.3* 8.5*  HCT 28.8* 26.7*  MCV 90.3 89.0  PLT 251 448   Basic Metabolic Panel:  Recent Labs Lab 05/08/16 1154 05/10/16 1301 05/13/16 2246  NA 142 143 140  K 4.3 4.3 4.0  CL 110  --  112*  CO2 18* 19* 20*  GLUCOSE 87 107 89  BUN 25 26.4* 21*  CREATININE 1.32* 1.3 1.43*  CALCIUM 8.3* 8.5 8.8*   GFR: Estimated Creatinine Clearance: 41.2 mL/min (by C-G formula based on Cr of 1.43). Liver Function Tests:  Recent Labs Lab 05/08/16 1154 05/10/16 1301 05/13/16 2246  AST '14 22 18  '$ ALT 9 12 10*  ALKPHOS 70 79 67  BILITOT 0.3 0.40 0.5  PROT 6.7 6.8 6.2*    ALBUMIN 3.4* 2.7* 2.8*   No results for input(s): LIPASE, AMYLASE in the last 168 hours. No results for input(s): AMMONIA in the last 168 hours. Coagulation Profile:  Recent Labs Lab 05/13/16 2246  INR 2.12*   Cardiac Enzymes: No results for input(s): CKTOTAL, CKMB, CKMBINDEX, TROPONINI in the last 168 hours. BNP (last 3 results) No results for input(s): PROBNP in the last 8760 hours. HbA1C: No results for input(s): HGBA1C in the last 72 hours. CBG:  Recent Labs Lab 05/14/16 0031  GLUCAP 87   Lipid Profile: No results for input(s): CHOL, HDL, LDLCALC, TRIG, CHOLHDL, LDLDIRECT in the last 72 hours. Thyroid Function Tests: No results for input(s): TSH, T4TOTAL, FREET4, T3FREE, THYROIDAB in the last 72 hours. Anemia Panel: No results for input(s): VITAMINB12, FOLATE, FERRITIN, TIBC, IRON, RETICCTPCT in the last 72 hours. Urine analysis: No results found for: COLORURINE, APPEARANCEUR, LABSPEC, PHURINE, GLUCOSEU, HGBUR, BILIRUBINUR, KETONESUR, PROTEINUR, UROBILINOGEN, NITRITE, LEUKOCYTESUR Sepsis Labs: '@LABRCNTIP'$ (procalcitonin:4,lacticidven:4) )No results found for this or any previous visit (from the past 240 hour(s)).   Radiological Exams on Admission: Ct Head Wo Contrast  05/13/2016  CLINICAL DATA:  81 year old male with vision loss and altered mental status EXAM: CT HEAD WITHOUT CONTRAST TECHNIQUE: Contiguous axial images were obtained from the base of the skull through the vertex without intravenous contrast. COMPARISON:  None. FINDINGS: There is no acute intracranial hemorrhage. There is mild to moderate age-related atrophy and chronic microvascular ischemic changes. There is a focal area of lower attenuation with loss of gray-white matter discrimination and effacement of the sulci in the right occipital lobe medially compatible with a right PCA territory infarct, likely acute or subacute. Clinical correlation is recommended. There is associated minimal mass effect on the  adjacent brain parenchyma. No midline shift. There is mild mucoperiosteal thickening of paranasal sinuses with partial opacification of the ethmoid air cells. No air-fluid level. The mastoid air cells are clear. The calvarium is intact. IMPRESSION: No acute intracranial hemorrhage. Findings compatible with right PCA territory infarct, likely acute or subacute. Correlation with clinical exam recommended. MRI may provide better evaluation. These results were called by telephone at the time of interpretation on 05/13/2016 at 11:28 pm to Dr. Wyvonnia Dusky, who verbally acknowledged these results. Electronically Signed   By: Anner Crete M.D.   On: 05/13/2016 23:33    EKG: Independently reviewed. Sinus rhythm, VPC's, LaFB   Assessment/Plan  1. Acute ischemic right PCA stroke  - tPA window had past by time of presentation and he is on Elquis  - Monitor on telemetry  - TTE, carotid dopplers, MRI/MRA brain without contrast, A1c,  fasting lipid panel ordered  - NPO until bedside swallow eval passed  - PT, OT, and SLP evaluations requested  - Secondary ppx with Eliquis    2. DVT, PE  - Diagnosed earlier this month and tolerating Eliquis, will continue  - Likely hypercoagulable state d/t newly identified lung ca    3. Bronchogenic carcinoma  - Identified on CT from 6/19 with apparent primary in RUL and mediastinal adenopathy  - Has established with oncology, Dr. Marin Olp, and has been referred for tissue biopsy  - Ongoing management per oncology   4. Protein-calorie malnutrition  - Serum albumin 2.8; patient appears cachectic with bitemporal wasting  - Dietary consultation requested    5. CKD stage III  - SCr 1.43 at time of admission, consistent with his apparent baseline   - Avoid nephrotoxins, renally-dose medications   6. BPH  - Stable, continue current management with finasteride and terazosin   7. Normocytic anemia  - Hgb 8.5 on admission, slightly down from recent priors in 9-10 range  -  No obvious bleeding; pt denies melena or hematochezia  - Monitor with periodic CBC     DVT prophylaxis: Eliquis  Code Status: Full  Family Communication: Discussed with patient  Disposition Plan: Admit to telemetry   Consults called: Neurology  Admission status: Inpatient    Vianne Bulls, MD [facility] Pager [phone]  If 7PM-7AM, please contact night-coverage [email or web address] Password [email or web address]  05/14/2016, 1:01 AM

## 2016-05-14 NOTE — Progress Notes (Signed)
Upon assessment, patient was noted to have bilateral lower extremity edema, with +2 pitting at ankles.  Patient also has black eschar on his right big toe that covers the bottom side of the whole toe

## 2016-05-14 NOTE — Evaluation (Signed)
Physical Therapy Evaluation Patient Details Name: Cartrell Bentsen MRN: [id number] DOB: 1935/02/24 Today's Date: 05/14/2016   History of Present Illness  Loki Wuthrich is an unfortunate 81 y.o. male with medical history significant for hypertension, peripheral arterial disease status post stenting of right common iliac artery, anemia of chronic disease, and difficult recent course involving weight loss, diagnosis of lower extremity DVT and bilateral PE now on Eliquis, new diagnosis of bronchogenic carcinoma with local metastases referred oncology but not yet treated, now presenting to the emergency department with 2 days of left-sided visual field deficit, intermittent headaches, and 1 day of confusion and lethargy. Patient was just diagnosed with PE's bilaterally on 05/07/2016 and this study also revealed a suspected primary bronchogenic carcinoma in the right upper lobe with mediastinal adenopathy. MRI revealed R PCA infarct.  Clinical Impression  Pt admitted with above. Pt indep PTA now present with bilat L hemianopsia. Pt aware and knows he has to turn head but needs max v/c's to compensate. Pt now requires assist for safe ambulation due to increased falls risk due to impaired vision. When walking down the hall pt unable see any people or objects on the side. Pt aware it'll be unsafe to drive and agree that using an AD during ambulation would decrease risk of falls. Acute PT to con't to follow.    Follow Up Recommendations Home health PT;Supervision/Assistance - 24 hour    Equipment Recommendations  Rolling walker with 5" wheels    Recommendations for Other Services       Precautions / Restrictions Precautions Precautions: Fall Precaution Comments: L heminopsia Restrictions Weight Bearing Restrictions: No      Mobility  Bed Mobility Overal bed mobility: Needs Assistance Bed Mobility: Supine to Sit     Supine to sit: Supervision     General bed mobility comments: increased time,  use of bed rail  Transfers Overall transfer level: Needs assistance Equipment used: None Transfers: Sit to/from Stand Sit to Stand: Min guard         General transfer comment: pt guarded due to poor vision, pt bracing self via back of legs on bed and holding hands up for balance  Ambulation/Gait Ambulation/Gait assistance: Min guard Ambulation Distance (Feet): 120 Feet Assistive device:  (pushed IV pole) Gait Pattern/deviations: Step-through pattern;Decreased stride length (decreased step height) Gait velocity: slow and guarded Gait velocity interpretation: Below normal speed for age/gender General Gait Details: suggested pt use a cane due to strong desire to hold onto IV pole however pt reports "i don't need one of those things"  pt then realized the inability to see anything on his left side. discussed increased risk of falls and safety issues with poor vision deficits and how and AD can assist with minimizing falls risk. Pt then agreed to use RW. Pt to trial RW next session Pt with + SOB s/p 60'. SpO2 at 95%.  Stairs            Wheelchair Mobility    Modified Rankin (Stroke Patients Only) Modified Rankin (Stroke Patients Only) Pre-Morbid Rankin Score: No symptoms Modified Rankin: Moderately severe disability     Balance Overall balance assessment: Needs assistance Sitting-balance support: Feet supported;No upper extremity supported Sitting balance-Leahy Scale: Good     Standing balance support: Single extremity supported Standing balance-Leahy Scale: Poor Standing balance comment: requires assist for ambulation  Pertinent Vitals/Pain Pain Assessment: No/denies pain    Home Living Family/patient expects to be discharged to:: Private residence Living Arrangements: Spouse/significant other Available Help at Discharge: Family;Available 24 hours/day Type of Home: House Home Access: Stairs to enter   [employer]  of Steps: 1 Home Layout: Two level;Bed/bath upstairs Home Equipment: None      Prior Function Level of Independence: Independent         Comments: driving     Hand Dominance   Dominant Hand: Right    Extremity/Trunk Assessment   Upper Extremity Assessment: Overall WFL for tasks assessed           Lower Extremity Assessment: Overall WFL for tasks assessed (however bilat edema L worse than R)      Cervical / Trunk Assessment: Normal  Communication   Communication: No difficulties  Cognition Arousal/Alertness: Awake/alert Behavior During Therapy: WFL for tasks assessed/performed Overall Cognitive Status: Within Functional Limits for tasks assessed (L sided neglect but educated and pt aware he needs to turn )                      General Comments General comments (skin integrity, edema, etc.): L with +3 pitting edema, R LE with +2 pitting edema    Exercises        Assessment/Plan    PT Assessment Patient needs continued PT services  PT Diagnosis Difficulty walking;Generalized weakness   PT Problem List Decreased strength;Decreased range of motion;Decreased activity tolerance;Decreased balance;Decreased mobility;Decreased coordination;Decreased cognition;Decreased safety awareness  PT Treatment Interventions DME instruction;Gait training;Stair training;Functional mobility training;Therapeutic activities;Therapeutic exercise;Neuromuscular re-education;Balance training;Cognitive remediation;Patient/family education   PT Goals (Current goals can be found in the Care Plan section) Acute Rehab PT Goals Patient Stated Goal: get out ASAP and figure out what cancer i have PT Goal Formulation: With patient Time For Goal Achievement: 05/21/16 Potential to Achieve Goals: Good    Frequency Min 4X/week   Barriers to discharge        Co-evaluation               End of Session Equipment Utilized During Treatment: Gait belt Activity Tolerance: Patient  tolerated treatment well Patient left: in chair;with call bell/phone within reach;with chair alarm set Nurse Communication: Mobility status         Time: 0710-0743 PT Time Calculation (min) (ACUTE ONLY): 33 min   Charges:   PT Evaluation $PT Eval Moderate Complexity: 1 Procedure PT Treatments $Gait Training: 8-22 mins   PT G CodesKingsley Callander 05/14/2016, 7:58 AM   Kittie Plater, PT, DPT Pager #: [phone] Office #: [phone]

## 2016-05-14 NOTE — Progress Notes (Signed)
STROKE TEAM PROGRESS NOTE   HISTORY OF PRESENT ILLNESS (per record) Henry Ford is an 81 y.o. male with a history of hypertension, peripheral vascular disease, GI hemorrhage and recently diagnosed lung cancer, brought to the emergency room for evaluation of confusion and difficulty seeing on left side. Symptoms reportedly were first noted earlier today. Patient showed signs of confusion and also had difficulty working a Estate manager/land agent. He has no previous history of stroke nor TIA. He's been taking Eliquis for anticoagulation. CT scan of his head showed findings consistent with an acute right PCA territory ischemic infarction. No evidence of hemorrhage was seen. He was last known well at bedtime last night which was about 10:00 PM 05/12/2016. NIH stroke score was 4. Patient was not administered IV t-PA secondary to beyond time window for treatment consideration; on anticoagulation with Eliquis. He was admitted for further evaluation and treatment.   SUBJECTIVE (INTERVAL HISTORY) No family is at the bedside.  He is lying in the bed, alert and talkative. He recounted recent hx w/ Sethi as well as HPI. Overall he feels his condition is stable.    OBJECTIVE Temp:  [98.2 F (36.8 C)-99.1 F (37.3 C)] 98.4 F (36.9 C) (06/26 0939) Pulse Rate:  [60-71] 71 (06/26 0939) Cardiac Rhythm:  [-] Normal sinus rhythm (06/26 0754) Resp:  [12-20] 20 (06/26 0939) BP: (129-164)/(47-86) 158/70 mmHg (06/26 0939) SpO2:  [95 %-97 %] 97 % (06/26 0939)  CBC:   Recent Labs Lab 05/08/16 1154 05/13/16 2246  WBC 12.8* 13.2*  NEUTROABS 7808*  --   HGB 9.3* 8.5*  HCT 28.8* 26.7*  MCV 90.3 89.0  PLT 251 921    Basic Metabolic Panel:   Recent Labs Lab 05/08/16 1154 05/10/16 1301 05/13/16 2246  NA 142 143 140  K 4.3 4.3 4.0  CL 110  --  112*  CO2 18* 19* 20*  GLUCOSE 87 107 89  BUN 25 26.4* 21*  CREATININE 1.32* 1.3 1.43*  CALCIUM 8.3* 8.5 8.8*    Lipid Panel:     Component Value Date/Time   CHOL 152 05/14/2016 0216   TRIG 77 05/14/2016 0216   HDL 35* 05/14/2016 0216   CHOLHDL 4.3 05/14/2016 0216   VLDL 15 05/14/2016 0216   LDLCALC 102* 05/14/2016 0216   HgbA1c: No results found for: HGBA1C Urine Drug Screen: No results found for: LABOPIA, COCAINSCRNUR, LABBENZ, AMPHETMU, THCU, LABBARB    IMAGING  Ct Head Wo Contrast  05/13/2016  CLINICAL DATA:  81 year old male with vision loss and altered mental status EXAM: CT HEAD WITHOUT CONTRAST TECHNIQUE: Contiguous axial images were obtained from the base of the skull through the vertex without intravenous contrast. COMPARISON:  None. FINDINGS: There is no acute intracranial hemorrhage. There is mild to moderate age-related atrophy and chronic microvascular ischemic changes. There is a focal area of lower attenuation with loss of gray-white matter discrimination and effacement of the sulci in the right occipital lobe medially compatible with a right PCA territory infarct, likely acute or subacute. Clinical correlation is recommended. There is associated minimal mass effect on the adjacent brain parenchyma. No midline shift. There is mild mucoperiosteal thickening of paranasal sinuses with partial opacification of the ethmoid air cells. No air-fluid level. The mastoid air cells are clear. The calvarium is intact. IMPRESSION: No acute intracranial hemorrhage. Findings compatible with right PCA territory infarct, likely acute or subacute. Correlation with clinical exam recommended. MRI may provide better evaluation. These results were called by telephone at the time of interpretation  on 05/13/2016 at 11:28 pm to Dr. Wyvonnia Dusky, who verbally acknowledged these results. Electronically Signed   By: Anner Crete M.D.   On: 05/13/2016 23:33   2D Echocardiogram  - Left ventricle: The cavity size was mildly dilated. Systolic function was normal. The estimated ejection fraction was in the range of 55% to 60%. Wall motion was normal; there were no regional  wall motion abnormalities. Features are consistent with a pseudonormal left ventricular filling pattern, with concomitant abnormal relaxation and increased filling pressure (grade 2 diastolic dysfunction). Doppler parameters are consistent with elevated ventricular end-diastolic filling pressure. - Aortic valve: Trileaflet; mildly thickened, mildly calcified leaflets. There was mild regurgitation. - Aortic root: The aortic root was normal in size. - Mitral valve: Structurally normal valve. - Left atrium: The atrium was moderately dilated. - Right ventricle: Systolic function was normal. - Right atrium: The atrium was mildly dilated. - Tricuspid valve: There was mild regurgitation. - Pulmonic valve: There was trivial regurgitation. - Pulmonary arteries: Systolic pressure was within the normal range. - Inferior vena cava: The vessel was dilated. The respirophasic diameter changes were blunted (< 50%), consistent with elevated central venous pressure. - Pericardium, extracardiac: There was no pericardial effusion.  Carotid Duplex Completed. Right ICA demonstrated a moderate amount of calcified plaque suggestive of a 60-79% stenosis. The left ICA demonstrated mild plaque with a 1-39% stenosis. Vertebral arteries demonstrated antegrade flow.  PHYSICAL EXAM Pleasant elderly  Caucasian male currently not in distress.   . Afebrile. Head is nontraumatic. Neck is supple without bruit.    Cardiac exam no murmur or gallop. Lungs are clear to auscultation. Distal pulses are well felt. Neurological Exam ;  Awake alert oriented to time place and person. No dysarthria or aphasia. Follows commands well. Pupils equal reactive. Extraocular moments full range without nystagmus. Fundi were not visualized. Dense left homonymous hemianopsia. Face is symmetric without weakness. Tongue is midline. Motor system exam revealed no upper or lower extremity drift. Symmetric and equal strength in all 4 extremities. No focal  weakness. Sensation is intact. Gait was not tested. ASSESSMENT/PLAN Mr. Henry Ford is a 81 y.o. male with history of HTN, PVD, GIB and recently dx lung cancer presenting with new onset L visual field defect. He did not receive IV t-PA due to delay in arrival.   Stroke:  Likely R brain stroke, stroke up pending. likely embolic secondary to hypercoagulable state from recent lung cancer in setting of B LE DVTs  Resultant  L field cut  MRI  Pending  MRA  pending   Carotid Doppler  R 60-79% stenosis  2D Echo  EF 55-60%. No source of embolus   LDL 102  HgbA1c pending  eliquis for VTE prophylaxis Diet Heart Room service appropriate?: Yes; Fluid consistency:: Thin  Eliquis (apixaban) daily prior to admission, now on Eliquis (apixaban) daily  Patient counseled to be compliant with his antithrombotic medications  Ongoing aggressive stroke risk factor management  Therapy recommendations:  HH PT  Disposition:  Return home  DVT/PE  Due to hypercoagulability from lung cancer  on eliquis  Hypertension  Stable  Permissive hypertension (OK if < 220/120) but gradually normalize in 5-7 days  Long-term BP goal normotensive  Hyperlipidemia  Home meds:  No statin  LDL 102, goal < 70  Added lipitor 10  Continue statin at discharge  Other Stroke Risk Factors  Advanced age  Cigarette smoker, quit 3 months ago, encouraged to continue smoking cessation  ETOH use, advised to drink no more  than 2 drink(s) a day  PVD  Other Active Problems  Bronchogenic carcinoma, recent dx  Protein caolorie malnutirion  CKD sgate III  BPH  normocytoic [employer] day # 0  Radene Journey [facility] for Pager information 05/14/2016 4:06 PM  I have personally examined this patient, reviewed notes, independently viewed imaging studies, participated in medical decision making and plan of care. I have made any additions or clarifications directly to the  above note. Agree with note above. He presented with left-sided vision difficulties due to right posterior cerebral artery infarct likely of hypercoagulability etiology from lung cancer despite being on anticoagulation with eliquis. He remains at risk for neurological worsening, recurrent stroke, TIA needs ongoing stroke evaluation and aggressive risk factor modification. He may benefit by changing to warfarin. Greater than 50% time during this 25 minute visit was spent on counseling and coordination of care about stroke risk, prevention and treatment  Antony Contras, MD Medical Director [place] Pager: [phone] 05/14/2016 5:39 PM    To contact Stroke Continuity provider, please refer to [email or web address] After hours, contact General Neurology

## 2016-05-14 NOTE — Consult Note (Signed)
Referral MD  Reason for Referral: Bronchogenic carcinoma-locally advanced/metastatic; pulmonary embolism/bilateral lower extremity DVTs; new onset right posterior cerebral artery infarct   Chief Complaint  Patient presents with  . Altered Mental Status  . Eye Problem  : I had a stroke that probably to the hospital.  HPI: Henry Ford is a nice 81 year old white male. I just saw him on June 22. He presented with bilateral pleural nodules and a right upper lobe nodule. He had a precarinal mass. He also had pulmonary emboli and bilateral lower extremity DVTs. He was on ELIQUIS.  He is scheduled for EBUS this Wednesday.  He came in over the weekend with some confusion. It sounds like he had a stroke. He was on his anticoagulation. He had an infarct in the right posterior cerebral artery territory.  Today, he seems relatively intact. He is on ELIQUIS. He is going for MRI of the brain.  I think he had been on Plavix. This was stopped.  He has been seen by critical care. He's been seen by neurology.  I saw him, his prealbumin was only 15.  Overall, his performance status is not that good. I would say performance status is ECOG 3. He is not that hungry. He is not eating much. He's had no bleeding. He's had no nausea or vomiting. He denies any increased shortness of breath. He still has some leg swelling.     Past Medical History  Diagnosis Date  . Hypertension   . Prostate enlargement   . Peripheral vascular disease ([place]) 12/2015    non-healing ulcer right Hallux. S/P angioplasty and stenting of right common iliac 01/02/2016.  . Right foot pain 12/30/2015    Admitted to [facility] 12/11/2015, discharge 12/14/2015, diagnoses included acute right great toe infection/cellulitis, peripheral vascular disease. ABI demonstrated arterial occlusive disease worse on left. Is referred outpatient to vascular The CT scan documents for copy of his discharge summary. He should follow up with Dr. Ladona Horns,  general surgery at [facility]. No dated 02/06/2  . Anemia 12/2015  . Colon polyps ~ 2001    unknown pathology, colonoscopy done at outside hospital.   . Heme positive stool 12/2015  . Gastrointestinal hemorrhage with melena     Status post EGD and blood transfusion March 2017   . Ischemic foot ulcer due to atherosclerosis of native artery of limb ([place]) 01/10/2016  . Cancer [facility])     lung  :  Past Surgical History  Procedure Laterality Date  . Hydrocele excision  1956  . Peripheral vascular catheterization N/A 01/02/2016    Procedure: Abdominal Aortogram w/Lower Extremity;  Surgeon: Angelia Mould, MD;  Location: [place] [facility];  Service: Cardiovascular;  Laterality: N/A;  . Peripheral vascular catheterization  01/02/2016    Procedure: Peripheral Vascular Intervention;  Surgeon: Angelia Mould, MD;  Location: [place] [facility];  Service: Cardiovascular;;  . Esophagogastroduodenoscopy N/A 01/21/2016    Procedure: ESOPHAGOGASTRODUODENOSCOPY (EGD);  Surgeon: Irene Shipper, MD;  Location: [facility];  Service: Endoscopy;  Laterality: N/A;  :   Current facility-administered medications:  .  apixaban (ELIQUIS) tablet 10 mg, 10 mg, Oral, BID, Ilene Qua Opyd, MD, 10 mg at 05/14/16 1039 .  [START ON 05/15/2016] apixaban (ELIQUIS) tablet 5 mg, 5 mg, Oral, BID, Timothy S Opyd, MD .  atorvastatin (LIPITOR) tablet 10 mg, 10 mg, Oral, q1800, Donzetta Starch, NP, 10 mg at 05/14/16 1844 .  feeding supplement (ENSURE ENLIVE) (ENSURE ENLIVE) liquid 237 mL, 237 mL,  Oral, BID BM, Orson Eva, MD, 237 mL at 05/14/16 1400 .  ferrous sulfate tablet 325 mg, 325 mg, Oral, BID WC, Ilene Qua Opyd, MD, 325 mg at 05/14/16 1844 .  [START ON 05/15/2016] finasteride (PROSCAR) tablet 5 mg, 5 mg, Oral, Daily, Shanon Brow Tat, MD .  multivitamin with minerals tablet 1 tablet, 1 tablet, Oral, Daily, Vianne Bulls, MD, 1 tablet at 05/14/16 1039 .  omega-3 acid ethyl esters (LOVAZA) capsule 1,000 mg,  1,000 mg, Oral, Daily, Ilene Qua Opyd, MD, 1,000 mg at 05/14/16 1040 .  pantoprazole (PROTONIX) EC tablet 40 mg, 40 mg, Oral, Daily, Ilene Qua Opyd, MD, 40 mg at 05/14/16 1039 .  senna-docusate (Senokot-S) tablet 1 tablet, 1 tablet, Oral, QHS PRN, Ilene Qua Opyd, MD .  terazosin (HYTRIN) capsule 10 mg, 10 mg, Oral, QHS, Ilene Qua Opyd, MD, 10 mg at 05/14/16 0534 .  vitamin B-12 (CYANOCOBALAMIN) tablet 1,000 mcg, 1,000 mcg, Oral, Daily, Ilene Qua Opyd, MD, 1,000 mcg at 05/14/16 1040:  . apixaban  10 mg Oral BID  . [START ON 05/15/2016] apixaban  5 mg Oral BID  . atorvastatin  10 mg Oral q1800  . feeding supplement (ENSURE ENLIVE)  237 mL Oral BID BM  . ferrous sulfate  325 mg Oral BID WC  . [START ON 05/15/2016] finasteride  5 mg Oral Daily  . multivitamin with minerals  1 tablet Oral Daily  . omega-3 acid ethyl esters  1,000 mg Oral Daily  . pantoprazole  40 mg Oral Daily  . terazosin  10 mg Oral QHS  . vitamin B-12  1,000 mcg Oral Daily  :  No Known Allergies:  Family History  Problem Relation Age of Onset  . Colon cancer Neg Hx   . Diabetes Brother   :  Social History   Social History  . Marital Status: Married    Spouse Name: Henry Ford  . Number of Children: 2  . Years of Education: N/A   Occupational History  . retired    Social History Main Topics  . Smoking status: Former Smoker -- 0.25 packs/day for 60 years    Types: Cigarettes    Start date: 07/03/1950    Quit date: 01/20/2016  . Smokeless tobacco: Never Used  . Alcohol Use: 6.0 oz/week    10 Cans of beer, 0 Standard drinks or equivalent per week     Comment: beer  . Drug Use: No  . Sexual Activity: Not on file   Other Topics Concern  . Not on file   Social History Narrative  :  Pertinent items are noted in HPI.  Exam: Patient Vitals for the past 24 hrs:  BP Temp Temp src Pulse Resp SpO2  05/14/16 1653 130/65 mmHg 98.9 F (37.2 C) Oral 67 16 97 %  05/14/16 1342 (!) 141/60 mmHg 98.8 F (37.1 C) Oral  68 16 98 %  05/14/16 0939 (!) 158/70 mmHg 98.4 F (36.9 C) Oral 71 20 97 %  05/14/16 0530 (!) 148/55 mmHg 98.9 F (37.2 C) Oral 60 16 96 %  05/14/16 0330 (!) 129/47 mmHg 99.1 F (37.3 C) Oral 62 16 96 %  05/14/16 0130 (!) 145/68 mmHg 99 F (37.2 C) Oral 71 16 96 %  05/14/16 0030 153/86 mmHg - - 70 18 97 %  05/13/16 2215 140/64 mmHg - - 65 12 97 %  05/13/16 2202 164/63 mmHg 98.2 F (36.8 C) Oral 70 20 95 %  As above  Recent Labs  05/13/16 2246  WBC 13.2*  HGB 8.5*  HCT 26.7*  PLT 283    Recent Labs  05/13/16 2246  NA 140  K 4.0  CL 112*  CO2 20*  GLUCOSE 89  BUN 21*  CREATININE 1.43*  CALCIUM 8.8*    Blood smear review:  None   Pathology: None     Assessment and Plan:  Henry Ford is a 81 year old white male. He obviously is hypercoagulable because his malignancy. He had poor emboli and bilateral lower extremity DVT. He was on ELIQUIS. He now has a thrombus in the posterior cerebral artery territory.  My sense, is that even if we identify his malignancy, I'm not sure if he is able to take any therapy for it. Our best option would be for this be an adenocarcinoma and that there is some genetic driver that we can target. This would be less than 5%.  Currently, he seems to be doing fairly well.  His hemoglobin is quite low. I suspect he may have some iron deficiency in addition to some renal insufficiency. 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 He is definitely not a candidate for ESA administration.  This is a really tough situation. The fact that he was already on ELIQUIS and developed a cerebral thrombus is a really ominous factor my opinion. I think that he may have to be placed on Lovenox and taken off ELIQUIS. I think this would probably be the safest way to go and effective way to try to help with his hypercoagulable state.  Not sure if he is going to have a biopsy this week. However, I probably would get him on heparin. This way, if a biopsy is going to be  done, then the heparin can be stopped easily.  Overall, the outlook for Henry Ford is not good. He has a lot of negative factors against him already.  We really may have to consider talking to him about palliative care and hospice.  He would be nice to at least get a biopsy to see what we are dealing with. If, for some reason, this is small cell lung cancer, then we definitely have a chance of improving his quality of life and quantity of life.  I hate that he in the hospital. I know that he is getting fantastic care. And I appreciate everybody's help.  Frederich Cha 1:5-7

## 2016-05-14 NOTE — Progress Notes (Addendum)
ANTICOAGULATION CONSULT NOTE - Initial Consult  Pharmacy Consult for heparin Indication: PE/DVT  No Known Allergies  Patient Measurements:   Heparin Dosing Weight: 70.9 kg  Vital Signs: Temp: 98.9 F (37.2 C) (06/26 1653) Temp Source: Oral (06/26 1653) BP: 130/65 mmHg (06/26 1653) Pulse Rate: 67 (06/26 1653)  Labs:  Recent Labs  05/13/16 2246  HGB 8.5*  HCT 26.7*  PLT 283  LABPROT 23.6*  INR 2.12*  CREATININE 1.43*    Estimated Creatinine Clearance: 41.2 mL/min (by C-G formula based on Cr of 1.43).   Medical History: Past Medical History  Diagnosis Date  . Hypertension   . Prostate enlargement   . Peripheral vascular disease ([place]) 12/2015    non-healing ulcer right Hallux. S/P angioplasty and stenting of right common iliac 01/02/2016.  . Right foot pain 12/30/2015    Admitted to [facility] 12/11/2015, discharge 12/14/2015, diagnoses included acute right great toe infection/cellulitis, peripheral vascular disease. ABI demonstrated arterial occlusive disease worse on left. Is referred outpatient to vascular The CT scan documents for copy of his discharge summary. He should follow up with Dr. Ladona Horns, general surgery at [facility]. No dated 02/06/2  . Anemia 12/2015  . Colon polyps ~ 2001    unknown pathology, colonoscopy done at outside hospital.   . Heme positive stool 12/2015  . Gastrointestinal hemorrhage with melena     Status post EGD and blood transfusion March 2017   . Ischemic foot ulcer due to atherosclerosis of native artery of limb ([place]) 01/10/2016  . Cancer ([place])     lung    Medications:  Scheduled:  . atorvastatin  10 mg Oral q1800  . feeding supplement (ENSURE ENLIVE)  237 mL Oral BID BM  . [START ON 05/15/2016] finasteride  5 mg Oral Daily  . multivitamin with minerals  1 tablet Oral Daily  . omega-3 acid ethyl esters  1,000 mg Oral Daily  . pantoprazole  40 mg Oral Daily  . terazosin  10 mg Oral QHS  . vitamin B-12  1,000 mcg Oral  Daily   Infusions:    Assessment: 81 yo male with DVT/PE will be transitioned from apixaban to heparin.  Last apixaban dose was at 1039 on 05/14/16. Awaiting biopsy.  Baseline heparin level is >2.2.  Goal of Therapy:  Heparin level 0.3-0.5  aPTT 66-85 Monitor platelets by anticoagulation protocol: Yes   Plan:  - Start heparin at 1000 units/hr at 2300 tonight. No bolus - 8 hr heparin level and aPTT - daily heparin level, aPTT and CBC  Trashawn Oquendo, Tsz-Yin 05/14/2016,7:20 PM

## 2016-05-14 NOTE — Progress Notes (Signed)
  Echocardiogram 2D Echocardiogram has been performed.  [place] 05/14/2016, 10:14 AM

## 2016-05-15 ENCOUNTER — Inpatient Hospital Stay (HOSPITAL_COMMUNITY)
Admission: RE | Admit: 2016-05-15 | Payer: Commercial Managed Care - HMO | Source: Ambulatory Visit | Admitting: Emergency Medicine

## 2016-05-15 ENCOUNTER — Inpatient Hospital Stay (HOSPITAL_COMMUNITY): Payer: Commercial Managed Care - HMO

## 2016-05-15 DIAGNOSIS — R609 Edema, unspecified: Secondary | ICD-10-CM

## 2016-05-15 DIAGNOSIS — I2609 Other pulmonary embolism with acute cor pulmonale: Secondary | ICD-10-CM

## 2016-05-15 LAB — BASIC METABOLIC PANEL
Anion gap: 11 (ref 5–15)
BUN: 16 mg/dL (ref 6–20)
CO2: 20 mmol/L — ABNORMAL LOW (ref 22–32)
CREATININE: 1.38 mg/dL — AB (ref 0.61–1.24)
Calcium: 8.9 mg/dL (ref 8.9–10.3)
Chloride: 108 mmol/L (ref 101–111)
GFR calc Af Amer: 55 mL/min — ABNORMAL LOW (ref >60)
GFR, EST NON AFRICAN AMERICAN: 47 mL/min — AB (ref >60)
GLUCOSE: 101 mg/dL — AB (ref 65–99)
Potassium: 4 mmol/L (ref 3.5–5.1)
SODIUM: 139 mmol/L (ref 135–145)

## 2016-05-15 LAB — CBC
HCT: 29.7 % — ABNORMAL LOW (ref 39.0–52.0)
Hemoglobin: 9.7 g/dL — ABNORMAL LOW (ref 13.0–17.0)
MCH: 29.5 pg (ref 26.0–34.0)
MCHC: 32.7 g/dL (ref 30.0–36.0)
MCV: 90.3 fL (ref 78.0–100.0)
PLATELETS: 312 10*3/uL (ref 150–400)
RBC: 3.29 MIL/uL — ABNORMAL LOW (ref 4.22–5.81)
RDW: 14.8 % (ref 11.5–15.5)
WBC: 14.2 10*3/uL — AB (ref 4.0–10.5)

## 2016-05-15 LAB — VAS US CAROTID
LCCADSYS: 75 cm/s
LCCAPSYS: 82 cm/s
LEFT VERTEBRAL DIAS: 15 cm/s
Left CCA dist dias: 16 cm/s
Left CCA prox dias: 17 cm/s
Left ICA prox dias: -26 cm/s
Left ICA prox sys: -110 cm/s
RCCADSYS: -79 cm/s
RCCAPDIAS: 20 cm/s
RCCAPSYS: 113 cm/s
RIGHT VERTEBRAL DIAS: 17 cm/s

## 2016-05-15 LAB — HEMOGLOBIN A1C
HEMOGLOBIN A1C: 5.4 % (ref 4.8–5.6)
MEAN PLASMA GLUCOSE: 108 mg/dL

## 2016-05-15 LAB — APTT: APTT: 113 s — AB (ref 24–37)

## 2016-05-15 LAB — VITAMIN B12: Vitamin B-12: 805 pg/mL (ref 180–914)

## 2016-05-15 LAB — HEPARIN LEVEL (UNFRACTIONATED)

## 2016-05-15 MED ORDER — ENOXAPARIN SODIUM 80 MG/0.8ML ~~LOC~~ SOLN
70.0000 mg | Freq: Two times a day (BID) | SUBCUTANEOUS | Status: DC
  Administered 2016-05-15 – 2016-05-16 (×2): 70 mg via SUBCUTANEOUS
  Filled 2016-05-15 (×2): qty 0.8

## 2016-05-15 MED ORDER — ENOXAPARIN SODIUM 100 MG/ML ~~LOC~~ SOLN: 100.0000 mg | Freq: Two times a day (BID) | SUBCUTANEOUS | Status: DC

## 2016-05-15 MED ORDER — ATORVASTATIN CALCIUM 10 MG PO TABS: 10.0000 mg | ORAL_TABLET | Freq: Every day | ORAL | Status: DC

## 2016-05-15 NOTE — Discharge Summary (Addendum)
Physician Discharge Summary  Henry Ford QMG:[id number] DOB: [birth year] DOA: 05/13/2016  PCP: [name], DO  Admit date: 05/13/2016 Discharge date: 05/15/2016  Admitted From: Home Disposition:  Home  Recommendations for Outpatient Follow-up:  1. Follow up with PCP in 1-2 weeks 2. Please obtain BMP/CBC in one week   Home Health:PT/OT/SLP Equipment/Devices:none  Discharge Condition: Stable CODE STATUS: FULL Diet recommendation: Heart Healthy  Brief/Interim Summary: 81 year old male with a history of hypertension, peripheral vascular disease status post right iliac stent, DVT of LE, PE, and newly diagnosed bronchogenic carcinoma on 05/07/16. His lung cancer was clinically diagnosed on the CTA of the chest that was obtained on 05/07/16 when he had an incidental finding of right upper lobe spiculated lesion measuring 3.2 x 2.68 m with numerous other pulmonary nodules with associated mediastinal lymph adenopathy. The patient was started on apixaban on 05/08/2016. Venous duplex of the lower extremity showed bilateral DVT on 05/08/2016. The patient was seen by medical oncology, Dr. Marin Olp on 05/10/2016 whom recommended outpatient PET scan, MR brain and tissue biopsy. The patient was set up to see Dr. Lamonte Sakai for possible bronchoscopy with bx. Unfortunately, the patient developed a two-day history of left visual field deficits and headache associated with confusion and lethargy. A result, the patient was brought to emergency department on 05/13/2016 for further evaluation. CT of the brain showed an acute versus subacute right PCA infarct.The pt was admitted for full stroke work up and Neurology was consulted  Discharge Diagnoses:  Right PCA Stroke -Neurology Consult -PT/OT evaluation--home health PT -Speech therapy eval -CT brain--acute versus subacute right PCA infarct -MRI-large R-PCA infarct; Also infarcts involving bilateral cerebella, bilateral frontal and L-occipital; trace  R-SDH likely due to embolic phenomena; no mets -MRA brain-R-PCA occlusion  -Carotid Duplex--Right ICA demonstrated a moderate amount of calcified plaque suggestive of a 60-79% stenosis;  L-ICA 1-39 % -Echo--EF 55-60%, grade 2 DD, no WMA, no emboli -LDL--102--Lipitor 10 mg daily started -HbA1C--5.4 -apixaban switched to IV heparin (6/26) -IV heparin switched to [place] Lovenox (6/27) since bronchoscopy cancelled -05/15/16 case discussed with neurology--since bronchoscopy cancelled-->pt okay to go home  DVT bilateral legs, PE  - PE Diagnosed 05/07/16 - Likely hypercoagulable state d/t newly identified lung ca  - continue apixaban--monitor Hgb -05/13/2016 EKG--personally reviewed-sinus rhythm, nonspecific ST changes -05/15/16-leg duplex--bilateral extensive DVT--previously known from duplex on 05/08/16 -home with [place] Lovenox as per Dr. Sydnee Cabal to send home with once daily dosing for convenience  Bronchogenic carcinoma  - Identified on CT from 6/19 with apparent primary in RUL and mediastinal adenopathy  - Has established with oncology, Dr. Marin Olp, and has been referred for tissue biopsy  - Ongoing management per oncology and pulmonology -05/15/16-Bronchoscopy CANCELLED by pulmonary--to delay until outpt setting -follow up with pulmonary after d/c  Acute metabolic encephalopathy -due to embolic stroke -improved -wife states pt is at baseline 6/27  Moderate Protein-calorie malnutrition  - Serum albumin 2.8; patient appears cachectic with bitemporal wasting  - Dietary consultation requested-->continue ensure  CKD stage III  - SCr 1.43 at time of admission, consistent with his apparent baseline  - Avoid nephrotoxins, renally-dose medications  -baseline 1.3-1.5 -stable  HTN -stable -continue Hytrin -d/c losartan in setting of CKD -BP remains stable and acceptable off losartan during the hospitalization -f/u PCP for future adjustments.  BPH  - Stable, continue current  management with finasteride and terazosin   Normocytic anemia  - Hgb 8.5 on admission, slightly down from recent priors in 9-10 range  - No obvious bleeding; pt  denies melena or hematochezia  - Monitor with periodic CBC--Hgb stable  CODE STATUS -confirmed with pt and wife-->DNR   Discharge Instructions     Medication List    STOP taking these medications        apixaban 5 MG Tabs tablet  Commonly known as:  ELIQUIS     losartan 100 MG tablet  Commonly known as:  COZAAR      TAKE these medications        atorvastatin 10 MG tablet  Commonly known as:  LIPITOR  Take 1 tablet (10 mg total) by mouth daily at 6 PM.     co-enzyme Q-10 30 MG capsule  Take 30 mg by mouth daily.     enoxaparin 100 MG/ML injection  Commonly known as:  LOVENOX  Inject 1 mL (100 mg total) into the skin every 12 (twelve) hours.     ferrous sulfate 325 (65 FE) MG tablet  Take 325 mg by mouth 2 (two) times daily with a meal.     finasteride 5 MG tablet  Commonly known as:  PROSCAR  Take 5 mg by mouth daily. Reported on 02/01/2016     multivitamin tablet  Take 1 tablet by mouth daily.     Omega 3 1000 MG Caps  Take 1,000 mg by mouth daily.     pantoprazole 40 MG tablet  Commonly known as:  PROTONIX  Take 40 mg by mouth daily.     Red Yeast Rice 600 MG Caps  Take 600 mg by mouth daily.     terazosin 10 MG capsule  Commonly known as:  HYTRIN  Take 10 mg by mouth at bedtime. Reported on 01/20/2016     vitamin B-12 1000 MCG tablet  Commonly known as:  CYANOCOBALAMIN  Take 1,000 mcg by mouth daily.     vitamin C 500 MG tablet  Commonly known as:  ASCORBIC ACID  Take 1,000 mg by mouth daily.        No Known Allergies  Consultations:  Pulmonary  Neurology-Sethi  Med Onc--Ennever   Procedures/Studies: Dg Chest 2 View  05/14/2016  CLINICAL DATA:  Follow-up stroke. History of metastatic lung cancer. EXAM: CHEST  2 VIEW COMPARISON:  Chest CT May 07, 2016 DINGS: Cardiac  silhouette is normal and unchanged. Calcified aortic knob. Spiculated RIGHT hilar mass better seen on recent CT with scattered pulmonary nodules/metastasis. Scarring LEFT lung base. Increased lung volumes. No pleural effusion. No pneumothorax. Soft tissue planes and included osseous structures are nonsuspicious. Moderate chronic approximate T12 compression fracture. IMPRESSION: RIGHT hilar mass corresponding to known cancer with multiple pulmonary nodules/metastasis. COPD, no superimposed acute cardiopulmonary process. Electronically Signed   By: Elon Alas M.D.   On: 05/14/2016 22:08   Dg Chest 2 View  05/03/2016  CLINICAL DATA:  Shortness of breath since stent placement in February of 2017. EXAM: CHEST  2 VIEW COMPARISON:  None. FINDINGS: Heart size is upper normal. Lungs are hyperexpanded. There is a masslike density within the suprahilar right lung, measuring approximately 3 cm greatest dimension. Smaller nodular densities are seen along the periphery of the left lung there is probable scarring and/or small effusion at the left lung base. There is a compression fracture deformity at the thoracolumbar junction which is of uncertain age. IMPRESSION: 1. Masslike density within the right upper lung, suprahilar region, measuring approximately 3 cm greatest dimension. There are no prior studies available for comparison. This is concerning for neoplastic mass. Recommend chest CT for further characterization.  2. Additional smaller nodular densities along the periphery of the left lung. These are also suspicious for neoplastic nodules. 3. Hyperexpanded lungs indicating COPD/emphysema. 4. Compression fracture deformity at the thoracolumbar junction, of uncertain age but favored to be chronic. This also could be further characterized with CT. These results will be called to the ordering clinician or representative by the Radiologist Assistant, and communication documented in the PACS or zVision Dashboard.  Electronically Signed   By: Franki Cabot M.D.   On: 05/03/2016 17:05   Ct Head Wo Contrast  05/13/2016  CLINICAL DATA:  81 year old male with vision loss and altered mental status EXAM: CT HEAD WITHOUT CONTRAST TECHNIQUE: Contiguous axial images were obtained from the base of the skull through the vertex without intravenous contrast. COMPARISON:  None. FINDINGS: There is no acute intracranial hemorrhage. There is mild to moderate age-related atrophy and chronic microvascular ischemic changes. There is a focal area of lower attenuation with loss of gray-white matter discrimination and effacement of the sulci in the right occipital lobe medially compatible with a right PCA territory infarct, likely acute or subacute. Clinical correlation is recommended. There is associated minimal mass effect on the adjacent brain parenchyma. No midline shift. There is mild mucoperiosteal thickening of paranasal sinuses with partial opacification of the ethmoid air cells. No air-fluid level. The mastoid air cells are clear. The calvarium is intact. IMPRESSION: No acute intracranial hemorrhage. Findings compatible with right PCA territory infarct, likely acute or subacute. Correlation with clinical exam recommended. MRI may provide better evaluation. These results were called by telephone at the time of interpretation on 05/13/2016 at 11:28 pm to Dr. Wyvonnia Dusky, who verbally acknowledged these results. Electronically Signed   By: Anner Crete M.D.   On: 05/13/2016 23:33   Ct Chest W Contrast  05/11/2016  ADDENDUM REPORT: 05/11/2016 15:34 ADDENDUM: Post processing for super D guidance was requested on 05/11/2016. The corresponding images were added to this examination. Electronically Signed   By: Richardean Sale M.D.   On: 05/11/2016 15:34  05/11/2016  CLINICAL DATA:  Shortness of breath with difficulty breathing and weight loss. Abnormal chest x-ray with findings worrisome for neoplasm. No given history of malignancy. EXAM: CT  CHEST, ABDOMEN, AND PELVIS WITH CONTRAST TECHNIQUE: Multidetector CT imaging of the chest, abdomen and pelvis was performed following the standard protocol during bolus administration of intravenous contrast. CONTRAST:  57m ISOVUE-300 IOPAMIDOL (ISOVUE-300) INJECTION 61% COMPARISON:  Chest radiograph 05/03/2016. FINDINGS: CT CHEST Cardiovascular: The heart size is normal. There is no pericardial effusion. There is atherosclerosis of the aorta, great vessels and coronary arteries. Although not performed as a CTA, there is evidence of acute/subacute pulmonary embolism within the left lower lobe pulmonary artery. Mediastinum/Nodes: There are multiple enlarged mediastinal and right hilar lymph nodes. There is a dominant precarinal nodal mass measuring 3.9 x 2.4 cm on image 28. There is a right hilar node measuring up to 1.7 cm on image 32. The thyroid gland, trachea and esophagus demonstrate no significant findings. Lungs/Pleura: There is no pleural effusion. This dominant spiculated right upper lobe lesion measuring 3.2 x 2.6 cm on image 59, likely a primary lung cancer. There are numerous other pulmonary nodules bilaterally suspicious for metastatic disease. The largest of these other nodules measures 14 x 13 mm in the left lower lobe on image 121. There are multiple other smaller lesions, more than 10 in each lung. Musculoskeletal/Chest wall: No chest wall mass or suspicious osseous findings. There is a mild superior endplate compression deformity at  T8 which appears benign. CT ABDOMEN AND PELVIS FINDINGS Hepatobiliary: The liver demonstrates mild contour irregularity, but no focal abnormality. No evidence of gallstones, gallbladder wall thickening or biliary dilatation. Pancreas: Unremarkable. No pancreatic ductal dilatation or surrounding inflammatory changes. Spleen: Normal in size without focal abnormality. Adrenals/Urinary Tract: 15 mm left adrenal nodule measures 5 HU on the delayed images, likely an  incidental adenoma. The right adrenal gland appears normal. 5.1 cm lesion projecting from the lower pole of the left kidney measures slightly higher than water density (21 -23 HU), although is probably a cyst. There are other smaller low-density renal lesions which are probably cysts as well. No definite enhancing renal mass or hydronephrosis. There is no evidence of ureteral calculus. The bladder is heavily trabeculated. There is a calculus within a posterior bladder diverticulum. Stomach/Bowel: No evidence of bowel wall thickening, distention or surrounding inflammatory change. The stomach is decompressed. Vascular/Lymphatic: There are no enlarged abdominal or pelvic lymph nodes. There is extensive aortic and branch vessel atherosclerosis. There is nearly occlusive thrombus within the left femoral vein. This extends into the left external iliac vein. There is also nonocclusive thrombus in the right femoral vein. The IVC appears patent. Reproductive: There are extensive calcifications throughout the prostate gland. Other: No ascites or peritoneal nodularity. Musculoskeletal: No acute or significant osseous findings. There is a superior endplate compression deformity at L1 with an associated Schmorl's node which appears benign. Degenerative changes are present in the lower lumbar spine. IMPRESSION: 1. Findings are consistent with right upper lobe lung cancer with multiple enlarged mediastinal and right hilar lymph nodes as well as numerous bilateral pulmonary nodules consistent with metastases. 2. No definite evidence of metastatic disease within the abdomen or pelvis. 3. Bilateral DVT with thromboembolic disease (pulmonary embolism) within the left pulmonary artery. 4. Other incidental findings include the presence of extensive atherosclerosis, trabeculated bladder containing a calculus, probable incidental left adrenal adenoma, bilateral renal cysts, and chronic appearing thoracolumbar compression deformities. 5.  Critical Value/emergent results were called by telephone at the time of interpretation on 05/07/2016 at 4:38 pm to [facility] [place] for Dr. [name] , who verbally acknowledged these results. Electronically Signed: By: Richardean Sale M.D. On: 05/07/2016 16:39   Mr [name] RC Contrast  05/14/2016  CLINICAL DATA:  Confusion, LEFT vision changes. Follow-up acute stroke. History of locally metastatic bronchogenic carcinoma, hypercoagulable state, hypertension. EXAM: MRI HEAD WITHOUT AND WITH CONTRAST MRA HEAD WITHOUT CONTRAST TECHNIQUE: Multiplanar, multiecho pulse sequences of the brain and surrounding structures were obtained without and with intravenous contrast. Angiographic images of the head were obtained using MRA technique without contrast. CONTRAST:  27m MULTIHANCE GADOBENATE DIMEGLUMINE 529 MG/ML IV SOLN COMPARISON:  CT HEAD May 13, 2016 FINDINGS: MRI HEAD FINDINGS INTRACRANIAL CONTENTS: Confluent medial RIGHT temporal occipital reduced diffusion with low ADC values and intermediate to bright T2 FLAIR signal. Scattered subcentimeter foci of reduced diffusion LEFT occipital lobe, bilateral cerebellum in addition to 16 x 9 mm focus of reduced diffusion RIGHT cerebellum with low ADC value. At least 2 subcentimeter focus of reduced diffusion bifrontal lobes no susceptibility artifact is suggests intraparenchymal hemorrhage. Linear reduced diffusion RIGHT frontal extra-axial space with bright FLAIR signal and intrinsic T1 shortening. Ventricles and sulci are normal for patient's age. Minimal white matter changes most compatible with chronic small vessel ischemic disease are less than expected for age. No intraparenchymal masses, mass effect or abnormal enhancement. ORBITS: The included ocular globes and orbital contents are non-suspicious. SINUSES: The mastoid air-cells and included  paranasal sinuses are well-aerated. SKULL/SOFT TISSUES: No abnormal sellar expansion. No suspicious calvarial bone marrow  signal. Generalized bright T1 bone marrow signal most compatible with osteopenia. Craniocervical junction maintained. Sub cm probable lymph node RIGHT parotid gland. Patient is edentulous. MRA HEAD FINDINGS ANTERIOR CIRCULATION: Normal flow related enhancement of the included cervical, petrous, cavernous and supraclinoid internal carotid arteries. Patent anterior communicating artery. Normal flow related enhancement of the anterior and middle cerebral arteries, including distal segments. No large vessel occlusion, high-grade stenosis, abnormal luminal irregularity, aneurysm. POSTERIOR CIRCULATION: RIGHT vertebral artery is dominant. Basilar artery is patent, with normal flow related enhancement of the main branch vessels. Robust LEFT and small RIGHT posterior communicating arteries present. Occluded RIGHT proximal P2 segment. No high-grade stenosis, abnormal luminal irregularity, aneurysm. IMPRESSION: MRI HEAD: Acute large RIGHT posterior cerebral artery territory infarct (RIGHT temporal occipital lobe). Additional small acute infarcts bilateral cerebella, LEFT occipital lobe and bifrontal lobes. Findings most consistent with embolic phenomena. Trace subacute RIGHT frontal subdural hematoma. No MR findings of intracranial metastasis. MRA HEAD: RIGHT posterior cerebral artery occlusion (proximal P2 segment). Otherwise negative MRA head. These results will be called to the ordering clinician or representative by the Radiologist Assistant, and communication documented in the PACS or zVision Dashboard. Electronically Signed   By: Elon Alas M.D.   On: 05/14/2016 22:27   Ct Abdomen Pelvis W Contrast  05/11/2016  ADDENDUM REPORT: 05/11/2016 15:34 ADDENDUM: Post processing for super D guidance was requested on 05/11/2016. The corresponding images were added to this examination. Electronically Signed   By: Richardean Sale M.D.   On: 05/11/2016 15:34  05/11/2016  CLINICAL DATA:  Shortness of breath with difficulty  breathing and weight loss. Abnormal chest x-ray with findings worrisome for neoplasm. No given history of malignancy. EXAM: CT CHEST, ABDOMEN, AND PELVIS WITH CONTRAST TECHNIQUE: Multidetector CT imaging of the chest, abdomen and pelvis was performed following the standard protocol during bolus administration of intravenous contrast. CONTRAST:  59m ISOVUE-300 IOPAMIDOL (ISOVUE-300) INJECTION 61% COMPARISON:  Chest radiograph 05/03/2016. FINDINGS: CT CHEST Cardiovascular: The heart size is normal. There is no pericardial effusion. There is atherosclerosis of the aorta, great vessels and coronary arteries. Although not performed as a CTA, there is evidence of acute/subacute pulmonary embolism within the left lower lobe pulmonary artery. Mediastinum/Nodes: There are multiple enlarged mediastinal and right hilar lymph nodes. There is a dominant precarinal nodal mass measuring 3.9 x 2.4 cm on image 28. There is a right hilar node measuring up to 1.7 cm on image 32. The thyroid gland, trachea and esophagus demonstrate no significant findings. Lungs/Pleura: There is no pleural effusion. This dominant spiculated right upper lobe lesion measuring 3.2 x 2.6 cm on image 59, likely a primary lung cancer. There are numerous other pulmonary nodules bilaterally suspicious for metastatic disease. The largest of these other nodules measures 14 x 13 mm in the left lower lobe on image 121. There are multiple other smaller lesions, more than 10 in each lung. Musculoskeletal/Chest wall: No chest wall mass or suspicious osseous findings. There is a mild superior endplate compression deformity at T8 which appears benign. CT ABDOMEN AND PELVIS FINDINGS Hepatobiliary: The liver demonstrates mild contour irregularity, but no focal abnormality. No evidence of gallstones, gallbladder wall thickening or biliary dilatation. Pancreas: Unremarkable. No pancreatic ductal dilatation or surrounding inflammatory changes. Spleen: Normal in size  without focal abnormality. Adrenals/Urinary Tract: 15 mm left adrenal nodule measures 5 HU on the delayed images, likely an incidental adenoma. The right adrenal gland appears  normal. 5.1 cm lesion projecting from the lower pole of the left kidney measures slightly higher than water density (21 -23 HU), although is probably a cyst. There are other smaller low-density renal lesions which are probably cysts as well. No definite enhancing renal mass or hydronephrosis. There is no evidence of ureteral calculus. The bladder is heavily trabeculated. There is a calculus within a posterior bladder diverticulum. Stomach/Bowel: No evidence of bowel wall thickening, distention or surrounding inflammatory change. The stomach is decompressed. Vascular/Lymphatic: There are no enlarged abdominal or pelvic lymph nodes. There is extensive aortic and branch vessel atherosclerosis. There is nearly occlusive thrombus within the left femoral vein. This extends into the left external iliac vein. There is also nonocclusive thrombus in the right femoral vein. The IVC appears patent. Reproductive: There are extensive calcifications throughout the prostate gland. Other: No ascites or peritoneal nodularity. Musculoskeletal: No acute or significant osseous findings. There is a superior endplate compression deformity at L1 with an associated Schmorl's node which appears benign. Degenerative changes are present in the lower lumbar spine. IMPRESSION: 1. Findings are consistent with right upper lobe lung cancer with multiple enlarged mediastinal and right hilar lymph nodes as well as numerous bilateral pulmonary nodules consistent with metastases. 2. No definite evidence of metastatic disease within the abdomen or pelvis. 3. Bilateral DVT with thromboembolic disease (pulmonary embolism) within the left pulmonary artery. 4. Other incidental findings include the presence of extensive atherosclerosis, trabeculated bladder containing a calculus,  probable incidental left adrenal adenoma, bilateral renal cysts, and chronic appearing thoracolumbar compression deformities. 5. Critical Value/emergent results were called by telephone at the time of interpretation on 05/07/2016 at 4:38 pm to [facility] [place] for Dr. [name] , who verbally acknowledged these results. Electronically Signed: By: Richardean Sale M.D. On: 05/07/2016 16:39   US Venous Img Lower Bilateral  05/08/2016  CLINICAL DATA:  History of lung carcinoma. Thrombus seen in the femoral veins bilateral recent CT scan. EXAM: BILATERAL LOWER EXTREMITY VENOUS DOPPLER ULTRASOUND TECHNIQUE: Gray-scale sonography with graded compression, as well as color Doppler and duplex ultrasound were performed to evaluate the lower extremity deep venous systems from the level of the common femoral vein and including the common femoral, femoral, profunda femoral, popliteal and calf veins including the posterior tibial, peroneal and gastrocnemius veins when visible. The superficial great saphenous vein was also interrogated. Spectral Doppler was utilized to evaluate flow at rest and with distal augmentation maneuvers in the common femoral, femoral and popliteal veins. COMPARISON:  CT chest, abdomen and pelvis 05/07/2016. FINDINGS: RIGHT LOWER EXTREMITY Common Femoral Vein: Partially occlusive thrombus identified. Saphenofemoral Junction: Partially occlusive thrombus identified. Profunda Femoral Vein: Partially occlusive thrombus identified. Femoral Vein: Occlusive thrombus identified. Popliteal Vein: Occlusive thrombus identified. Calf Veins: Partially occlusive thrombus is seen in the posterior tibial vein. Peroneal vein is not visualized. Thrombus is also identified in the gastrocnemius vein. Lesser saphenous Vein: Thrombus identified. Other Findings:  None. LEFT LOWER EXTREMITY Common Femoral Vein: Occlusive thrombus identified. Saphenofemoral Junction: Occlusive thrombus identified. Profunda Femoral Vein:  Occlusive thrombus identified. Femoral Vein: Occlusive thrombus identified. Popliteal Vein: Occlusive thrombus identified. Calf Veins: Not visualized. Superficial Great Saphenous Vein: Not reported. Other Findings:  None. IMPRESSION: Extensive bilateral deep venous thrombosis. In the left leg, occlusive clot is seen from the common femoral through the popliteal vein. On the right, occlusive thrombus is seen from the femoral through the popliteal vein. Critical Value/emergent results were called by telephone at the time of interpretation on 05/08/2016 at 3:47 pm  to Dr. [name] , who verbally acknowledged these results. Electronically Signed   By: Inge Rise M.D.   On: 05/08/2016 15:49   Mr Jodene Nam Head/brain Wo Cm  05/14/2016  CLINICAL DATA:  Confusion, LEFT vision changes. Follow-up acute stroke. History of locally metastatic bronchogenic carcinoma, hypercoagulable state, hypertension. EXAM: MRI HEAD WITHOUT AND WITH CONTRAST MRA HEAD WITHOUT CONTRAST TECHNIQUE: Multiplanar, multiecho pulse sequences of the brain and surrounding structures were obtained without and with intravenous contrast. Angiographic images of the head were obtained using MRA technique without contrast. CONTRAST:  43m MULTIHANCE GADOBENATE DIMEGLUMINE 529 MG/ML IV SOLN COMPARISON:  CT HEAD May 13, 2016 FINDINGS: MRI HEAD FINDINGS INTRACRANIAL CONTENTS: Confluent medial RIGHT temporal occipital reduced diffusion with low ADC values and intermediate to bright T2 FLAIR signal. Scattered subcentimeter foci of reduced diffusion LEFT occipital lobe, bilateral cerebellum in addition to 16 x 9 mm focus of reduced diffusion RIGHT cerebellum with low ADC value. At least 2 subcentimeter focus of reduced diffusion bifrontal lobes no susceptibility artifact is suggests intraparenchymal hemorrhage. Linear reduced diffusion RIGHT frontal extra-axial space with bright FLAIR signal and intrinsic T1 shortening. Ventricles and sulci are normal for  patient's age. Minimal white matter changes most compatible with chronic small vessel ischemic disease are less than expected for age. No intraparenchymal masses, mass effect or abnormal enhancement. ORBITS: The included ocular globes and orbital contents are non-suspicious. SINUSES: The mastoid air-cells and included paranasal sinuses are well-aerated. SKULL/SOFT TISSUES: No abnormal sellar expansion. No suspicious calvarial bone marrow signal. Generalized bright T1 bone marrow signal most compatible with osteopenia. Craniocervical junction maintained. Sub cm probable lymph node RIGHT parotid gland. Patient is edentulous. MRA HEAD FINDINGS ANTERIOR CIRCULATION: Normal flow related enhancement of the included cervical, petrous, cavernous and supraclinoid internal carotid arteries. Patent anterior communicating artery. Normal flow related enhancement of the anterior and middle cerebral arteries, including distal segments. No large vessel occlusion, high-grade stenosis, abnormal luminal irregularity, aneurysm. POSTERIOR CIRCULATION: RIGHT vertebral artery is dominant. Basilar artery is patent, with normal flow related enhancement of the main branch vessels. Robust LEFT and small RIGHT posterior communicating arteries present. Occluded RIGHT proximal P2 segment. No high-grade stenosis, abnormal luminal irregularity, aneurysm. IMPRESSION: MRI HEAD: Acute large RIGHT posterior cerebral artery territory infarct (RIGHT temporal occipital lobe). Additional small acute infarcts bilateral cerebella, LEFT occipital lobe and bifrontal lobes. Findings most consistent with embolic phenomena. Trace subacute RIGHT frontal subdural hematoma. No MR findings of intracranial metastasis. MRA HEAD: RIGHT posterior cerebral artery occlusion (proximal P2 segment). Otherwise negative MRA head. These results will be called to the ordering clinician or representative by the Radiologist Assistant, and communication documented in the PACS or  zVision Dashboard. Electronically Signed   By: CElon AlasM.D.   On: 05/14/2016 22:27        Discharge Exam: Filed Vitals:   05/15/16 0606 05/15/16 0951  BP: 135/45 143/66  Pulse: 73 57  Temp: 98.2 F (36.8 C) 98.3 F (36.8 C)  Resp:  20   Filed Vitals:   05/14/16 2152 05/15/16 0206 05/15/16 0606 05/15/16 0951  BP: 151/61 122/39 135/45 143/66  Pulse: 74 72 73 57  Temp: 99 F (37.2 C) 99.6 F (37.6 C) 98.2 F (36.8 C) 98.3 F (36.8 C)  TempSrc: Oral Oral Oral Oral  Resp:    20  SpO2: 96% 95% 95% 97%    General: Pt is alert, awake, not in acute distress Cardiovascular: RRR, S1/S2 +, no rubs, no gallops Respiratory: CTA bilaterally, no wheezing,  no rhonchi Abdominal: Soft, NT, ND, bowel sounds + Extremities: no edema, no cyanosis   The results of significant diagnostics from this hospitalization (including imaging, microbiology, ancillary and laboratory) are listed below for reference.    Significant Diagnostic Studies: Dg Chest 2 View  05/14/2016  CLINICAL DATA:  Follow-up stroke. History of metastatic lung cancer. EXAM: CHEST  2 VIEW COMPARISON:  Chest CT May 07, 2016 DINGS: Cardiac silhouette is normal and unchanged. Calcified aortic knob. Spiculated RIGHT hilar mass better seen on recent CT with scattered pulmonary nodules/metastasis. Scarring LEFT lung base. Increased lung volumes. No pleural effusion. No pneumothorax. Soft tissue planes and included osseous structures are nonsuspicious. Moderate chronic approximate T12 compression fracture. IMPRESSION: RIGHT hilar mass corresponding to known cancer with multiple pulmonary nodules/metastasis. COPD, no superimposed acute cardiopulmonary process. Electronically Signed   By: Elon Alas M.D.   On: 05/14/2016 22:08   Dg Chest 2 View  05/03/2016  CLINICAL DATA:  Shortness of breath since stent placement in February of 2017. EXAM: CHEST  2 VIEW COMPARISON:  None. FINDINGS: Heart size is upper normal. Lungs are  hyperexpanded. There is a masslike density within the suprahilar right lung, measuring approximately 3 cm greatest dimension. Smaller nodular densities are seen along the periphery of the left lung there is probable scarring and/or small effusion at the left lung base. There is a compression fracture deformity at the thoracolumbar junction which is of uncertain age. IMPRESSION: 1. Masslike density within the right upper lung, suprahilar region, measuring approximately 3 cm greatest dimension. There are no prior studies available for comparison. This is concerning for neoplastic mass. Recommend chest CT for further characterization. 2. Additional smaller nodular densities along the periphery of the left lung. These are also suspicious for neoplastic nodules. 3. Hyperexpanded lungs indicating COPD/emphysema. 4. Compression fracture deformity at the thoracolumbar junction, of uncertain age but favored to be chronic. This also could be further characterized with CT. These results will be called to the ordering clinician or representative by the Radiologist Assistant, and communication documented in the PACS or zVision Dashboard. Electronically Signed   By: Franki Cabot M.D.   On: 05/03/2016 17:05   Ct Head Wo Contrast  05/13/2016  CLINICAL DATA:  81 year old male with vision loss and altered mental status EXAM: CT HEAD WITHOUT CONTRAST TECHNIQUE: Contiguous axial images were obtained from the base of the skull through the vertex without intravenous contrast. COMPARISON:  None. FINDINGS: There is no acute intracranial hemorrhage. There is mild to moderate age-related atrophy and chronic microvascular ischemic changes. There is a focal area of lower attenuation with loss of gray-white matter discrimination and effacement of the sulci in the right occipital lobe medially compatible with a right PCA territory infarct, likely acute or subacute. Clinical correlation is recommended. There is associated minimal mass effect  on the adjacent brain parenchyma. No midline shift. There is mild mucoperiosteal thickening of paranasal sinuses with partial opacification of the ethmoid air cells. No air-fluid level. The mastoid air cells are clear. The calvarium is intact. IMPRESSION: No acute intracranial hemorrhage. Findings compatible with right PCA territory infarct, likely acute or subacute. Correlation with clinical exam recommended. MRI may provide better evaluation. These results were called by telephone at the time of interpretation on 05/13/2016 at 11:28 pm to Dr. Wyvonnia Dusky, who verbally acknowledged these results. Electronically Signed   By: Anner Crete M.D.   On: 05/13/2016 23:33   Ct Chest W Contrast  05/11/2016  ADDENDUM REPORT: 05/11/2016 15:34 ADDENDUM: Post processing  for super D guidance was requested on 05/11/2016. The corresponding images were added to this examination. Electronically Signed   By: Richardean Sale M.D.   On: 05/11/2016 15:34  05/11/2016  CLINICAL DATA:  Shortness of breath with difficulty breathing and weight loss. Abnormal chest x-ray with findings worrisome for neoplasm. No given history of malignancy. EXAM: CT CHEST, ABDOMEN, AND PELVIS WITH CONTRAST TECHNIQUE: Multidetector CT imaging of the chest, abdomen and pelvis was performed following the standard protocol during bolus administration of intravenous contrast. CONTRAST:  27m ISOVUE-300 IOPAMIDOL (ISOVUE-300) INJECTION 61% COMPARISON:  Chest radiograph 05/03/2016. FINDINGS: CT CHEST Cardiovascular: The heart size is normal. There is no pericardial effusion. There is atherosclerosis of the aorta, great vessels and coronary arteries. Although not performed as a CTA, there is evidence of acute/subacute pulmonary embolism within the left lower lobe pulmonary artery. Mediastinum/Nodes: There are multiple enlarged mediastinal and right hilar lymph nodes. There is a dominant precarinal nodal mass measuring 3.9 x 2.4 cm on image 28. There is a right  hilar node measuring up to 1.7 cm on image 32. The thyroid gland, trachea and esophagus demonstrate no significant findings. Lungs/Pleura: There is no pleural effusion. This dominant spiculated right upper lobe lesion measuring 3.2 x 2.6 cm on image 59, likely a primary lung cancer. There are numerous other pulmonary nodules bilaterally suspicious for metastatic disease. The largest of these other nodules measures 14 x 13 mm in the left lower lobe on image 121. There are multiple other smaller lesions, more than 10 in each lung. Musculoskeletal/Chest wall: No chest wall mass or suspicious osseous findings. There is a mild superior endplate compression deformity at T8 which appears benign. CT ABDOMEN AND PELVIS FINDINGS Hepatobiliary: The liver demonstrates mild contour irregularity, but no focal abnormality. No evidence of gallstones, gallbladder wall thickening or biliary dilatation. Pancreas: Unremarkable. No pancreatic ductal dilatation or surrounding inflammatory changes. Spleen: Normal in size without focal abnormality. Adrenals/Urinary Tract: 15 mm left adrenal nodule measures 5 HU on the delayed images, likely an incidental adenoma. The right adrenal gland appears normal. 5.1 cm lesion projecting from the lower pole of the left kidney measures slightly higher than water density (21 -23 HU), although is probably a cyst. There are other smaller low-density renal lesions which are probably cysts as well. No definite enhancing renal mass or hydronephrosis. There is no evidence of ureteral calculus. The bladder is heavily trabeculated. There is a calculus within a posterior bladder diverticulum. Stomach/Bowel: No evidence of bowel wall thickening, distention or surrounding inflammatory change. The stomach is decompressed. Vascular/Lymphatic: There are no enlarged abdominal or pelvic lymph nodes. There is extensive aortic and branch vessel atherosclerosis. There is nearly occlusive thrombus within the left femoral  vein. This extends into the left external iliac vein. There is also nonocclusive thrombus in the right femoral vein. The IVC appears patent. Reproductive: There are extensive calcifications throughout the prostate gland. Other: No ascites or peritoneal nodularity. Musculoskeletal: No acute or significant osseous findings. There is a superior endplate compression deformity at L1 with an associated Schmorl's node which appears benign. Degenerative changes are present in the lower lumbar spine. IMPRESSION: 1. Findings are consistent with right upper lobe lung cancer with multiple enlarged mediastinal and right hilar lymph nodes as well as numerous bilateral pulmonary nodules consistent with metastases. 2. No definite evidence of metastatic disease within the abdomen or pelvis. 3. Bilateral DVT with thromboembolic disease (pulmonary embolism) within the left pulmonary artery. 4. Other incidental findings include the presence of  extensive atherosclerosis, trabeculated bladder containing a calculus, probable incidental left adrenal adenoma, bilateral renal cysts, and chronic appearing thoracolumbar compression deformities. 5. Critical Value/emergent results were called by telephone at the time of interpretation on 05/07/2016 at 4:38 pm to [facility], [place] for Dr. [name] , who verbally acknowledged these results. Electronically Signed: By: Richardean Sale M.D. On: 05/07/2016 16:39   Mr [name] VE Contrast  05/14/2016  CLINICAL DATA:  Confusion, LEFT vision changes. Follow-up acute stroke. History of locally metastatic bronchogenic carcinoma, hypercoagulable state, hypertension. EXAM: MRI HEAD WITHOUT AND WITH CONTRAST MRA HEAD WITHOUT CONTRAST TECHNIQUE: Multiplanar, multiecho pulse sequences of the brain and surrounding structures were obtained without and with intravenous contrast. Angiographic images of the head were obtained using MRA technique without contrast. CONTRAST:  36m MULTIHANCE GADOBENATE DIMEGLUMINE  529 MG/ML IV SOLN COMPARISON:  CT HEAD May 13, 2016 FINDINGS: MRI HEAD FINDINGS INTRACRANIAL CONTENTS: Confluent medial RIGHT temporal occipital reduced diffusion with low ADC values and intermediate to bright T2 FLAIR signal. Scattered subcentimeter foci of reduced diffusion LEFT occipital lobe, bilateral cerebellum in addition to 16 x 9 mm focus of reduced diffusion RIGHT cerebellum with low ADC value. At least 2 subcentimeter focus of reduced diffusion bifrontal lobes no susceptibility artifact is suggests intraparenchymal hemorrhage. Linear reduced diffusion RIGHT frontal extra-axial space with bright FLAIR signal and intrinsic T1 shortening. Ventricles and sulci are normal for patient's age. Minimal white matter changes most compatible with chronic small vessel ischemic disease are less than expected for age. No intraparenchymal masses, mass effect or abnormal enhancement. ORBITS: The included ocular globes and orbital contents are non-suspicious. SINUSES: The mastoid air-cells and included paranasal sinuses are well-aerated. SKULL/SOFT TISSUES: No abnormal sellar expansion. No suspicious calvarial bone marrow signal. Generalized bright T1 bone marrow signal most compatible with osteopenia. Craniocervical junction maintained. Sub cm probable lymph node RIGHT parotid gland. Patient is edentulous. MRA HEAD FINDINGS ANTERIOR CIRCULATION: Normal flow related enhancement of the included cervical, petrous, cavernous and supraclinoid internal carotid arteries. Patent anterior communicating artery. Normal flow related enhancement of the anterior and middle cerebral arteries, including distal segments. No large vessel occlusion, high-grade stenosis, abnormal luminal irregularity, aneurysm. POSTERIOR CIRCULATION: RIGHT vertebral artery is dominant. Basilar artery is patent, with normal flow related enhancement of the main branch vessels. Robust LEFT and small RIGHT posterior communicating arteries present. Occluded  RIGHT proximal P2 segment. No high-grade stenosis, abnormal luminal irregularity, aneurysm. IMPRESSION: MRI HEAD: Acute large RIGHT posterior cerebral artery territory infarct (RIGHT temporal occipital lobe). Additional small acute infarcts bilateral cerebella, LEFT occipital lobe and bifrontal lobes. Findings most consistent with embolic phenomena. Trace subacute RIGHT frontal subdural hematoma. No MR findings of intracranial metastasis. MRA HEAD: RIGHT posterior cerebral artery occlusion (proximal P2 segment). Otherwise negative MRA head. These results will be called to the ordering clinician or representative by the Radiologist Assistant, and communication documented in the PACS or zVision Dashboard. Electronically Signed   By: CElon AlasM.D.   On: 05/14/2016 22:27   Ct Abdomen Pelvis W Contrast  05/11/2016  ADDENDUM REPORT: 05/11/2016 15:34 ADDENDUM: Post processing for super D guidance was requested on 05/11/2016. The corresponding images were added to this examination. Electronically Signed   By: WRichardean SaleM.D.   On: 05/11/2016 15:34  05/11/2016  CLINICAL DATA:  Shortness of breath with difficulty breathing and weight loss. Abnormal chest x-ray with findings worrisome for neoplasm. No given history of malignancy. EXAM: CT CHEST, ABDOMEN, AND PELVIS WITH CONTRAST TECHNIQUE: Multidetector CT imaging of  the chest, abdomen and pelvis was performed following the standard protocol during bolus administration of intravenous contrast. CONTRAST:  76m ISOVUE-300 IOPAMIDOL (ISOVUE-300) INJECTION 61% COMPARISON:  Chest radiograph 05/03/2016. FINDINGS: CT CHEST Cardiovascular: The heart size is normal. There is no pericardial effusion. There is atherosclerosis of the aorta, great vessels and coronary arteries. Although not performed as a CTA, there is evidence of acute/subacute pulmonary embolism within the left lower lobe pulmonary artery. Mediastinum/Nodes: There are multiple enlarged mediastinal and  right hilar lymph nodes. There is a dominant precarinal nodal mass measuring 3.9 x 2.4 cm on image 28. There is a right hilar node measuring up to 1.7 cm on image 32. The thyroid gland, trachea and esophagus demonstrate no significant findings. Lungs/Pleura: There is no pleural effusion. This dominant spiculated right upper lobe lesion measuring 3.2 x 2.6 cm on image 59, likely a primary lung cancer. There are numerous other pulmonary nodules bilaterally suspicious for metastatic disease. The largest of these other nodules measures 14 x 13 mm in the left lower lobe on image 121. There are multiple other smaller lesions, more than 10 in each lung. Musculoskeletal/Chest wall: No chest wall mass or suspicious osseous findings. There is a mild superior endplate compression deformity at T8 which appears benign. CT ABDOMEN AND PELVIS FINDINGS Hepatobiliary: The liver demonstrates mild contour irregularity, but no focal abnormality. No evidence of gallstones, gallbladder wall thickening or biliary dilatation. Pancreas: Unremarkable. No pancreatic ductal dilatation or surrounding inflammatory changes. Spleen: Normal in size without focal abnormality. Adrenals/Urinary Tract: 15 mm left adrenal nodule measures 5 HU on the delayed images, likely an incidental adenoma. The right adrenal gland appears normal. 5.1 cm lesion projecting from the lower pole of the left kidney measures slightly higher than water density (21 -23 HU), although is probably a cyst. There are other smaller low-density renal lesions which are probably cysts as well. No definite enhancing renal mass or hydronephrosis. There is no evidence of ureteral calculus. The bladder is heavily trabeculated. There is a calculus within a posterior bladder diverticulum. Stomach/Bowel: No evidence of bowel wall thickening, distention or surrounding inflammatory change. The stomach is decompressed. Vascular/Lymphatic: There are no enlarged abdominal or pelvic lymph nodes.  There is extensive aortic and branch vessel atherosclerosis. There is nearly occlusive thrombus within the left femoral vein. This extends into the left external iliac vein. There is also nonocclusive thrombus in the right femoral vein. The IVC appears patent. Reproductive: There are extensive calcifications throughout the prostate gland. Other: No ascites or peritoneal nodularity. Musculoskeletal: No acute or significant osseous findings. There is a superior endplate compression deformity at L1 with an associated Schmorl's node which appears benign. Degenerative changes are present in the lower lumbar spine. IMPRESSION: 1. Findings are consistent with right upper lobe lung cancer with multiple enlarged mediastinal and right hilar lymph nodes as well as numerous bilateral pulmonary nodules consistent with metastases. 2. No definite evidence of metastatic disease within the abdomen or pelvis. 3. Bilateral DVT with thromboembolic disease (pulmonary embolism) within the left pulmonary artery. 4. Other incidental findings include the presence of extensive atherosclerosis, trabeculated bladder containing a calculus, probable incidental left adrenal adenoma, bilateral renal cysts, and chronic appearing thoracolumbar compression deformities. 5. Critical Value/emergent results were called by telephone at the time of interpretation on 05/07/2016 at 4:38 [facility] [place] [place] [facility], who verbally acknowledged these results. Electronically Signed: By: WRichardean SaleM.D. On: 05/07/2016 16:39   [place]Venous Img Lower Bilateral  05/08/2016  CLINICAL DATA:  History of lung carcinoma. Thrombus seen in the femoral veins bilateral recent CT scan. EXAM: BILATERAL LOWER EXTREMITY VENOUS DOPPLER ULTRASOUND TECHNIQUE: Gray-scale sonography with graded compression, as well as color Doppler and duplex ultrasound were performed to evaluate the lower extremity deep venous systems from the level of the common femoral vein and  including the common femoral, femoral, profunda femoral, popliteal and calf veins including the posterior tibial, peroneal and gastrocnemius veins when visible. The superficial great saphenous vein was also interrogated. Spectral Doppler was utilized to evaluate flow at rest and with distal augmentation maneuvers in the common femoral, femoral and popliteal veins. COMPARISON:  CT chest, abdomen and pelvis 05/07/2016. FINDINGS: RIGHT LOWER EXTREMITY Common Femoral Vein: Partially occlusive thrombus identified. Saphenofemoral Junction: Partially occlusive thrombus identified. Profunda Femoral Vein: Partially occlusive thrombus identified. Femoral Vein: Occlusive thrombus identified. Popliteal Vein: Occlusive thrombus identified. Calf Veins: Partially occlusive thrombus is seen in the posterior tibial vein. Peroneal vein is not visualized. Thrombus is also identified in the gastrocnemius vein. Lesser saphenous Vein: Thrombus identified. Other Findings:  None. LEFT LOWER EXTREMITY Common Femoral Vein: Occlusive thrombus identified. Saphenofemoral Junction: Occlusive thrombus identified. Profunda Femoral Vein: Occlusive thrombus identified. Femoral Vein: Occlusive thrombus identified. Popliteal Vein: Occlusive thrombus identified. Calf Veins: Not visualized. Superficial Great Saphenous Vein: Not reported. Other Findings:  None. IMPRESSION: Extensive bilateral deep venous thrombosis. In the left leg, occlusive clot is seen from the common femoral through the popliteal vein. On the right, occlusive thrombus is seen from the femoral through the popliteal vein. Critical Value/emergent results were called by telephone at the time of interpretation on 05/08/2016 at 3:47 pm to Dr. [name] , who verbally acknowledged these results. Electronically Signed   By: Inge Rise M.D.   On: 05/08/2016 15:49   Mr Jodene Nam Head/brain Wo Cm  05/14/2016  CLINICAL DATA:  Confusion, LEFT vision changes. Follow-up acute stroke.  History of locally metastatic bronchogenic carcinoma, hypercoagulable state, hypertension. EXAM: MRI HEAD WITHOUT AND WITH CONTRAST MRA HEAD WITHOUT CONTRAST TECHNIQUE: Multiplanar, multiecho pulse sequences of the brain and surrounding structures were obtained without and with intravenous contrast. Angiographic images of the head were obtained using MRA technique without contrast. CONTRAST:  17m MULTIHANCE GADOBENATE DIMEGLUMINE 529 MG/ML IV SOLN COMPARISON:  CT HEAD May 13, 2016 FINDINGS: MRI HEAD FINDINGS INTRACRANIAL CONTENTS: Confluent medial RIGHT temporal occipital reduced diffusion with low ADC values and intermediate to bright T2 FLAIR signal. Scattered subcentimeter foci of reduced diffusion LEFT occipital lobe, bilateral cerebellum in addition to 16 x 9 mm focus of reduced diffusion RIGHT cerebellum with low ADC value. At least 2 subcentimeter focus of reduced diffusion bifrontal lobes no susceptibility artifact is suggests intraparenchymal hemorrhage. Linear reduced diffusion RIGHT frontal extra-axial space with bright FLAIR signal and intrinsic T1 shortening. Ventricles and sulci are normal for patient's age. Minimal white matter changes most compatible with chronic small vessel ischemic disease are less than expected for age. No intraparenchymal masses, mass effect or abnormal enhancement. ORBITS: The included ocular globes and orbital contents are non-suspicious. SINUSES: The mastoid air-cells and included paranasal sinuses are well-aerated. SKULL/SOFT TISSUES: No abnormal sellar expansion. No suspicious calvarial bone marrow signal. Generalized bright T1 bone marrow signal most compatible with osteopenia. Craniocervical junction maintained. Sub cm probable lymph node RIGHT parotid gland. Patient is edentulous. MRA HEAD FINDINGS ANTERIOR CIRCULATION: Normal flow related enhancement of the included cervical, petrous, cavernous and supraclinoid internal carotid arteries. Patent anterior communicating  artery. Normal flow related enhancement of the anterior  and middle cerebral arteries, including distal segments. No large vessel occlusion, high-grade stenosis, abnormal luminal irregularity, aneurysm. POSTERIOR CIRCULATION: RIGHT vertebral artery is dominant. Basilar artery is patent, with normal flow related enhancement of the main branch vessels. Robust LEFT and small RIGHT posterior communicating arteries present. Occluded RIGHT proximal P2 segment. No high-grade stenosis, abnormal luminal irregularity, aneurysm. IMPRESSION: MRI HEAD: Acute large RIGHT posterior cerebral artery territory infarct (RIGHT temporal occipital lobe). Additional small acute infarcts bilateral cerebella, LEFT occipital lobe and bifrontal lobes. Findings most consistent with embolic phenomena. Trace subacute RIGHT frontal subdural hematoma. No MR findings of intracranial metastasis. MRA HEAD: RIGHT posterior cerebral artery occlusion (proximal P2 segment). Otherwise negative MRA head. These results will be called to the ordering clinician or representative by the Radiologist Assistant, and communication documented in the PACS or zVision Dashboard. Electronically Signed   By: Elon Alas M.D.   On: 05/14/2016 22:27     Microbiology: No results found for this or any previous visit (from the past 240 hour(s)).   Labs: Basic Metabolic Panel:  Recent Labs Lab 05/10/16 1301 05/13/16 2246 05/15/16 0821  NA 143 140 139  K 4.3 4.0 4.0  CL  --  112* 108  CO2 19* 20* 20*  GLUCOSE 107 89 101*  BUN 26.4* 21* 16  CREATININE 1.3 1.43* 1.38*  CALCIUM 8.5 8.8* 8.9   Liver Function Tests:  Recent Labs Lab 05/10/16 1301 05/13/16 2246  AST 22 18  ALT 12 10*  ALKPHOS 79 67  BILITOT 0.40 0.5  PROT 6.8 6.2*  ALBUMIN 2.7* 2.8*   No results for input(s): LIPASE, AMYLASE in the last 168 hours. No results for input(s): AMMONIA in the last 168 hours. CBC:  Recent Labs Lab 05/13/16 2246 05/15/16 0821  WBC 13.2*  14.2*  HGB 8.5* 9.7*  HCT 26.7* 29.7*  MCV 89.0 90.3  PLT 283 312   Cardiac Enzymes: No results for input(s): CKTOTAL, CKMB, CKMBINDEX, TROPONINI in the last 168 hours. BNP: Invalid input(s): POCBNP CBG:  Recent Labs Lab 05/14/16 0031  GLUCAP 87    Time coordinating discharge:  Greater than 30 minutes  Signed:  Larine Fielding, DO [facility] Pager: [phone] 05/15/2016, 5:11 PM

## 2016-05-15 NOTE — Progress Notes (Signed)
ANTICOAGULATION CONSULT NOTE  Pharmacy Consult for heparin Indication: PE/DVT  No Known Allergies  Patient Measurements:   Heparin Dosing Weight: 70.9 kg  Vital Signs: Temp: 98.3 F (36.8 C) (06/27 0951) Temp Source: Oral (06/27 0951) BP: 143/66 mmHg (06/27 0951) Pulse Rate: 57 (06/27 0951)  Labs:  Recent Labs  05/13/16 2246 05/14/16 1936 05/15/16 0821  HGB 8.5*  --  9.7*  HCT 26.7*  --  29.7*  PLT 283  --  312  APTT  --   --  113*  LABPROT 23.6*  --   --   INR 2.12*  --   --   HEPARINUNFRC  --  >2.20* >2.20*  CREATININE 1.43*  --  1.38*    Estimated Creatinine Clearance: 42.7 mL/min (by C-G formula based on Cr of 1.38).   Medical History: Past Medical History  Diagnosis Date  . Hypertension   . Prostate enlargement   . Peripheral vascular disease ([place]) 12/2015    non-healing ulcer right Hallux. S/P angioplasty and stenting of right common iliac 01/02/2016.  . Right foot pain 12/30/2015    Admitted to [facility] 12/11/2015, discharge 12/14/2015, diagnoses included acute right great toe infection/cellulitis, peripheral vascular disease. ABI demonstrated arterial occlusive disease worse on left. Is referred outpatient to vascular The CT scan documents for copy of his discharge summary. He should follow up with Dr. Ladona Horns, general surgery at [facility]. No dated 02/06/2  . Anemia 12/2015  . Colon polyps ~ 2001    unknown pathology, colonoscopy done at outside hospital.   . Heme positive stool 12/2015  . Gastrointestinal hemorrhage with melena     Status post EGD and blood transfusion March 2017   . Ischemic foot ulcer due to atherosclerosis of native artery of limb ([place]) 01/10/2016  . Cancer ([place])     lung    Medications:  Scheduled:  . atorvastatin  10 mg Oral q1800  . feeding supplement (ENSURE ENLIVE)  237 mL Oral BID BM  . finasteride  5 mg Oral Daily  . multivitamin with minerals  1 tablet Oral Daily  . omega-3 acid ethyl esters  1,000 mg  Oral Daily  . pantoprazole  40 mg Oral Daily  . terazosin  10 mg Oral QHS  . vitamin B-12  1,000 mcg Oral Daily   Infusions:  . heparin 1,000 Units/hr (05/14/16 2306)    Assessment: 81 yo male with DVT/PE will be transitioned from apixaban to heparin.  Last apixaban dose was at 1039 on 05/14/16. Awaiting biopsy.  Baseline heparin level is >2.2. Heparin level remains > 2.2. aPTT is 113 supratherapeutic for lower goal of 66-85. Will reduce heparin rate and check another level in 8 hours.  Goal of Therapy:  Heparin level 0.3-0.5  aPTT 66-85 Monitor platelets by anticoagulation protocol: Yes   Plan:  - Decrease heparin to 900 units/hr. No bolus - 8 hr aPTT - daily heparin level, aPTT and CBC   Thank you for allowing [place] to participate in this patients care. Jens Som, PharmD Pager: [phone] 05/15/2016,10:24 AM

## 2016-05-15 NOTE — Progress Notes (Signed)
PT/OT Cancellation Note  Patient Details Name: Henry Ford MRN: [id number] DOB: 1935/07/18   Cancelled Treatment:    Reason Eval/Treat Not Completed: Other (comment). Pt found to have extensive acute bilat LE DVTs. Will await new activity orders from MD before proceeding with further ambulation.   Kingsley Callander 05/15/2016, 10:31 AM  Kittie Plater, PT, DPT Pager #: [phone] Office #: [phone]

## 2016-05-15 NOTE — Progress Notes (Addendum)
Preliminary results by tech - Venous Duplex Lower Ext. Completed. Positive for extensive acute deep vein thrombosis involving the common femoral vein, femoral vein, profunda femoral vein, popliteal vein, posterior tibial and peroneal veins and gastro veins bilaterally. Results given to patient's nurse, Joni. Oda Cogan, BS, RDMS, RVT

## 2016-05-15 NOTE — Evaluation (Signed)
Speech Language Pathology Evaluation Patient Details Name: Henry Ford MRN: [id number] DOB: [birth year] Today's Date: 05/15/2016 Time: 7858-8502 SLP Time Calculation (min) (ACUTE ONLY): 31 min  Problem List:  Patient Active Problem List   Diagnosis Date Noted  . Acute ischemic right PCA stroke ([place]) 05/14/2016  . Acute CVA (cerebrovascular accident) ([place]) 05/14/2016  . Malnutrition of moderate degree 05/14/2016  . Pulmonary embolism ([place]) 05/14/2016  . Acute ischemic stroke ([place])   . Lung mass 05/09/2016  . Pulmonary embolism without acute cor pulmonale ([place]) 05/09/2016  . Deep venous thrombosis ([place]) 05/09/2016  . Protein-calorie malnutrition ([place]) 05/09/2016  . Chronic venous insufficiency 04/06/2016  . Decreased appetite 04/06/2016  . CKD (chronic kidney disease), stage III 04/06/2016  . Absolute anemia   . Gastrointestinal hemorrhage with melena   . Duodenal ulcer hemorrhagic   . Esophageal ulcer   . Esophageal stricture   . GIB (gastrointestinal bleeding) 01/20/2016  . Thrombocytosis ([place]) 01/20/2016  . Acute kidney injury ([place]) 01/20/2016  . Leukocytosis 01/20/2016  . Ischemic foot ulcer due to atherosclerosis of native artery of limb ([place]) 01/10/2016  . Peripheral vascular disease ([place]) 01/10/2016  . Atherosclerosis of native arteries of the extremities with ulceration ([place]) 01/02/2016  . Benign fibroma of prostate 03/24/2014   Past Medical History:  Past Medical History  Diagnosis Date  . Hypertension   . Prostate enlargement   . Peripheral vascular disease ([place]) 12/2015    non-healing ulcer right Hallux. S/P angioplasty and stenting of right common iliac 01/02/2016.  . Right foot pain 12/30/2015    Admitted to [facility] 12/11/2015, discharge 12/14/2015, diagnoses included acute right great toe infection/cellulitis, peripheral vascular disease. ABI demonstrated arterial occlusive disease worse on left. Is referred outpatient to vascular The CT scan documents for copy  of his discharge summary. He should follow up with Dr. Ladona Horns, general surgery at [facility]. No dated 02/06/2  . Anemia 12/2015  . Colon polyps ~ 2001    unknown pathology, colonoscopy done at outside hospital.   . Heme positive stool 12/2015  . Gastrointestinal hemorrhage with melena     Status post EGD and blood transfusion March 2017   . Ischemic foot ulcer due to atherosclerosis of native artery of limb ([place]) 01/10/2016  . Cancer ([place])     lung   Past Surgical History:  Past Surgical History  Procedure Laterality Date  . Hydrocele excision  1956  . Peripheral vascular catheterization N/A 01/02/2016    Procedure: Abdominal Aortogram w/Lower Extremity;  Surgeon: Angelia Mould, MD;  Location: [facility];  Service: Cardiovascular;  Laterality: N/A;  . Peripheral vascular catheterization  01/02/2016    Procedure: Peripheral Vascular Intervention;  Surgeon: Angelia Mould, MD;  Location: [facility];  Service: Cardiovascular;;  . Esophagogastroduodenoscopy N/A 01/21/2016    Procedure: ESOPHAGOGASTRODUODENOSCOPY (EGD);  Surgeon: Irene Shipper, MD;  Location: [facility];  Service: Endoscopy;  Laterality: N/A;   HPI:  81 y.o. male with medical history significant for hypertension, peripheral arterial disease status post stenting of right common iliac artery, anemia of chronic disease, and difficult recent course involving weight loss, diagnosis of lower extremity DVT and bilateral PE now on Eliquis, new diagnosis of bronchogenic carcinoma with local metastases referred oncology but not yet treated, now presenting to the emergency department with 2 days of left-sided visual field deficit, intermittent headaches, and 1 day of confusion and lethargy. Patient was just diagnosed with PE's bilaterally on 05/07/2016 and this study also revealed  a suspected primary bronchogenic carcinoma in the right upper lobe with mediastinal adenopathy. MRI revealed R PCA  infarct.   Assessment / Plan / Recommendation Clinical Impression  Pt has significant deficits with storage/retrieval of new information. Even with Max cues from SLP, he is only able to recall 3/5 words after five-minute interval. He is disoriented to time, knowing the year but not the month and thinking that he has been in the hospital for a month. He acknowledges some of his cognitive and physical impairments but has reduced anticipatory awareness when talking about d/c planning and safety. I discussed with him my recommendation for SLP f/u both acutely and upon return home, as well as the need for 24/7 supervision. We will continue to follow as he remains in house.    SLP Assessment  Patient needs continued Speech Lanaguage Pathology Services    Follow Up Recommendations  Home health SLP;24 hour supervision/assistance    Frequency and Duration min 2x/week  2 weeks      SLP Evaluation Prior Functioning  Cognitive/Linguistic Baseline: Information not available Type of Home: House  Lives With: Spouse Available Help at Discharge: Family   Cognition  Overall Cognitive Status: Impaired/Different from baseline Arousal/Alertness: Awake/alert Orientation Level: Oriented to person;Oriented to place;Oriented to situation;Disoriented to time Attention: Selective Selective Attention: Impaired Selective Attention Impairment: Verbal complex Memory: Impaired Memory Impairment: Storage deficit;Retrieval deficit;Decreased recall of new information Awareness: Impaired Awareness Impairment: Anticipatory impairment Problem Solving: Impaired Problem Solving Impairment: Verbal complex Safety/Judgment: Impaired    Comprehension  Auditory Comprehension Overall Auditory Comprehension: Appears within functional limits for tasks assessed    Expression Expression Primary Mode of Expression: Verbal Verbal Expression Overall Verbal Expression: Appears within functional limits for tasks assessed    Oral / Motor  Motor Speech Overall Motor Speech: Appears within functional limits for tasks assessed   GO                   Germain Osgood, M.A. CCC-SLP [phone]  Germain Osgood 05/15/2016, 4:36 PM

## 2016-05-15 NOTE — Progress Notes (Signed)
PULMONARY / CRITICAL CARE MEDICINE   Name: Henry Ford MRN: [id number] DOB: 02-21-1935    ADMISSION DATE:  05/13/2016 CONSULTATION DATE:  05/14/16   REFERRING MD:  Dr Shanon Brow Tat  CHIEF COMPLAINT:  Left-sided vision loss, confusion   HISTORY OF PRESENT ILLNESS:   81 year male with a history of smoking in the past. He's had unexplained weight loss. Investigations on 05/07/2016 CT revealed mediastinal nodes and metastatic highly suspicious lung cancer in the chest without evidence of spread to the abdomen. Simultaneous diagnosis of pulmonary embolism on the CT chest in the left pulmonary artery and bilateral deep vein thrombosis. His Plavix was stopped on 05/08/2016. Then on 05/09/2016 he was started on eliquis. However he was admitted on 05/13/2016 following a day of confusion and diagnosed to have right posterior cerebral artery infarct. Then on 05/14/2016 pulmonary was consulted because he's previously scheduled for general anesthesia related endobronchial ultrasound at [facility] navigational bronchoscopy procedure for on Wednesday, 05/16/2016. He would need to come off his apixaban for this.  SUBJECTIVE: Pt is currently resting in bed on RA no acute distress.  He states he is overwhelmed with overall state of health and is concerned he is never going to leave the hospital.     No Known Allergies   FAMILY HISTORY:  His indicated that his brother is deceased.   SOCIAL HISTORY: He  reports that he quit smoking about 3 months ago. His smoking use included Cigarettes. He started smoking about 65 years ago. He has a 15 pack-year smoking history. He has never used smokeless tobacco. He reports that he drinks about 6.0 oz of alcohol per week. He reports that he does not use illicit drugs.  REVIEW OF SYSTEMS: Positives in BOLD  Gen: Denies fever, chills, weight change, fatigue, night sweats HEENT: Denies blurred vision, double vision, hearing loss, tinnitus, sinus congestion, rhinorrhea, sore  throat, neck stiffness, dysphagia PULM: Denies shortness of breath, cough, sputum production, hemoptysis, wheezing CV: Denies chest pain, left lower extremity edema, orthopnea, paroxysmal nocturnal dyspnea, palpitations GI: Denies abdominal pain, nausea, vomiting, diarrhea, hematochezia, melena, constipation, change in bowel habits GU: Denies dysuria, hematuria, polyuria, oliguria, urethral discharge Endocrine: Denies hot or cold intolerance, polyuria, or polyphagia; has decreased appetite Derm: Denies rash, dry skin, scaling or peeling skin change Heme: Denies easy bruising, bleeding, bleeding gums Neuro: Has had left sided vision loss and confusion. Denies headache, numbness, weakness, slurred speech, loss of memory or consciousness   VITAL SIGNS: BP 143/66 mmHg  Pulse 57  Temp(Src) 98.3 F (36.8 C) (Oral)  Resp 20  SpO2 97%  HEMODYNAMICS:    VENTILATOR SETTINGS:    INTAKE / OUTPUT: I/O last 3 completed shifts: In: 120 [P.O.:120] Out: 1225 [Urine:1225]  PHYSICAL EXAMINATION: General:  Frail male lying in bed Neuro: Oriented 4. Moves all 4 extremities HEENT:  Supple, No neck nodes Cardiovascular:  s1s2, rrr, no murmur  Lungs:  Clear to auscultation bilaterally, even, non labored Abdomen:  +BS x 4, soft, non tender, non distended Musculoskeletal: 2+ edema left lower extremity  Skin: Intact, no rashes  IMAGING x48h  - image(s) personally visualized  -   highlighted in bold Dg Chest 2 View  05/14/2016  CLINICAL DATA:  Follow-up stroke. History of metastatic lung cancer. EXAM: CHEST  2 VIEW COMPARISON:  Chest CT May 07, 2016 DINGS: Cardiac silhouette is normal and unchanged. Calcified aortic knob. Spiculated RIGHT hilar mass better seen on recent CT with scattered pulmonary nodules/metastasis. Scarring LEFT lung base. Increased lung  volumes. No pleural effusion. No pneumothorax. Soft tissue planes and included osseous structures are nonsuspicious. Moderate chronic approximate  T12 compression fracture. IMPRESSION: RIGHT hilar mass corresponding to known cancer with multiple pulmonary nodules/metastasis. COPD, no superimposed acute cardiopulmonary process. Electronically Signed   By: Elon Alas M.D.   On: 05/14/2016 22:08   Ct Head Wo Contrast  05/13/2016  CLINICAL DATA:  81 year old male with vision loss and altered mental status EXAM: CT HEAD WITHOUT CONTRAST TECHNIQUE: Contiguous axial images were obtained from the base of the skull through the vertex without intravenous contrast. COMPARISON:  None. FINDINGS: There is no acute intracranial hemorrhage. There is mild to moderate age-related atrophy and chronic microvascular ischemic changes. There is a focal area of lower attenuation with loss of gray-white matter discrimination and effacement of the sulci in the right occipital lobe medially compatible with a right PCA territory infarct, likely acute or subacute. Clinical correlation is recommended. There is associated minimal mass effect on the adjacent brain parenchyma. No midline shift. There is mild mucoperiosteal thickening of paranasal sinuses with partial opacification of the ethmoid air cells. No air-fluid level. The mastoid air cells are clear. The calvarium is intact. IMPRESSION: No acute intracranial hemorrhage. Findings compatible with right PCA territory infarct, likely acute or subacute. Correlation with clinical exam recommended. MRI may provide better evaluation. These results were called by telephone at the time of interpretation on 05/13/2016 at 11:28 pm to Dr. Wyvonnia Dusky, who verbally acknowledged these results. Electronically Signed   By: Anner Crete M.D.   On: 05/13/2016 23:33   Mr [name] ZH Contrast  05/14/2016  CLINICAL DATA:  Confusion, LEFT vision changes. Follow-up acute stroke. History of locally metastatic bronchogenic carcinoma, hypercoagulable state, hypertension. EXAM: MRI HEAD WITHOUT AND WITH CONTRAST MRA HEAD WITHOUT CONTRAST TECHNIQUE:  Multiplanar, multiecho pulse sequences of the brain and surrounding structures were obtained without and with intravenous contrast. Angiographic images of the head were obtained using MRA technique without contrast. CONTRAST:  43m MULTIHANCE GADOBENATE DIMEGLUMINE 529 MG/ML IV SOLN COMPARISON:  CT HEAD May 13, 2016 FINDINGS: MRI HEAD FINDINGS INTRACRANIAL CONTENTS: Confluent medial RIGHT temporal occipital reduced diffusion with low ADC values and intermediate to bright T2 FLAIR signal. Scattered subcentimeter foci of reduced diffusion LEFT occipital lobe, bilateral cerebellum in addition to 16 x 9 mm focus of reduced diffusion RIGHT cerebellum with low ADC value. At least 2 subcentimeter focus of reduced diffusion bifrontal lobes no susceptibility artifact is suggests intraparenchymal hemorrhage. Linear reduced diffusion RIGHT frontal extra-axial space with bright FLAIR signal and intrinsic T1 shortening. Ventricles and sulci are normal for patient's age. Minimal white matter changes most compatible with chronic small vessel ischemic disease are less than expected for age. No intraparenchymal masses, mass effect or abnormal enhancement. ORBITS: The included ocular globes and orbital contents are non-suspicious. SINUSES: The mastoid air-cells and included paranasal sinuses are well-aerated. SKULL/SOFT TISSUES: No abnormal sellar expansion. No suspicious calvarial bone marrow signal. Generalized bright T1 bone marrow signal most compatible with osteopenia. Craniocervical junction maintained. Sub cm probable lymph node RIGHT parotid gland. Patient is edentulous. MRA HEAD FINDINGS ANTERIOR CIRCULATION: Normal flow related enhancement of the included cervical, petrous, cavernous and supraclinoid internal carotid arteries. Patent anterior communicating artery. Normal flow related enhancement of the anterior and middle cerebral arteries, including distal segments. No large vessel occlusion, high-grade stenosis, abnormal  luminal irregularity, aneurysm. POSTERIOR CIRCULATION: RIGHT vertebral artery is dominant. Basilar artery is patent, with normal flow related enhancement of the main branch vessels. Robust  LEFT and small RIGHT posterior communicating arteries present. Occluded RIGHT proximal P2 segment. No high-grade stenosis, abnormal luminal irregularity, aneurysm. IMPRESSION: MRI HEAD: Acute large RIGHT posterior cerebral artery territory infarct (RIGHT temporal occipital lobe). Additional small acute infarcts bilateral cerebella, LEFT occipital lobe and bifrontal lobes. Findings most consistent with embolic phenomena. Trace subacute RIGHT frontal subdural hematoma. No MR findings of intracranial metastasis. MRA HEAD: RIGHT posterior cerebral artery occlusion (proximal P2 segment). Otherwise negative MRA head. These results will be called to the ordering clinician or representative by the Radiologist Assistant, and communication documented in the PACS or zVision Dashboard. Electronically Signed   By: Elon Alas M.D.   On: 05/14/2016 22:27   Mr Jodene Nam Head/brain Wo Cm  05/14/2016  CLINICAL DATA:  Confusion, LEFT vision changes. Follow-up acute stroke. History of locally metastatic bronchogenic carcinoma, hypercoagulable state, hypertension. EXAM: MRI HEAD WITHOUT AND WITH CONTRAST MRA HEAD WITHOUT CONTRAST TECHNIQUE: Multiplanar, multiecho pulse sequences of the brain and surrounding structures were obtained without and with intravenous contrast. Angiographic images of the head were obtained using MRA technique without contrast. CONTRAST:  84m MULTIHANCE GADOBENATE DIMEGLUMINE 529 MG/ML IV SOLN COMPARISON:  CT HEAD May 13, 2016 FINDINGS: MRI HEAD FINDINGS INTRACRANIAL CONTENTS: Confluent medial RIGHT temporal occipital reduced diffusion with low ADC values and intermediate to bright T2 FLAIR signal. Scattered subcentimeter foci of reduced diffusion LEFT occipital lobe, bilateral cerebellum in addition to 16 x 9 mm focus  of reduced diffusion RIGHT cerebellum with low ADC value. At least 2 subcentimeter focus of reduced diffusion bifrontal lobes no susceptibility artifact is suggests intraparenchymal hemorrhage. Linear reduced diffusion RIGHT frontal extra-axial space with bright FLAIR signal and intrinsic T1 shortening. Ventricles and sulci are normal for patient's age. Minimal white matter changes most compatible with chronic small vessel ischemic disease are less than expected for age. No intraparenchymal masses, mass effect or abnormal enhancement. ORBITS: The included ocular globes and orbital contents are non-suspicious. SINUSES: The mastoid air-cells and included paranasal sinuses are well-aerated. SKULL/SOFT TISSUES: No abnormal sellar expansion. No suspicious calvarial bone marrow signal. Generalized bright T1 bone marrow signal most compatible with osteopenia. Craniocervical junction maintained. Sub cm probable lymph node RIGHT parotid gland. Patient is edentulous. MRA HEAD FINDINGS ANTERIOR CIRCULATION: Normal flow related enhancement of the included cervical, petrous, cavernous and supraclinoid internal carotid arteries. Patent anterior communicating artery. Normal flow related enhancement of the anterior and middle cerebral arteries, including distal segments. No large vessel occlusion, high-grade stenosis, abnormal luminal irregularity, aneurysm. POSTERIOR CIRCULATION: RIGHT vertebral artery is dominant. Basilar artery is patent, with normal flow related enhancement of the main branch vessels. Robust LEFT and small RIGHT posterior communicating arteries present. Occluded RIGHT proximal P2 segment. No high-grade stenosis, abnormal luminal irregularity, aneurysm. IMPRESSION: MRI HEAD: Acute large RIGHT posterior cerebral artery territory infarct (RIGHT temporal occipital lobe). Additional small acute infarcts bilateral cerebella, LEFT occipital lobe and bifrontal lobes. Findings most consistent with embolic phenomena.  Trace subacute RIGHT frontal subdural hematoma. No MR findings of intracranial metastasis. MRA HEAD: RIGHT posterior cerebral artery occlusion (proximal P2 segment). Otherwise negative MRA head. These results will be called to the ordering clinician or representative by the Radiologist Assistant, and communication documented in the PACS or zVision Dashboard. Electronically Signed   By: CElon AlasM.D.   On: 05/14/2016 22:27   ASSESSMENT / PLAN:  PULMONARY A:  Previous heavy smoker. Now with mediastinal adenopathy and lung mass diagnosed on CT 05/07/2016(pt has already established care with oncology Dr. EMarin Olp Incidental diagnosis  of pulmonary embolism and bilateral deep vein thrombosis 05/07/2016 Started on Apixaban (dc plavix) 05/08/2016 New admission for right posterior cerebral artery infarct 05/13/2016 Neurologically intact 05/14/2016 On schedule for bronchoscopy under anesthesia for 05/16/2016  P:   -05/14/16-MRI of Brain and spine showing acute large RIGHT posterior cerebral artery territory infarct (RIGHT temporal occipital lobe) Additional small acute infarcts bilateral cerebella, LEFT occipital lobe and bifrontal lobes. Findings most consistent with embolic phenomena. Trace subacute RIGHT frontal subdural hematoma. No findings of intracranial metastasis.  Will await neurology input  -Will likely need to hold off on Navigational Bronchoscopy at this time  -05/15/16-US bilateral lower extremities resulted bilateral extensive DVT -Recommend Palliative care consult to discuss goals of treatment     FAMILY  - Updates: Pt updated about plan of care and questions answered.

## 2016-05-15 NOTE — Clinical Documentation Improvement (Signed)
Internal Medicine Neurology  Can the diagnosis of altered mental status, confusion, inability to work crossword puzzles be further specified? Please document findings in next progress note. Thank you!   Transient alteration  Encephalopathy - if yes, is it acute, metabolic secondary to stroke, other type  Encephalopathy ruled out  Confusion as documented, meaning etiology still unclear  Other Condition  Clinically Undetermined  Document any associated diagnoses/conditions.  Supporting Information:  "Etiology for confusion is unclear, but possibly in part related to his acute stroke" Neuro Consult 05/14/16.   Please exercise your independent, professional judgment when responding. A specific answer is not anticipated or expected.  Thank You,  Norm Parcel RN, BSN, [place] Clinical Documentation Specialist [facility] [phone]; cell [phone]

## 2016-05-15 NOTE — Progress Notes (Signed)
ANTICOAGULATION CONSULT NOTE - Initial Consult  Pharmacy Consult for lovenox Indication: PE/DVT  No Known Allergies  Patient Measurements:   Heparin Dosing Weight:   Vital Signs: Temp: 98.3 F (36.8 C) (06/27 0951) Temp Source: Oral (06/27 0951) BP: 143/66 mmHg (06/27 0951) Pulse Rate: 57 (06/27 0951)  Labs:  Recent Labs  05/13/16 2246 05/14/16 1936 05/15/16 0821  HGB 8.5*  --  9.7*  HCT 26.7*  --  29.7*  PLT 283  --  312  APTT  --   --  113*  LABPROT 23.6*  --   --   INR 2.12*  --   --   HEPARINUNFRC  --  >2.20* >2.20*  CREATININE 1.43*  --  1.38*    Estimated Creatinine Clearance: 42.7 mL/min (by C-G formula based on Cr of 1.38).   Medical History: Past Medical History  Diagnosis Date  . Hypertension   . Prostate enlargement   . Peripheral vascular disease ([place]) 12/2015    non-healing ulcer right Hallux. S/P angioplasty and stenting of right common iliac 01/02/2016.  . Right foot pain 12/30/2015    Admitted to [facility] 12/11/2015, discharge 12/14/2015, diagnoses included acute right great toe infection/cellulitis, peripheral vascular disease. ABI demonstrated arterial occlusive disease worse on left. Is referred outpatient to vascular The CT scan documents for copy of his discharge summary. He should follow up with Dr. Ladona Horns, general surgery at [facility]. No dated 02/06/2  . Anemia 12/2015  . Colon polyps ~ 2001    unknown pathology, colonoscopy done at outside hospital.   . Heme positive stool 12/2015  . Gastrointestinal hemorrhage with melena     Status post EGD and blood transfusion March 2017   . Ischemic foot ulcer due to atherosclerosis of native artery of limb ([place]) 01/10/2016  . Cancer ([place])     lung    Medications:  Scheduled:  . atorvastatin  10 mg Oral q1800  . feeding supplement (ENSURE ENLIVE)  237 mL Oral BID BM  . finasteride  5 mg Oral Daily  . multivitamin with minerals  1 tablet Oral Daily  . omega-3 acid ethyl esters   1,000 mg Oral Daily  . pantoprazole  40 mg Oral Daily  . terazosin  10 mg Oral QHS  . vitamin B-12  1,000 mcg Oral Daily   Infusions:  . heparin 900 Units/hr (05/15/16 1038)    Assessment: 81 yo male with PE/DVT will be transitioned to lovenox from heparin.  SCr 1.38 (CrCl ~43); Hgb 9.7 and Plt 312 K  Goal of Therapy:  Anti-Xa level 0.6-1 units/ml 4hrs after LMWH dose given Monitor platelets by anticoagulation protocol: Yes   Plan:  - D/C heparin and heparin level - Start lovenox 70 mg sq q12h 1 hour after heparin is discontinued - CBC every 72 hours and monitor renal function  Jyden Kromer, Tsz-Yin 05/15/2016,4:54 PM

## 2016-05-15 NOTE — Assessment & Plan Note (Signed)
Believe that he is lesions will be best approached by endobronchial ultrasound and navigational bronchoscopy to the right upper lobe. I'll try to arrange this for next week. He will need to be admitted to the hospital for heparin bridging so that his Eliquis can be stopped prior to biopsy.

## 2016-05-15 NOTE — Telephone Encounter (Signed)
Contacted RB yesterday with pt's status RB responded that [facility] will see him in the hospital to decide course of action  Per pt's chart, MR consulted on pt 6.26.17 Called spoke with spouse Nothing further needed; will sign off

## 2016-05-15 NOTE — Progress Notes (Signed)
STROKE TEAM PROGRESS NOTE   HISTORY OF PRESENT ILLNESS (per record) Henry Ford is an 81 y.o. male with a history of hypertension, peripheral vascular disease, GI hemorrhage and recently diagnosed lung cancer, brought to the emergency room for evaluation of confusion and difficulty seeing on left side. Symptoms reportedly were first noted earlier today. Patient showed signs of confusion and also had difficulty working a Estate manager/land agent. He has no previous history of stroke nor TIA. He's been taking Eliquis for anticoagulation. CT scan of his head showed findings consistent with an acute right PCA territory ischemic infarction. No evidence of hemorrhage was seen. He was last known well at bedtime last night which was about 10:00 PM 05/12/2016. NIH stroke score was 4. Patient was not administered IV t-PA secondary to beyond time window for treatment consideration; on anticoagulation with Eliquis. He was admitted for further evaluation and treatment.   SUBJECTIVE (INTERVAL HISTORY) No family is at the bedside.  He is lying in the bed, alert and talkative. He Is aware of his diagnosis of DVT and need to undergo bronchoscopy and biopsy of lung lesion to determine lung cancer. Overall he feels his condition is stable.    OBJECTIVE Temp:  [98.2 F (36.8 C)-99.6 F (37.6 C)] 98.3 F (36.8 C) (06/27 0951) Pulse Rate:  [57-74] 57 (06/27 0951) Cardiac Rhythm:  [-] Normal sinus rhythm (06/27 0700) Resp:  [16-20] 20 (06/27 0951) BP: (122-151)/(39-66) 143/66 mmHg (06/27 0951) SpO2:  [95 %-97 %] 97 % (06/27 0951) Weight:  [156 lb (70.761 kg)] 156 lb (70.761 kg) (06/26 2053)  CBC:   Recent Labs Lab 05/13/16 2246 05/15/16 0821  WBC 13.2* 14.2*  HGB 8.5* 9.7*  HCT 26.7* 29.7*  MCV 89.0 90.3  PLT 283 193    Basic Metabolic Panel:   Recent Labs Lab 05/13/16 2246 05/15/16 0821  NA 140 139  K 4.0 4.0  CL 112* 108  CO2 20* 20*  GLUCOSE 89 101*  BUN 21* 16  CREATININE 1.43* 1.38*  CALCIUM  8.8* 8.9    Lipid Panel:     Component Value Date/Time   CHOL 152 05/14/2016 0216   TRIG 77 05/14/2016 0216   HDL 35* 05/14/2016 0216   CHOLHDL 4.3 05/14/2016 0216   VLDL 15 05/14/2016 0216   LDLCALC 102* 05/14/2016 0216   HgbA1c:  Lab Results  Component Value Date   HGBA1C 5.4 05/14/2016   Urine Drug Screen: No results found for: LABOPIA, COCAINSCRNUR, LABBENZ, AMPHETMU, THCU, LABBARB    IMAGING  Dg Chest 2 View  05/14/2016  CLINICAL DATA:  Follow-up stroke. History of metastatic lung cancer. EXAM: CHEST  2 VIEW COMPARISON:  Chest CT May 07, 2016 DINGS: Cardiac silhouette is normal and unchanged. Calcified aortic knob. Spiculated RIGHT hilar mass better seen on recent CT with scattered pulmonary nodules/metastasis. Scarring LEFT lung base. Increased lung volumes. No pleural effusion. No pneumothorax. Soft tissue planes and included osseous structures are nonsuspicious. Moderate chronic approximate T12 compression fracture. IMPRESSION: RIGHT hilar mass corresponding to known cancer with multiple pulmonary nodules/metastasis. COPD, no superimposed acute cardiopulmonary process. Electronically Signed   By: Elon Alas M.D.   On: 05/14/2016 22:08   Ct Head Wo Contrast  05/13/2016  CLINICAL DATA:  81 year old male with vision loss and altered mental status EXAM: CT HEAD WITHOUT CONTRAST TECHNIQUE: Contiguous axial images were obtained from the base of the skull through the vertex without intravenous contrast. COMPARISON:  None. FINDINGS: There is no acute intracranial hemorrhage. There is mild  to moderate age-related atrophy and chronic microvascular ischemic changes. There is a focal area of lower attenuation with loss of gray-white matter discrimination and effacement of the sulci in the right occipital lobe medially compatible with a right PCA territory infarct, likely acute or subacute. Clinical correlation is recommended. There is associated minimal mass effect on the adjacent  brain parenchyma. No midline shift. There is mild mucoperiosteal thickening of paranasal sinuses with partial opacification of the ethmoid air cells. No air-fluid level. The mastoid air cells are clear. The calvarium is intact. IMPRESSION: No acute intracranial hemorrhage. Findings compatible with right PCA territory infarct, likely acute or subacute. Correlation with clinical exam recommended. MRI may provide better evaluation. These results were called by telephone at the time of interpretation on 05/13/2016 at 11:28 pm to Dr. Wyvonnia Dusky, who verbally acknowledged these results. Electronically Signed   By: Anner Crete M.D.   On: 05/13/2016 23:33   Mr [name] WJ Contrast  05/14/2016  CLINICAL DATA:  Confusion, LEFT vision changes. Follow-up acute stroke. History of locally metastatic bronchogenic carcinoma, hypercoagulable state, hypertension. EXAM: MRI HEAD WITHOUT AND WITH CONTRAST MRA HEAD WITHOUT CONTRAST TECHNIQUE: Multiplanar, multiecho pulse sequences of the brain and surrounding structures were obtained without and with intravenous contrast. Angiographic images of the head were obtained using MRA technique without contrast. CONTRAST:  35m MULTIHANCE GADOBENATE DIMEGLUMINE 529 MG/ML IV SOLN COMPARISON:  CT HEAD May 13, 2016 FINDINGS: MRI HEAD FINDINGS INTRACRANIAL CONTENTS: Confluent medial RIGHT temporal occipital reduced diffusion with low ADC values and intermediate to bright T2 FLAIR signal. Scattered subcentimeter foci of reduced diffusion LEFT occipital lobe, bilateral cerebellum in addition to 16 x 9 mm focus of reduced diffusion RIGHT cerebellum with low ADC value. At least 2 subcentimeter focus of reduced diffusion bifrontal lobes no susceptibility artifact is suggests intraparenchymal hemorrhage. Linear reduced diffusion RIGHT frontal extra-axial space with bright FLAIR signal and intrinsic T1 shortening. Ventricles and sulci are normal for patient's age. Minimal white matter changes most  compatible with chronic small vessel ischemic disease are less than expected for age. No intraparenchymal masses, mass effect or abnormal enhancement. ORBITS: The included ocular globes and orbital contents are non-suspicious. SINUSES: The mastoid air-cells and included paranasal sinuses are well-aerated. SKULL/SOFT TISSUES: No abnormal sellar expansion. No suspicious calvarial bone marrow signal. Generalized bright T1 bone marrow signal most compatible with osteopenia. Craniocervical junction maintained. Sub cm probable lymph node RIGHT parotid gland. Patient is edentulous. MRA HEAD FINDINGS ANTERIOR CIRCULATION: Normal flow related enhancement of the included cervical, petrous, cavernous and supraclinoid internal carotid arteries. Patent anterior communicating artery. Normal flow related enhancement of the anterior and middle cerebral arteries, including distal segments. No large vessel occlusion, high-grade stenosis, abnormal luminal irregularity, aneurysm. POSTERIOR CIRCULATION: RIGHT vertebral artery is dominant. Basilar artery is patent, with normal flow related enhancement of the main branch vessels. Robust LEFT and small RIGHT posterior communicating arteries present. Occluded RIGHT proximal P2 segment. No high-grade stenosis, abnormal luminal irregularity, aneurysm. IMPRESSION: MRI HEAD: Acute large RIGHT posterior cerebral artery territory infarct (RIGHT temporal occipital lobe). Additional small acute infarcts bilateral cerebella, LEFT occipital lobe and bifrontal lobes. Findings most consistent with embolic phenomena. Trace subacute RIGHT frontal subdural hematoma. No MR findings of intracranial metastasis. MRA HEAD: RIGHT posterior cerebral artery occlusion (proximal P2 segment). Otherwise negative MRA head. These results will be called to the ordering clinician or representative by the Radiologist Assistant, and communication documented in the PACS or zVision Dashboard. Electronically Signed   By:  CSandie Ano  Bloomer M.D.   On: 05/14/2016 22:27   Mr Jodene Nam Head/brain Wo Cm  05/14/2016  CLINICAL DATA:  Confusion, LEFT vision changes. Follow-up acute stroke. History of locally metastatic bronchogenic carcinoma, hypercoagulable state, hypertension. EXAM: MRI HEAD WITHOUT AND WITH CONTRAST MRA HEAD WITHOUT CONTRAST TECHNIQUE: Multiplanar, multiecho pulse sequences of the brain and surrounding structures were obtained without and with intravenous contrast. Angiographic images of the head were obtained using MRA technique without contrast. CONTRAST:  39m MULTIHANCE GADOBENATE DIMEGLUMINE 529 MG/ML IV SOLN COMPARISON:  CT HEAD May 13, 2016 FINDINGS: MRI HEAD FINDINGS INTRACRANIAL CONTENTS: Confluent medial RIGHT temporal occipital reduced diffusion with low ADC values and intermediate to bright T2 FLAIR signal. Scattered subcentimeter foci of reduced diffusion LEFT occipital lobe, bilateral cerebellum in addition to 16 x 9 mm focus of reduced diffusion RIGHT cerebellum with low ADC value. At least 2 subcentimeter focus of reduced diffusion bifrontal lobes no susceptibility artifact is suggests intraparenchymal hemorrhage. Linear reduced diffusion RIGHT frontal extra-axial space with bright FLAIR signal and intrinsic T1 shortening. Ventricles and sulci are normal for patient's age. Minimal white matter changes most compatible with chronic small vessel ischemic disease are less than expected for age. No intraparenchymal masses, mass effect or abnormal enhancement. ORBITS: The included ocular globes and orbital contents are non-suspicious. SINUSES: The mastoid air-cells and included paranasal sinuses are well-aerated. SKULL/SOFT TISSUES: No abnormal sellar expansion. No suspicious calvarial bone marrow signal. Generalized bright T1 bone marrow signal most compatible with osteopenia. Craniocervical junction maintained. Sub cm probable lymph node RIGHT parotid gland. Patient is edentulous. MRA HEAD FINDINGS ANTERIOR  CIRCULATION: Normal flow related enhancement of the included cervical, petrous, cavernous and supraclinoid internal carotid arteries. Patent anterior communicating artery. Normal flow related enhancement of the anterior and middle cerebral arteries, including distal segments. No large vessel occlusion, high-grade stenosis, abnormal luminal irregularity, aneurysm. POSTERIOR CIRCULATION: RIGHT vertebral artery is dominant. Basilar artery is patent, with normal flow related enhancement of the main branch vessels. Robust LEFT and small RIGHT posterior communicating arteries present. Occluded RIGHT proximal P2 segment. No high-grade stenosis, abnormal luminal irregularity, aneurysm. IMPRESSION: MRI HEAD: Acute large RIGHT posterior cerebral artery territory infarct (RIGHT temporal occipital lobe). Additional small acute infarcts bilateral cerebella, LEFT occipital lobe and bifrontal lobes. Findings most consistent with embolic phenomena. Trace subacute RIGHT frontal subdural hematoma. No MR findings of intracranial metastasis. MRA HEAD: RIGHT posterior cerebral artery occlusion (proximal P2 segment). Otherwise negative MRA head. These results will be called to the ordering clinician or representative by the Radiologist Assistant, and communication documented in the PACS or zVision Dashboard. Electronically Signed   By: CElon AlasM.D.   On: 05/14/2016 22:27   2D Echocardiogram  - Left ventricle: The cavity size was mildly dilated. Systolic function was normal. The estimated ejection fraction was in the range of 55% to 60%. Wall motion was normal; there were no regional wall motion abnormalities. Features are consistent with a pseudonormal left ventricular filling pattern, with concomitant abnormal relaxation and increased filling pressure (grade 2 diastolic dysfunction). Doppler parameters are consistent with elevated ventricular end-diastolic filling pressure. - Aortic valve: Trileaflet; mildly thickened,  mildly calcified leaflets. There was mild regurgitation. - Aortic root: The aortic root was normal in size. - Mitral valve: Structurally normal valve. - Left atrium: The atrium was moderately dilated. - Right ventricle: Systolic function was normal. - Right atrium: The atrium was mildly dilated. - Tricuspid valve: There was mild regurgitation. - Pulmonic valve: There was trivial regurgitation. - Pulmonary arteries: Systolic  pressure was within the normal range. - Inferior vena cava: The vessel was dilated. The respirophasic diameter changes were blunted (< 50%), consistent with elevated central venous pressure. - Pericardium, extracardiac: There was no pericardial effusion.  Carotid Duplex Completed. Right ICA demonstrated a moderate amount of calcified plaque suggestive of a 60-79% stenosis. The left ICA demonstrated mild plaque with a 1-39% stenosis. Vertebral arteries demonstrated antegrade flow.  PHYSICAL EXAM Pleasant elderly  Caucasian male currently not in distress.   . Afebrile. Head is nontraumatic. Neck is supple without bruit.    Cardiac exam no murmur or gallop. Lungs are clear to auscultation. Distal pulses are well felt. Neurological Exam ;  Awake alert oriented to time place and person. No dysarthria or aphasia. Follows commands well. Pupils equal reactive. Extraocular moments full range without nystagmus. Fundi were not visualized. Dense left homonymous hemianopsia. Face is symmetric without weakness. Tongue is midline. Motor system exam revealed no upper or lower extremity drift. Symmetric and equal strength in all 4 extremities. No focal weakness. Sensation is intact. Gait was not tested. ASSESSMENT/PLAN Mr. Henry Ford is a 81 y.o. male with history of HTN, PVD, GIB and recently dx lung cancer presenting with new onset L visual field defect. He did not receive IV t-PA due to delay in arrival.   Stroke: Right posterior cerebral artery stroke,  likely embolic secondary to  hypercoagulable state from recent lung cancer in setting of B LE DVTs  Resultant  L field cut MRI  Acute large RIGHT posterior cerebral artery territory infarct (RIGHT temporal occipital lobe). Additional small acute infarcts bilateral cerebella, LEFT occipital lobe and bifrontal lobes. Findings most consistent with embolic phenomena. MRA  RIGHT posterior cerebral artery occlusion (proximal P2 segment).Otherwise negative MRA head.  Carotid Doppler  R 60-79% stenosis  2D Echo  EF 55-60%. No source of embolus   LDL 102  HgbA1c 5.4  eliquis for VTE prophylaxis Diet Heart Room service appropriate?: Yes; Fluid consistency:: Thin  Eliquis (apixaban) daily prior to admission, now on Eliquis (apixaban) daily  Patient counseled to be compliant with his antithrombotic medications  Ongoing aggressive stroke risk factor management  Therapy recommendations:  HH PT  Disposition:  Return home  DVT/PE  Due to hypercoagulability from lung cancer  on eliquis  Hypertension  Stable  Permissive hypertension (OK if < 220/120) but gradually normalize in 5-7 days  Long-term BP goal normotensive  Hyperlipidemia  Home meds:  No statin  LDL 102, goal < 70  Added lipitor 10  Continue statin at discharge  Other Stroke Risk Factors  Advanced age  Cigarette smoker, quit 3 months ago, encouraged to continue smoking cessation  ETOH use, advised to drink no more than 2 drink(s) a day  PVD  Other Active Problems  Bronchogenic carcinoma, recent dx  Protein caolorie malnutirion  CKD sgate III  BPH  normocytoic [facility] day # [place] for Pager information 05/15/2016 3:35 PM  I have personally examined this patient, reviewed notes, independently viewed imaging studies, participated in medical decision making and plan of care. I have made any additions or clarifications directly to the above note. Agree with note above. He  presented with left-sided vision difficulties due to right posterior cerebral artery infarct likely of hypercoagulability etiology from lung cancer despite being on anticoagulation with eliquis. The patient is in need of bronchoscopy and lung biopsy to confirm the diagnosis of lung cancer in order to plan definitive treatment.  I explained to the patient that he may need to come off temporarily anticoagulation to do the biopsy and this may place him at increased risk for DVT, pulmonary embolism and stroke recurrent strokes patient understands the risk and is willing to take it to get the definitive diagnosis of cancer. Recommend discontinuing eliquis and start IV heparin and hold it for 1 hour prior to the biopsy procedure and resume it after the procedure when safe. Discussed with Dr. Shanon Brow.  Greater than 50% time during this 25 minute visit was spent on counseling and coordination of care about stroke risk, prevention and treatment  Antony Contras, MD Medical Director [place] Pager: [phone] 05/15/2016 3:35 PM    To contact Stroke Continuity provider, please refer to [email or web address] After hours, contact General Neurology

## 2016-05-16 ENCOUNTER — Encounter: Payer: Self-pay | Admitting: Osteopathic Medicine

## 2016-05-16 ENCOUNTER — Other Ambulatory Visit: Payer: Self-pay | Admitting: Hematology & Oncology

## 2016-05-16 ENCOUNTER — Encounter (HOSPITAL_COMMUNITY): Admission: RE | Payer: Self-pay | Source: Ambulatory Visit

## 2016-05-16 DIAGNOSIS — R918 Other nonspecific abnormal finding of lung field: Secondary | ICD-10-CM

## 2016-05-16 DIAGNOSIS — I639 Cerebral infarction, unspecified: Secondary | ICD-10-CM

## 2016-05-16 DIAGNOSIS — I82403 Acute embolism and thrombosis of unspecified deep veins of lower extremity, bilateral: Secondary | ICD-10-CM

## 2016-05-16 DIAGNOSIS — Z789 Other specified health status: Secondary | ICD-10-CM | POA: Insufficient documentation

## 2016-05-16 LAB — CBC
HCT: 25.8 % — ABNORMAL LOW (ref 39.0–52.0)
Hemoglobin: 8.5 g/dL — ABNORMAL LOW (ref 13.0–17.0)
MCH: 29.2 pg (ref 26.0–34.0)
MCHC: 32.9 g/dL (ref 30.0–36.0)
MCV: 88.7 fL (ref 78.0–100.0)
PLATELETS: 299 10*3/uL (ref 150–400)
RBC: 2.91 MIL/uL — ABNORMAL LOW (ref 4.22–5.81)
RDW: 14.7 % (ref 11.5–15.5)
WBC: 12.9 10*3/uL — ABNORMAL HIGH (ref 4.0–10.5)

## 2016-05-16 LAB — FOLATE RBC
FOLATE, HEMOLYSATE: 534.1 ng/mL
Folate, RBC: 1769 ng/mL (ref >498)
Hematocrit: 30.2 % — ABNORMAL LOW (ref 37.5–51.0)

## 2016-05-16 SURGERY — VIDEO BRONCHOSCOPY WITH ENDOBRONCHIAL NAVIGATION
Anesthesia: General

## 2016-05-16 MED ORDER — ENOXAPARIN (LOVENOX) PATIENT EDUCATION KIT
PACK | Freq: Once | Status: DC
  Filled 2016-05-16: qty 1

## 2016-05-16 MED ORDER — ENOXAPARIN SODIUM 120 MG/0.8ML ~~LOC~~ SOLN: 106.0000 mg | Freq: Every evening | SUBCUTANEOUS | Status: DC

## 2016-05-16 NOTE — Care Management Note (Signed)
Case Management Note  Patient Details  Name: Henry Ford MRN: [id number] Date of Birth: [birth year]  Subjective/Objective:                    Action/Plan: Plan is to discharge patient home with lovenox SQ. Benefits check revealed: LOVENOX 70 MG EVERY 12 HR/  COVER- NOT COVER  PRIOR APPROVAL - YES # [id number] OR [phone]  CM will notify the MD.   Expected Discharge Date:                  Expected Discharge Plan:  [place]  In-House Referral:     Discharge planning Services     Post Acute Care Choice:    Choice offered to:     DME Arranged:    DME Agency:     HH Arranged:    [place] Agency:     Status of Service:  In process, will continue to follow  If discussed at Long Length of Stay Meetings, dates discussed:    Additional Comments:  Pollie Friar, RN 05/16/2016, 12:13 PM

## 2016-05-16 NOTE — Progress Notes (Signed)
Patient is being d/c home. D/c instructions given and patient verbalized understanding. 

## 2016-05-16 NOTE — Care Management Note (Signed)
Case Management Note  Patient Details  Name: Henry Ford MRN: [id number] Date of Birth: 10/05/1935  Subjective/Objective:                    Action/Plan: Pt discharging home today with orders for [facility] services. CM met with the patient and his wife and provided them a list of [facility] agencies in the [place] area. Mrs Younan chose [facility]. Butch Penny with [facility] notified and accepted the referral. Pt also ordered a rolling walker. Jermaine with [facility] DME notified and will deliver the equipment to the room. Pt going home on SQ lovenox. Pharmacy up to go over instructions and they provided a dose for tonight and Mrs Moure will be able to obtain his other doses from their pharmacy ([facility]) tomorrow. Cost is $497 per [place] and Mrs Rion states they will be able to purchase the lovenox. Bedside RN updated.   Expected Discharge Date:                  Expected Discharge Plan:  [place]  In-House Referral:     Discharge planning Services  CM Consult  Post Acute Care Choice:  Durable Medical Equipment, Home Health Choice offered to:  Patient, Spouse  DME Arranged:  Walker rolling DME Agency:  [place] Arranged:  RN, PT, OT, Speech Therapy [place] Agency:  [place]  Status of Service:  Completed, signed off  If discussed at [place] of Stay Meetings, dates discussed:    Additional Comments:  Pollie Friar, RN 05/16/2016, 2:28 PM

## 2016-05-16 NOTE — Progress Notes (Signed)
Pharmacy called and said they are out of Lovenox patient education kit. Will follow up with Case manager

## 2016-05-16 NOTE — Evaluation (Signed)
Occupational Therapy Evaluation Patient Details Name: Henry Ford MRN: [id number] DOB: 12/23/1934 Today's Date: 05/16/2016    History of Present Illness Lev Cervone is an unfortunate 81 y.o. male with medical history significant for hypertension, peripheral arterial disease status post stenting of right common iliac artery, anemia of chronic disease, and difficult recent course involving weight loss, diagnosis of lower extremity DVT and bilateral PE now on Eliquis, new diagnosis of bronchogenic carcinoma with local metastases referred oncology but not yet treated, now presenting to the emergency department with 2 days of left-sided visual field deficit, intermittent headaches, and 1 day of confusion and lethargy. Patient was just diagnosed with PE's bilaterally on 05/07/2016 and this study also revealed a suspected primary bronchogenic carcinoma in the right upper lobe with mediastinal adenopathy. MRI revealed R PCA infarct.   Clinical Impression   Session focused on awareness to visual field changes and affects on adls. Pt and wife educated on fall risk and safety upon d/c at home. Pt very discouraged by the multiple medical discoveries this admission and pending CA workup for possible lung CA. Pt adamant about return home and to his "normal" way of life. Pt pleased with compensatory strategies for reading provided.    Follow Up Recommendations  Home health OT    Equipment Recommendations  None recommended by OT    Recommendations for Other Services       Precautions / Restrictions Precautions Precautions: Fall Precaution Comments: L heminopsia      Mobility Bed Mobility Overal bed mobility: Needs Assistance Bed Mobility: Supine to Sit     Supine to sit: Min assist        Transfers Overall transfer level: Needs assistance   Transfers: Sit to/from Stand Sit to Stand: Min guard         General transfer comment: reaching for environmental supports due to vision  changes to make sure he is feeling and seeing all objects    Balance Overall balance assessment: Needs assistance         Standing balance support: No upper extremity supported;During functional activity Standing balance-Leahy Scale: Fair                              ADL Overall ADL's : Needs assistance/impaired Eating/Feeding: Set up;Sitting                       Toilet Transfer: Economist and Hygiene: Min guard       Functional mobility during ADLs: Min guard General ADL Comments: Pt educated on compensatory strategy for reading using finger and scanning L to R. pt with visual assessment to help bring awareness to BIL eye deficits. pt on arrival reports on L eye problem. pt with wife in front of patient and OT knocking out visual fields with finger tip occlusion. pt states "I can see my wife" Pts wife sitting with tongue out. pt unable to see this feature and when visual field allowed full access pt laughs in response to new visual input. OT points that patient is not seeing the full picture due to visual changes. pt asking directly about driving. Pt educated that return to driving is not advised at this time. pt very upset for a moment stating "give me a list of what I can do now. I can't drive I have cancer a stroke my vision and now no truck. I have lost  my truck" Ot attempting to be empathic with patient and pointing out all the life events patient can still do. Pt very upset about being "babysat" as patient reports for all task bathing dressing toileting. Pt allowed to ambulate to bath room with supervision this session. Pt allowed to have alarm off. Pt is to walk with staff present      Vision Vision Assessment?: Yes Eye Alignment: Within Functional Limits Ocular Range of Motion: Restricted on the left Alignment/Gaze Preference: Head tilt;Head turned Tracking/Visual Pursuits: Impaired - to be further tested  in functional context;Requires cues, head turns, or add eye shifts to track Visual Fields: Left visual field deficit;Impaired-to be further tested in functional context Additional Comments: Pt present with L visual field biocular visiual fields. pt would not be allowed to drive in the state of [place] based on these findings. Pt and wife educated that pt should not drive with out MD recommendation. Pt asking if this will return . pt advised that ability to gain visual field back is unlikely and shoudl follow up with eye doctor of his choosing within the next 3 months.    Perception     Praxis      Pertinent Vitals/Pain Pain Assessment: No/denies pain     Hand Dominance Right   Extremity/Trunk Assessment Upper Extremity Assessment Upper Extremity Assessment: Overall WFL for tasks assessed   Lower Extremity Assessment Lower Extremity Assessment: Overall WFL for tasks assessed   Cervical / Trunk Assessment Cervical / Trunk Assessment: Normal   Communication Communication Communication: HOH (wears hearing aid but refuses to let wife bring ti)   Cognition Arousal/Alertness: Awake/alert Behavior During Therapy: WFL for tasks assessed/performed Overall Cognitive Status: Impaired/Different from baseline Area of Impairment: Awareness           Awareness: Anticipatory   General Comments: pt verbalized changes in vision but session focused on affect of visual changes   General Comments       Exercises       Shoulder Instructions      Home Living Family/patient expects to be discharged to:: Private residence Living Arrangements: Spouse/significant other Available Help at Discharge: Family Type of Home: House Home Access: Stairs to enter Technical brewer of Steps: 1   Home Layout: Two level;Bed/bath upstairs Alternate Level Stairs-Number of Steps: 12 Alternate Level Stairs-Rails: Right;Left Bathroom Shower/Tub: Teacher, early years/pre: Standard     Home  Equipment: None      Lives With: Spouse    Prior Functioning/Environment Level of Independence: Independent        Comments: drives his truck and does yard work    OT Diagnosis: Generalized weakness;Disturbance of vision   OT Problem List: Decreased strength;Decreased activity tolerance;Impaired balance (sitting and/or standing);Impaired vision/perception;Decreased cognition;Decreased safety awareness;Decreased knowledge of use of DME or AE;Decreased knowledge of precautions   OT Treatment/Interventions: Self-care/ADL training;Therapeutic exercise;DME and/or AE instruction;Therapeutic activities;Cognitive remediation/compensation;Visual/perceptual remediation/compensation;Patient/family education;Balance training    OT Goals(Current goals can be found in the care plan section) Acute Rehab OT Goals Patient Stated Goal: to be able to do something and I am going home and doing. I am not during this bullshit here of being straped down OT Goal Formulation: With patient/family Time For Goal Achievement: 05/30/16 Potential to Achieve Goals: Good  OT Frequency: Min 2X/week   Barriers to D/C:            Co-evaluation              End of Session Nurse Communication: Mobility  status;Precautions  Activity Tolerance: Patient tolerated treatment well Patient left: in bed;with call bell/phone within reach;with family/visitor present   Time: 0300-9233 OT Time Calculation (min): 29 min Charges:  OT General Charges $OT Visit: 1 Procedure OT Evaluation $OT Eval Moderate Complexity: 1 Procedure OT Treatments $Self Care/Home Management : 8-22 mins G-Codes:    Peri Maris 2016-06-15, 2:40 PM  Jeri Modena   OTR/L Pager: [phone] Office: [phone] .

## 2016-05-16 NOTE — Progress Notes (Signed)
Progress note  Chart reviewed. Interviewed and examined patient. Patient denies complaints and is anxious to go home. He had been informed by the hospitalist yesterday that he would be ready for discharge home early this morning. Discussed patient's care with consultants (pulmonology, neurology and oncology). Made changes to discharge summary that had been done by Dr. Carles Collet yesterday. Called outpatient pharmacist to coordinate Lovenox beginning this evening. Discussed with inpatient pharmacy and case management. Arranged for home health services. Discussed in detail with patient's spouse at bedside.  Vernell Leep, MD, FACP, FHM. [facility] Pager [phone]  If 7PM-7AM, please contact night-coverage [email or web address] Password [email or web address] 05/16/2016, 1:56 PM

## 2016-05-16 NOTE — Discharge Instructions (Signed)
Enoxaparin injection What is this medicine? ENOXAPARIN (ee nox a PA rin) is used after knee, hip, or abdominal surgeries to prevent blood clotting. It is also used to treat existing blood clots in the lungs or in the veins. This medicine may be used for other purposes; ask your health care provider or pharmacist if you have questions. What should I tell my health care provider before I take this medicine? They need to know if you have any of these conditions: -bleeding disorders, hemorrhage, or hemophilia -infection of the heart or heart valves -kidney or liver disease -previous stroke -prosthetic heart valve -recent surgery or delivery of a baby -ulcer in the stomach or intestine, diverticulitis, or other bowel disease -an unusual or allergic reaction to enoxaparin, heparin, pork or pork products, other medicines, foods, dyes, or preservatives -pregnant or trying to get pregnant -breast-feeding How should I use this medicine? This medicine is for injection under the skin. It is usually given by a health-care professional. You or a family member may be trained on how to give the injections. If you are to give yourself injections, make sure you understand how to use the syringe, measure the dose if necessary, and give the injection. To avoid bruising, do not rub the site where this medicine has been injected. Do not take your medicine more often than directed. Do not stop taking except on the advice of your doctor or health care professional. Make sure you receive a puncture-resistant container to dispose of the needles and syringes once you have finished with them. Do not reuse these items. Return the container to your doctor or health care professional for proper disposal. Talk to your pediatrician regarding the use of this medicine in children. Special care may be needed. Overdosage: If you think you have taken too much of this medicine contact a poison control center or emergency room at  once. NOTE: This medicine is only for you. Do not share this medicine with others. What if I miss a dose? If you miss a dose, take it as soon as you can. If it is almost time for your next dose, take only that dose. Do not take double or extra doses. What may interact with this medicine? -aspirin and aspirin-like medicines -certain medicines that treat or prevent blood clots -dipyridamole -NSAIDs, medicines for pain and inflammation, like ibuprofen or naproxen This list may not describe all possible interactions. Give your health care provider a list of all the medicines, herbs, non-prescription drugs, or dietary supplements you use. Also tell them if you smoke, drink alcohol, or use illegal drugs. Some items may interact with your medicine. What should I watch for while using this medicine? Visit your doctor or health care professional for regular checks on your progress. Your condition will be monitored carefully while you are receiving this medicine. Notify your doctor or health care professional and seek emergency treatment if you develop breathing problems; changes in vision; chest pain; severe, sudden headache; pain, swelling, warmth in the leg; trouble speaking; sudden numbness or weakness of the face, arm, or leg. These can be signs that your condition has gotten worse. If you are going to have surgery, tell your doctor or health care professional that you are taking this medicine. Do not stop taking this medicine without first talking to your doctor. Be sure to refill your prescription before you run out of medicine. Avoid sports and activities that might cause injury while you are using this medicine. Severe falls or injuries can  cause unseen bleeding. Be careful when using sharp tools or knives. Consider using an Copy. Take special care brushing or flossing your teeth. Report any injuries, bruising, or red spots on the skin to your doctor or health care professional. What side  effects may I notice from receiving this medicine? Side effects that you should report to your doctor or health care professional as soon as possible: -allergic reactions like skin rash, itching or hives, swelling of the face, lips, or tongue -feeling faint or lightheaded, falls -signs and symptoms of bleeding such as bloody or black, tarry stools; red or dark-brown urine; spitting up blood or brown material that looks like coffee grounds; red spots on the skin; unusual bruising or bleeding from the eye, gums, or nose Side effects that usually do not require medical attention (report to your doctor or health care professional if they continue or are bothersome): -pain, redness, or irritation at site where injected This list may not describe all possible side effects. Call your doctor for medical advice about side effects. You may report side effects to FDA at 1-800-FDA-1088. Where should I keep my medicine? Keep out of the reach of children. Store at room temperature between 15 and 30 degrees C (59 and 86 degrees F). Do not freeze. If your injections have been specially prepared, you may need to store them in the refrigerator. Ask your pharmacist. Throw away any unused medicine after the expiration date. NOTE: This sheet is a summary. It may not cover all possible information. If you have questions about this medicine, talk to your doctor, pharmacist, or health care provider.    2016, Elsevier/Gold Standard. (2014-03-09 16:06:21)   Stroke Prevention Some medical conditions and behaviors are associated with an increased chance of having a stroke. You may prevent a stroke by making healthy choices and managing medical conditions. HOW CAN I REDUCE MY RISK OF HAVING A STROKE?   Stay physically active. Get at least 30 minutes of activity on most or all days.  Do not smoke. It may also be helpful to avoid exposure to secondhand smoke.  Limit alcohol use. Moderate alcohol use is considered to  be:  No more than 2 drinks per day for men.  No more than 1 drink per day for nonpregnant women.  Eat healthy foods. This involves:  Eating 5 or more servings of fruits and vegetables a day.  Making dietary changes that address high blood pressure (hypertension), high cholesterol, diabetes, or obesity.  Manage your cholesterol levels.  Making food choices that are high in fiber and low in saturated fat, trans fat, and cholesterol may control cholesterol levels.  Take any prescribed medicines to control cholesterol as directed by your health care provider.  Manage your diabetes.  Controlling your carbohydrate and sugar intake is recommended to manage diabetes.  Take any prescribed medicines to control diabetes as directed by your health care provider.  Control your hypertension.  Making food choices that are low in salt (sodium), saturated fat, trans fat, and cholesterol is recommended to manage hypertension.  Ask your health care provider if you need treatment to lower your blood pressure. Take any prescribed medicines to control hypertension as directed by your health care provider.  If you are 25-60 years of age, have your blood pressure checked every 3-5 years. If you are 63 years of age or older, have your blood pressure checked every year.  Maintain a healthy weight.  Reducing calorie intake and making food choices that are low  in sodium, saturated fat, trans fat, and cholesterol are recommended to manage weight.  Stop drug abuse.  Avoid taking birth control pills.  Talk to your health care provider about the risks of taking birth control pills if you are over 38 years old, smoke, get migraines, or have ever had a blood clot.  Get evaluated for sleep disorders (sleep apnea).  Talk to your health care provider about getting a sleep evaluation if you snore a lot or have excessive sleepiness.  Take medicines only as directed by your health care provider.  For some  people, aspirin or blood thinners (anticoagulants) are helpful in reducing the risk of forming abnormal blood clots that can lead to stroke. If you have the irregular heart rhythm of atrial fibrillation, you should be on a blood thinner unless there is a good reason you cannot take them.  Understand all your medicine instructions.  Make sure that other conditions (such as anemia or atherosclerosis) are addressed. SEEK IMMEDIATE MEDICAL CARE IF:   You have sudden weakness or numbness of the face, arm, or leg, especially on one side of the body.  Your face or eyelid droops to one side.  You have sudden confusion.  You have trouble speaking (aphasia) or understanding.  You have sudden trouble seeing in one or both eyes.  You have sudden trouble walking.  You have dizziness.  You have a loss of balance or coordination.  You have a sudden, severe headache with no known cause.  You have new chest pain or an irregular heartbeat. Any of these symptoms may represent a serious problem that is an emergency. Do not wait to see if the symptoms will go away. Get medical help at once. Call your local emergency services (911 in [place] Do not drive yourself to the hospital.   This information is not intended to replace advice given to you by your health care provider. Make sure you discuss any questions you have with your health care provider.   Document Released: 12/13/2004 Document Revised: 11/26/2014 Document Reviewed: 05/08/2013 Elsevier Interactive Patient Education [employer].

## 2016-05-16 NOTE — Care Management Important Message (Signed)
Important Message  Patient Details  Name: Cartrell Bentsen MRN: [id number] Date of Birth: [birth year]   Medicare Important Message Given:  Yes    [name] 05/16/2016, 10:09 AM

## 2016-05-16 NOTE — Progress Notes (Signed)
ANTICOAGULATION CONSULT NOTE  Pharmacy Consult for lovenox Indication: PE/DVT  No Known Allergies  Vital Signs: Temp: 97.8 F (36.6 C) (06/28 0934) Temp Source: Oral (06/28 0934) BP: 139/51 mmHg (06/28 0934) Pulse Rate: 75 (06/28 0934)  Labs:  Recent Labs  05/13/16 2246 05/14/16 1936 05/15/16 0821 05/16/16 0638  HGB 8.5*  --  9.7* 8.5*  HCT 26.7*  --  29.7* 25.8*  PLT 283  --  312 299  APTT  --   --  113*  --   LABPROT 23.6*  --   --   --   INR 2.12*  --   --   --   HEPARINUNFRC  --  >2.20* >2.20*  --   CREATININE 1.43*  --  1.38*  --     Estimated Creatinine Clearance: 42.7 mL/min (by C-G formula based on Cr of 1.38).   Medical History: Past Medical History  Diagnosis Date  . Hypertension   . Prostate enlargement   . Peripheral vascular disease ([place]) 12/2015    non-healing ulcer right Hallux. S/P angioplasty and stenting of right common iliac 01/02/2016.  . Right foot pain 12/30/2015    Admitted to [facility] 12/11/2015, discharge 12/14/2015, diagnoses included acute right great toe infection/cellulitis, peripheral vascular disease. ABI demonstrated arterial occlusive disease worse on left. Is referred outpatient to vascular The CT scan documents for copy of his discharge summary. He should follow up with Dr. Ladona Horns, general surgery at [facility]. No dated 02/06/2  . Anemia 12/2015  . Colon polyps ~ 2001    unknown pathology, colonoscopy done at outside hospital.   . Heme positive stool 12/2015  . Gastrointestinal hemorrhage with melena     Status post EGD and blood transfusion March 2017   . 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 Cancer ([place])     lung    Medications:  Scheduled:  . atorvastatin  10 mg Oral q1800  . enoxaparin (LOVENOX) injection  70 mg Subcutaneous Q12H  . feeding supplement (ENSURE ENLIVE)  237 mL Oral BID BM  . finasteride  5 mg Oral Daily  . multivitamin with minerals  1  tablet Oral Daily  . omega-3 acid ethyl esters  1,000 mg Oral Daily  . pantoprazole  40 mg Oral Daily  . terazosin  10 mg Oral QHS  . vitamin B-12  1,000 mcg Oral Daily   Infusions:     Assessment: 81 yo male with PE/DVT was transitioned from apixaban>heparin> Lovenox.   SCr 1.38 (CrCl ~43); H/H down a bit today. Hgb 9.7>8.5 and Plt 312>299.   Per Dr. Carles Collet discharge summary, would like to discharge on q 24 hr Lovenox dosing. **If renal function remains stable with CrCl > 30, could discharge on Lovenox 1.5 mg/kg q 24 hours with 4 hour anti-xa level goal of 1-2 un/mL.  Goal of Therapy:  Anti-Xa level 0.6-1 units/ml 4hrs after LMWH dose given Monitor platelets by anticoagulation protocol: Yes   Plan:  - Continue lovenox 70 mg sq q12h today - Monitor renal function - Consider daily monitor H/H with trend down today   Thank you for allowing [place] to participate in this patients care. Jens Som, PharmD Pager: [phone]  05/16/2016,9:38 AM

## 2016-05-16 NOTE — Progress Notes (Signed)
STROKE TEAM PROGRESS NOTE   HISTORY OF PRESENT ILLNESS (per record) Henry Ford is an 81 y.o. male with a history of hypertension, peripheral vascular disease, GI hemorrhage and recently diagnosed lung cancer, brought to the emergency room for evaluation of confusion and difficulty seeing on left side. Symptoms reportedly were first noted earlier today. Patient showed signs of confusion and also had difficulty working a Estate manager/land agent. He has no previous history of stroke nor TIA. He's been taking Eliquis for anticoagulation. CT scan of his head showed findings consistent with an acute right PCA territory ischemic infarction. No evidence of hemorrhage was seen. He was last known well at bedtime last night which was about 10:00 PM 05/12/2016. NIH stroke score was 4. Patient was not administered IV t-PA secondary to beyond time window for treatment consideration; on anticoagulation with Eliquis. He was admitted for further evaluation and treatment.   SUBJECTIVE (INTERVAL HISTORY) His wife is at the bedside.  He is lying in the bed, alert and talkative. He Is aware of his diagnosis of DVT and need to undergo bronchoscopy and biopsy of lung lesion to determine lung cancer but states this is now postponed.``````````````````````````````````````````````````````````````````````````````````````````````````````````````````````````````````````````````````````````````````````````````````````````````````````````````````````````````````````. Overall he feels his condition is stable.    OBJECTIVE Temp:  [97.8 F (36.6 C)-99.2 F (37.3 C)] 97.8 F (36.6 C) (06/28 0934) Pulse Rate:  [62-78] 75 (06/28 0934) Cardiac Rhythm:  [-] Normal sinus rhythm (06/28 0731) Resp:  [18-20] 20 (06/28 0934) BP: (124-148)/(46-65) 139/51 mmHg (06/28 0934) SpO2:  [94 %-100 %] 96 % (06/28 0934)  CBC:   Recent Labs Lab 05/15/16 0821 05/16/16 0638  WBC 14.2* 12.9*  HGB 9.7* 8.5*  HCT 29.7*  30.2* 25.8*  MCV 90.3 88.7   PLT 312 812    Basic Metabolic Panel:   Recent Labs Lab 05/13/16 2246 05/15/16 0821  NA 140 139  K 4.0 4.0  CL 112* 108  CO2 20* 20*  GLUCOSE 89 101*  BUN 21* 16  CREATININE 1.43* 1.38*  CALCIUM 8.8* 8.9    Lipid Panel:     Component Value Date/Time   CHOL 152 05/14/2016 0216   TRIG 77 05/14/2016 0216   HDL 35* 05/14/2016 0216   CHOLHDL 4.3 05/14/2016 0216   VLDL 15 05/14/2016 0216   LDLCALC 102* 05/14/2016 0216   HgbA1c:  Lab Results  Component Value Date   HGBA1C 5.4 05/14/2016   Urine Drug Screen: No results found for: LABOPIA, COCAINSCRNUR, LABBENZ, AMPHETMU, THCU, LABBARB    IMAGING  Dg Chest 2 View  05/14/2016  CLINICAL DATA:  Follow-up stroke. History of metastatic lung cancer. EXAM: CHEST  2 VIEW COMPARISON:  Chest CT May 07, 2016 DINGS: Cardiac silhouette is normal and unchanged. Calcified aortic knob. Spiculated RIGHT hilar mass better seen on recent CT with scattered pulmonary nodules/metastasis. Scarring LEFT lung base. Increased lung volumes. No pleural effusion. No pneumothorax. Soft tissue planes and included osseous structures are nonsuspicious. Moderate chronic approximate T12 compression fracture. IMPRESSION: RIGHT hilar mass corresponding to known cancer with multiple pulmonary nodules/metastasis. COPD, no superimposed acute cardiopulmonary process. Electronically Signed   By: Elon Alas M.D.   On: 05/14/2016 22:08   Mr Henry Ford XN Contrast  05/14/2016  CLINICAL DATA:  Confusion, LEFT vision changes. Follow-up acute stroke. History of locally metastatic bronchogenic carcinoma, hypercoagulable state, hypertension. EXAM: MRI HEAD WITHOUT AND WITH CONTRAST MRA HEAD WITHOUT CONTRAST TECHNIQUE: Multiplanar, multiecho pulse sequences of the brain and surrounding structures were obtained without and with intravenous contrast. Angiographic images of  the head were obtained using MRA technique without contrast. CONTRAST:  71m MULTIHANCE GADOBENATE  DIMEGLUMINE 529 MG/ML IV SOLN COMPARISON:  CT HEAD May 13, 2016 FINDINGS: MRI HEAD FINDINGS INTRACRANIAL CONTENTS: Confluent medial RIGHT temporal occipital reduced diffusion with low ADC values and intermediate to bright T2 FLAIR signal. Scattered subcentimeter foci of reduced diffusion LEFT occipital lobe, bilateral cerebellum in addition to 16 x 9 mm focus of reduced diffusion RIGHT cerebellum with low ADC value. At least 2 subcentimeter focus of reduced diffusion bifrontal lobes no susceptibility artifact is suggests intraparenchymal hemorrhage. Linear reduced diffusion RIGHT frontal extra-axial space with bright FLAIR signal and intrinsic T1 shortening. Ventricles and sulci are normal for patient's age. Minimal white matter changes most compatible with chronic small vessel ischemic disease are less than expected for age. No intraparenchymal masses, mass effect or abnormal enhancement. ORBITS: The included ocular globes and orbital contents are non-suspicious. SINUSES: The mastoid air-cells and included paranasal sinuses are well-aerated. SKULL/SOFT TISSUES: No abnormal sellar expansion. No suspicious calvarial bone marrow signal. Generalized bright T1 bone marrow signal most compatible with osteopenia. Craniocervical junction maintained. Sub cm probable lymph node RIGHT parotid gland. Patient is edentulous. MRA HEAD FINDINGS ANTERIOR CIRCULATION: Normal flow related enhancement of the included cervical, petrous, cavernous and supraclinoid internal carotid arteries. Patent anterior communicating artery. Normal flow related enhancement of the anterior and middle cerebral arteries, including distal segments. No large vessel occlusion, high-grade stenosis, abnormal luminal irregularity, aneurysm. POSTERIOR CIRCULATION: RIGHT vertebral artery is dominant. Basilar artery is patent, with normal flow related enhancement of the main branch vessels. Robust LEFT and small RIGHT posterior communicating arteries present.  Occluded RIGHT proximal P2 segment. No high-grade stenosis, abnormal luminal irregularity, aneurysm. IMPRESSION: MRI HEAD: Acute large RIGHT posterior cerebral artery territory infarct (RIGHT temporal occipital lobe). Additional small acute infarcts bilateral cerebella, LEFT occipital lobe and bifrontal lobes. Findings most consistent with embolic phenomena. Trace subacute RIGHT frontal subdural hematoma. No MR findings of intracranial metastasis. MRA HEAD: RIGHT posterior cerebral artery occlusion (proximal P2 segment). Otherwise negative MRA head. These results will be called to the ordering clinician or representative by the Radiologist Assistant, and communication documented in the PACS or zVision Dashboard. Electronically Signed   By: CElon AlasM.D.   On: 05/14/2016 22:27   Mr MJodene NamHead/brain Wo Cm  05/14/2016  CLINICAL DATA:  Confusion, LEFT vision changes. Follow-up acute stroke. History of locally metastatic bronchogenic carcinoma, hypercoagulable state, hypertension. EXAM: MRI HEAD WITHOUT AND WITH CONTRAST MRA HEAD WITHOUT CONTRAST TECHNIQUE: Multiplanar, multiecho pulse sequences of the brain and surrounding structures were obtained without and with intravenous contrast. Angiographic images of the head were obtained using MRA technique without contrast. CONTRAST:  12mMULTIHANCE GADOBENATE DIMEGLUMINE 529 MG/ML IV SOLN COMPARISON:  CT HEAD May 13, 2016 FINDINGS: MRI HEAD FINDINGS INTRACRANIAL CONTENTS: Confluent medial RIGHT temporal occipital reduced diffusion with low ADC values and intermediate to bright T2 FLAIR signal. Scattered subcentimeter foci of reduced diffusion LEFT occipital lobe, bilateral cerebellum in addition to 16 x 9 mm focus of reduced diffusion RIGHT cerebellum with low ADC value. At least 2 subcentimeter focus of reduced diffusion bifrontal lobes no susceptibility artifact is suggests intraparenchymal hemorrhage. Linear reduced diffusion RIGHT frontal extra-axial space  with bright FLAIR signal and intrinsic T1 shortening. Ventricles and sulci are normal for patient's age. Minimal white matter changes most compatible with chronic small vessel ischemic disease are less than expected for age. No intraparenchymal masses, mass effect or abnormal enhancement. ORBITS: The included ocular globes  and orbital contents are non-suspicious. SINUSES: The mastoid air-cells and included paranasal sinuses are well-aerated. SKULL/SOFT TISSUES: No abnormal sellar expansion. No suspicious calvarial bone marrow signal. Generalized bright T1 bone marrow signal most compatible with osteopenia. Craniocervical junction maintained. Sub cm probable lymph node RIGHT parotid gland. Patient is edentulous. MRA HEAD FINDINGS ANTERIOR CIRCULATION: Normal flow related enhancement of the included cervical, petrous, cavernous and supraclinoid internal carotid arteries. Patent anterior communicating artery. Normal flow related enhancement of the anterior and middle cerebral arteries, including distal segments. No large vessel occlusion, high-grade stenosis, abnormal luminal irregularity, aneurysm. POSTERIOR CIRCULATION: RIGHT vertebral artery is dominant. Basilar artery is patent, with normal flow related enhancement of the main branch vessels. Robust LEFT and small RIGHT posterior communicating arteries present. Occluded RIGHT proximal P2 segment. No high-grade stenosis, abnormal luminal irregularity, aneurysm. IMPRESSION: MRI HEAD: Acute large RIGHT posterior cerebral artery territory infarct (RIGHT temporal occipital lobe). Additional small acute infarcts bilateral cerebella, LEFT occipital lobe and bifrontal lobes. Findings most consistent with embolic phenomena. Trace subacute RIGHT frontal subdural hematoma. No MR findings of intracranial metastasis. MRA HEAD: RIGHT posterior cerebral artery occlusion (proximal P2 segment). Otherwise negative MRA head. These results will be called to the ordering clinician or  representative by the Radiologist Assistant, and communication documented in the PACS or zVision Dashboard. Electronically Signed   By: Elon Alas M.D.   On: 05/14/2016 22:27   2D Echocardiogram  - Left ventricle: The cavity size was mildly dilated. Systolic function was normal. The estimated ejection fraction was in the range of 55% to 60%. Wall motion was normal; there were no regional wall motion abnormalities. Features are consistent with a pseudonormal left ventricular filling pattern, with concomitant abnormal relaxation and increased filling pressure (grade 2 diastolic dysfunction). Doppler parameters are consistent with elevated ventricular end-diastolic filling pressure. - Aortic valve: Trileaflet; mildly thickened, mildly calcified leaflets. There was mild regurgitation. - Aortic root: The aortic root was normal in size. - Mitral valve: Structurally normal valve. - Left atrium: The atrium was moderately dilated. - Right ventricle: Systolic function was normal. - Right atrium: The atrium was mildly dilated. - Tricuspid valve: There was mild regurgitation. - Pulmonic valve: There was trivial regurgitation. - Pulmonary arteries: Systolic pressure was within the normal range. - Inferior vena cava: The vessel was dilated. The respirophasic diameter changes were blunted (< 50%), consistent with elevated central venous pressure. - Pericardium, extracardiac: There was no pericardial effusion.  Carotid Duplex Completed. Right ICA demonstrated a moderate amount of calcified plaque suggestive of a 60-79% stenosis. The left ICA demonstrated mild plaque with a 1-39% stenosis. Vertebral arteries demonstrated antegrade flow.  PHYSICAL EXAM Pleasant elderly  Caucasian male currently not in distress.   . Afebrile. Head is nontraumatic. Neck is supple without bruit.    Cardiac exam no murmur or gallop. Lungs are clear to auscultation. Distal pulses are well felt. Neurological Exam ;  Awake alert  oriented to time place and person. No dysarthria or aphasia. Follows commands well. Pupils equal reactive. Extraocular moments full range without nystagmus. Fundi were not visualized. Dense left homonymous hemianopsia. Face is symmetric without weakness. Tongue is midline. Motor system exam revealed no upper or lower extremity drift. Symmetric and equal strength in all 4 extremities. No focal weakness. Sensation is intact. Gait was not tested. ASSESSMENT/PLAN Mr. Henry Ford is a 81 y.o. male with history of HTN, PVD, GIB and recently dx lung cancer presenting with new onset L visual field defect. He did not receive IV  t-PA due to delay in arrival.   Stroke: Right posterior cerebral artery stroke,  likely embolic secondary to hypercoagulable state from recent lung cancer in setting of B LE DVTs  Resultant  L field cut MRI  Acute large RIGHT posterior cerebral artery territory infarct (RIGHT temporal occipital lobe). Additional small acute infarcts bilateral cerebella, LEFT occipital lobe and bifrontal lobes. Findings most consistent with embolic phenomena. MRA  RIGHT posterior cerebral artery occlusion (proximal P2 segment).Otherwise negative MRA head.  Carotid Doppler  R 60-79% stenosis  2D Echo  EF 55-60%. No source of embolus   LDL 102  HgbA1c 5.4  eliquis for VTE prophylaxis Diet Heart Room service appropriate?: Yes; Fluid consistency:: Thin Diet - low sodium heart healthy  Eliquis (apixaban) daily prior to admission, now on Eliquis (apixaban) daily  Patient counseled to be compliant with his antithrombotic medications  Ongoing aggressive stroke risk factor management  Therapy recommendations:  HH PT  Disposition:  Return home  DVT/PE  Due to hypercoagulability from lung cancer  on eliquis  Hypertension  Stable  Permissive hypertension (OK if < 220/120) but gradually normalize in 5-7 days  Long-term BP goal normotensive  Hyperlipidemia  Home meds:  No  statin  LDL 102, goal < 70  Added lipitor 10  Continue statin at discharge  Other Stroke Risk Factors  Advanced age  Cigarette smoker, quit 3 months ago, encouraged to continue smoking cessation  ETOH use, advised to drink no more than 2 drink(s) a day  PVD  Other Active Problems  Bronchogenic carcinoma, recent dx  Protein caolorie malnutirion  CKD sgate III  BPH  normocytoic [facility] day # [place] for Pager information 05/16/2016 1:56 PM  I have personally examined this patient, reviewed notes, independently viewed imaging studies, participated in medical decision making and plan of care. I have made any additions or clarifications directly to the above note. Agree with note above. He presented with left-sided vision difficulties due to right posterior cerebral artery infarct likely of hypercoagulability etiology from lung cancer despite being on anticoagulation with eliquis. The patient is in need of bronchoscopy and lung biopsy to confirm the diagnosis of lung cancer in order to plan definitive treatment. I explained to the patient that he may need to come off temporarily anticoagulation to do the biopsy and this may place him at increased risk for DVT, pulmonary embolism and stroke recurrent strokes patient understands the risk and is willing to take it to get the definitive diagnosis of cancer. Recommend discontinuing eliquis and start IV heparin and hold it for 1 hour prior to the biopsy procedure and resume it after the procedure when safe.  .  Greater than 50% time during this 15 minute visit was spent on counseling and coordination of care about stroke risk, prevention and treatment. Stroke team will sign off. Kindly call for questions.  Antony Contras, MD Medical Director [facility] Pager: [phone] 05/16/2016 1:56 PM    To contact Stroke Continuity provider, please refer to [email or web address] After  hours, contact General Neurology

## 2016-05-16 NOTE — Discharge Summary (Signed)
Physician Discharge Summary  [place] PNT:[id number] DOB: 02-22-1935 DOA: 05/13/2016  PCP: [name], DO  Admit date: 05/13/2016 Discharge date: 05/16/2016  Admitted From: Home Disposition:  Home  Recommendations for Outpatient Follow-up:  1. Dr. [name], PCP in 5 days with repeat labs (CBC & BMP). 2. Dr. Burney Gauze, Oncology in 1 week. 3. Dr. Baltazar Apo, Pulmonology in 2 weeks. 4. Dr. Antony Contras, Neurology in 2 months.   Home Health:PT/OT/SLP/RN Equipment/Devices: Rolling walker with 5 inch wheels  Discharge Condition: Stable CODE STATUS: FULL Diet recommendation: Heart Healthy  Brief/Interim Summary: 81 year old male with a history of hypertension, peripheral vascular disease status post right iliac stent, DVT of LE, PE, and newly diagnosed bronchogenic carcinoma on 05/07/16. His lung cancer was clinically diagnosed on the CTA of the chest that was obtained on 05/07/16 when he had an incidental finding of right upper lobe spiculated lesion measuring 3.2 x 2.68 m with numerous other pulmonary nodules with associated mediastinal lymph adenopathy. The patient was started on apixaban on 05/08/2016. Venous duplex of the lower extremity showed bilateral DVT on 05/08/2016. The patient was seen by medical oncology, Dr. Marin Olp on 05/10/2016 whom recommended outpatient PET scan, MR brain and tissue biopsy. The patient was set up to see Dr. Lamonte Sakai for possible bronchoscopy with bx.Unfortunately, the patient developed a two-day history of left visual field deficits and headache associated with confusion and lethargy. As a result, the patient was brought to emergency department on 05/13/2016 for further evaluation. CT of the brain showed an acute versus subacute right PCA infarct.The pt was admitted for full stroke work up and Neurology was consulted  Discharge Diagnoses:  Right PCA Stroke -Neurology Consulted -PT/OT evaluation--home health PT -Speech therapy eval -  recommend outpatient follow-up by speech therapy. -CT brain--acute versus subacute right PCA infarct -MRI-large R-PCA infarct; Also infarcts involving bilateral cerebella, bilateral frontal and L-occipital; trace R-SDH likely due to embolic phenomena; no mets -MRA brain-R-PCA occlusion  -Carotid Duplex--Right ICA demonstrated a moderate amount of calcified plaque suggestive of a 60-79% stenosis;  L-ICA 1-39 % -Echo--EF 55-60%, grade 2 DD, no WMA, no emboli -LDL--102--Lipitor 10 mg daily started -HbA1C--5.4 -apixaban switched to full dose Lovenox - As per neurology, patient developed right posterior cerebral artery infarct likely due to hypercoagulability from lung cancer despite being on anticoagulation with eliquis prior to admission. - Discussed with Dr. Leonie Man, Neurology who recommended continuing full dose Lovenox at discharge and outpatient follow-up with him in 2 months.  DVT bilateral legs, PE  - PE Diagnosed 05/07/16 - Likely hypercoagulable state d/t newly identified lung ca  -05/13/2016 EKG--sinus rhythm, nonspecific ST changes -05/15/16-leg duplex--bilateral extensive DVT--previously known from duplex on 05/08/16 - As per oncology, patient was already on Eliquis and developed a cerebral thrombus and hence recommended changing anticoagulation to full dose Lovenox at discharge. Prescription had initially been called by yesterday's discharging M.D. for Lovenox 100 mg subcutaneous every 12 hours. After discussing with pharmacy, changed Lovenox to 106 mg every 24 hours beginning this evening (changed to once daily dosing for convenience based on patient/family preference). I personally called and confirmed receipt of this prescription by patient's outpatient pharmacist who will arrange for this medication beginning this evening. Discussed with RN who will educate patient and family regarding self administration of Lovenox at home. As per discussion with Dr. Carles Collet, patient's daughter will be  giving the shot.  Bronchogenic carcinoma  - Identified on CT from 6/19 with apparent primary in RUL and mediastinal adenopathy  -  Has established with oncology, Dr. Marin Olp, and has been referred for tissue biopsy  - Ongoing management per oncology and pulmonology -05/15/16-Bronchoscopy CANCELLED by pulmonary--to delay until outpt setting -follow up with pulmonary after d/c. Discussed with Dr. Elsworth Soho, Pulmonology who recommended outpatient follow-up with Dr. Lamonte Sakai in 2 weeks to pursue further cancer workup.  Acute metabolic encephalopathy -due to embolic stroke - Resolved.  Moderate Protein-calorie malnutrition  - Serum albumin 2.8; patient appears cachectic with bitemporal wasting  - Dietary consultation requested-->continue ensure  CKD stage III  - SCr 1.43 at time of admission, consistent with his apparent baseline  - Avoid nephrotoxins, renally-dose medications  -baseline 1.3-1.5 -stable. Follow BMP as outpatient with PCP in the next few days.  HTN -stable -continue Hytrin -d/c losartan in setting of CKD -BP remains stable and acceptable off losartan during the hospitalization -f/u PCP for future adjustments.  BPH  - Stable, continue current management with finasteride and terazosin   Normocytic anemia  - Hgb 8.5 on admission, slightly down from recent priors in 9-10 range  - No obvious bleeding; pt denies melena or hematochezia  - Monitor with periodic CBC--Hgb stable. Outpatient follow-up with repeat CBCs with PCP in the next few days.  CODE STATUS - Patient is listed as full code in [facility]. This discussion can be further had during outpatient follow-up with PCP/oncology.   Discharge Instructions      Discharge Instructions    Call MD for:  difficulty breathing, headache or visual disturbances    Complete by:  As directed      Call MD for:  extreme fatigue    Complete by:  As directed      Call MD for:  persistant dizziness or light-headedness     Complete by:  As directed      Call MD for:  persistant nausea and vomiting    Complete by:  As directed      Call MD for:  severe uncontrolled pain    Complete by:  As directed      Call MD for:  temperature >100.4    Complete by:  As directed      Call MD for:    Complete by:  As directed   Stroke like symptoms.     Diet - low sodium heart healthy    Complete by:  As directed      Increase activity slowly    Complete by:  As directed             Medication List    STOP taking these medications        apixaban 5 MG Tabs tablet  Commonly known as:  ELIQUIS     losartan 100 MG tablet  Commonly known as:  COZAAR      TAKE these medications        atorvastatin 10 MG tablet  Commonly known as:  LIPITOR  Take 1 tablet (10 mg total) by mouth daily at 6 PM.     co-enzyme Q-10 30 MG capsule  Take 30 mg by mouth daily.     enoxaparin 120 MG/0.8ML injection  Commonly known as:  LOVENOX  Inject 0.71 mLs (106 mg total) into the skin every evening. Take daily at 6 pm starting 05/16/2016.     ferrous sulfate 325 (65 FE) MG tablet  Take 325 mg by mouth 2 (two) times daily with a meal.     finasteride 5 MG tablet  Commonly known as:  PROSCAR  Take  5 mg by mouth daily. Reported on 02/01/2016     multivitamin tablet  Take 1 tablet by mouth daily.     Omega 3 1000 MG Caps  Take 1,000 mg by mouth daily.     pantoprazole 40 MG tablet  Commonly known as:  PROTONIX  Take 40 mg by mouth daily.     Red Yeast Rice 600 MG Caps  Take 600 mg by mouth daily.     terazosin 10 MG capsule  Commonly known as:  HYTRIN  Take 10 mg by mouth at bedtime. Reported on 01/20/2016     vitamin B-12 1000 MCG tablet  Commonly known as:  CYANOCOBALAMIN  Take 1,000 mcg by mouth daily.     vitamin C 500 MG tablet  Commonly known as:  ASCORBIC ACID  Take 1,000 mg by mouth daily.       Follow-up Information    Follow up with [name], DO. Schedule an appointment as soon as possible  for a visit in 5 days.   Specialty:  Osteopathic Medicine   Why:  To be seen with repeat labs (CBC & BMP).   Contact information:   [address] [place] [address] [phone]       Follow up with Volanda Napoleon, MD. Schedule an appointment as soon as possible for a visit in 1 week.   Specialty:  Oncology   Contact information:   [address] [place] [address] [phone]       Follow up with Collene Gobble., MD. Schedule an appointment as soon as possible for a visit in 2 weeks.   Specialty:  Pulmonary Disease   Contact information:   [address] [place] [address] [phone]       Follow up with SETHI,PRAMOD, MD. Schedule an appointment as soon as possible for a visit in 2 months.   Specialties:  Neurology, Radiology   Contact information:   [address] [place] [address] [phone]      No Known Allergies  Consultations:  Pulmonary  Neurology-Sethi  Med Onc--Ennever  Pulmonology   Procedures/Studies: Dg Chest 2 View  05/14/2016  CLINICAL DATA:  Follow-up stroke. History of metastatic lung cancer. EXAM: CHEST  2 VIEW COMPARISON:  Chest CT May 07, 2016 DINGS: Cardiac silhouette is normal and unchanged. Calcified aortic knob. Spiculated RIGHT hilar mass better seen on recent CT with scattered pulmonary nodules/metastasis. Scarring LEFT lung base. Increased lung volumes. No pleural effusion. No pneumothorax. Soft tissue planes and included osseous structures are nonsuspicious. Moderate chronic approximate T12 compression fracture. IMPRESSION: RIGHT hilar mass corresponding to known cancer with multiple pulmonary nodules/metastasis. COPD, no superimposed acute cardiopulmonary process. Electronically Signed   By: Elon Alas M.D.   On: 05/14/2016 22:08   Dg Chest 2 View  05/03/2016  CLINICAL DATA:  Shortness of breath since stent placement in February of 2017. EXAM: CHEST  2 VIEW COMPARISON:   None. FINDINGS: Heart size is upper normal. Lungs are hyperexpanded. There is a masslike density within the suprahilar right lung, measuring approximately 3 cm greatest dimension. Smaller nodular densities are seen along the periphery of the left lung there is probable scarring and/or small effusion at the left lung base. There is a compression fracture deformity at the thoracolumbar junction which is of uncertain age. IMPRESSION: 1. Masslike density within the right upper lung, suprahilar region, measuring approximately 3 cm greatest dimension. There are no prior studies available for comparison.  This is concerning for neoplastic mass. Recommend chest CT for further characterization. 2. Additional smaller nodular densities along the periphery of the left lung. These are also suspicious for neoplastic nodules. 3. Hyperexpanded lungs indicating COPD/emphysema. 4. Compression fracture deformity at the thoracolumbar junction, of uncertain age but favored to be chronic. This also could be further characterized with CT. These results will be called to the ordering clinician or representative by the Radiologist Assistant, and communication documented in the PACS or zVision Dashboard. Electronically Signed   By: Franki Cabot M.D.   On: 05/03/2016 17:05   Ct Head Wo Contrast  05/13/2016  CLINICAL DATA:  81 year old male with vision loss and altered mental status EXAM: CT HEAD WITHOUT CONTRAST TECHNIQUE: Contiguous axial images were obtained from the base of the skull through the vertex without intravenous contrast. COMPARISON:  None. FINDINGS: There is no acute intracranial hemorrhage. There is mild to moderate age-related atrophy and chronic microvascular ischemic changes. There is a focal area of lower attenuation with loss of gray-white matter discrimination and effacement of the sulci in the right occipital lobe medially compatible with a right PCA territory infarct, likely acute or subacute. Clinical correlation  is recommended. There is associated minimal mass effect on the adjacent brain parenchyma. No midline shift. There is mild mucoperiosteal thickening of paranasal sinuses with partial opacification of the ethmoid air cells. No air-fluid level. The mastoid air cells are clear. The calvarium is intact. IMPRESSION: No acute intracranial hemorrhage. Findings compatible with right PCA territory infarct, likely acute or subacute. Correlation with clinical exam recommended. MRI may provide better evaluation. These results were called by telephone at the time of interpretation on 05/13/2016 at 11:28 pm to Dr. Wyvonnia Dusky, who verbally acknowledged these results. Electronically Signed   By: Anner Crete M.D.   On: 05/13/2016 23:33   Ct Chest W Contrast  05/11/2016  ADDENDUM REPORT: 05/11/2016 15:34 ADDENDUM: Post processing for super D guidance was requested on 05/11/2016. The corresponding images were added to this examination. Electronically Signed   By: Richardean Sale M.D.   On: 05/11/2016 15:34  05/11/2016  CLINICAL DATA:  Shortness of breath with difficulty breathing and weight loss. Abnormal chest x-ray with findings worrisome for neoplasm. No given history of malignancy. EXAM: CT CHEST, ABDOMEN, AND PELVIS WITH CONTRAST TECHNIQUE: Multidetector CT imaging of the chest, abdomen and pelvis was performed following the standard protocol during bolus administration of intravenous contrast. CONTRAST:  67m ISOVUE-300 IOPAMIDOL (ISOVUE-300) INJECTION 61% COMPARISON:  Chest radiograph 05/03/2016. FINDINGS: CT CHEST Cardiovascular: The heart size is normal. There is no pericardial effusion. There is atherosclerosis of the aorta, great vessels and coronary arteries. Although not performed as a CTA, there is evidence of acute/subacute pulmonary embolism within the left lower lobe pulmonary artery. Mediastinum/Nodes: There are multiple enlarged mediastinal and right hilar lymph nodes. There is a dominant precarinal nodal mass  measuring 3.9 x 2.4 cm on image 28. There is a right hilar node measuring up to 1.7 cm on image 32. The thyroid gland, trachea and esophagus demonstrate no significant findings. Lungs/Pleura: There is no pleural effusion. This dominant spiculated right upper lobe lesion measuring 3.2 x 2.6 cm on image 59, likely a primary lung cancer. There are numerous other pulmonary nodules bilaterally suspicious for metastatic disease. The largest of these other nodules measures 14 x 13 mm in the left lower lobe on image 121. There are multiple other smaller lesions, more than 10 in each lung. Musculoskeletal/Chest wall: No chest wall mass or  suspicious osseous findings. There is a mild superior endplate compression deformity at T8 which appears benign. CT ABDOMEN AND PELVIS FINDINGS Hepatobiliary: The liver demonstrates mild contour irregularity, but no focal abnormality. No evidence of gallstones, gallbladder wall thickening or biliary dilatation. Pancreas: Unremarkable. No pancreatic ductal dilatation or surrounding inflammatory changes. Spleen: Normal in size without focal abnormality. Adrenals/Urinary Tract: 15 mm left adrenal nodule measures 5 HU on the delayed images, likely an incidental adenoma. The right adrenal gland appears normal. 5.1 cm lesion projecting from the lower pole of the left kidney measures slightly higher than water density (21 -23 HU), although is probably a cyst. There are other smaller low-density renal lesions which are probably cysts as well. No definite enhancing renal mass or hydronephrosis. There is no evidence of ureteral calculus. The bladder is heavily trabeculated. There is a calculus within a posterior bladder diverticulum. Stomach/Bowel: No evidence of bowel wall thickening, distention or surrounding inflammatory change. The stomach is decompressed. Vascular/Lymphatic: There are no enlarged abdominal or pelvic lymph nodes. There is extensive aortic and branch vessel atherosclerosis. There  is nearly occlusive thrombus within the left femoral vein. This extends into the left external iliac vein. There is also nonocclusive thrombus in the right femoral vein. The IVC appears patent. Reproductive: There are extensive calcifications throughout the prostate gland. Other: No ascites or peritoneal nodularity. Musculoskeletal: No acute or significant osseous findings. There is a superior endplate compression deformity at L1 with an associated Schmorl's node which appears benign. Degenerative changes are present in the lower lumbar spine. IMPRESSION: 1. Findings are consistent with right upper lobe lung cancer with multiple enlarged mediastinal and right hilar lymph nodes as well as numerous bilateral pulmonary nodules consistent with metastases. 2. No definite evidence of metastatic disease within the abdomen or pelvis. 3. Bilateral DVT with thromboembolic disease (pulmonary embolism) within the left pulmonary artery. 4. Other incidental findings include the presence of extensive atherosclerosis, trabeculated bladder containing a calculus, probable incidental left adrenal adenoma, bilateral renal cysts, and chronic appearing thoracolumbar compression deformities. 5. Critical Value/emergent results were called by telephone at the time of interpretation on 05/07/2016 at 4:38 pm to [facility] [place] for Dr. [name] , who verbally acknowledged these results. Electronically Signed: By: Richardean Sale M.D. On: 05/07/2016 16:39   Mr [name] QJ Contrast  05/14/2016  CLINICAL DATA:  Confusion, LEFT vision changes. Follow-up acute stroke. History of locally metastatic bronchogenic carcinoma, hypercoagulable state, hypertension. EXAM: MRI HEAD WITHOUT AND WITH CONTRAST MRA HEAD WITHOUT CONTRAST TECHNIQUE: Multiplanar, multiecho pulse sequences of the brain and surrounding structures were obtained without and with intravenous contrast. Angiographic images of the head were obtained using MRA technique without  contrast. CONTRAST:  76m MULTIHANCE GADOBENATE DIMEGLUMINE 529 MG/ML IV SOLN COMPARISON:  CT HEAD May 13, 2016 FINDINGS: MRI HEAD FINDINGS INTRACRANIAL CONTENTS: Confluent medial RIGHT temporal occipital reduced diffusion with low ADC values and intermediate to bright T2 FLAIR signal. Scattered subcentimeter foci of reduced diffusion LEFT occipital lobe, bilateral cerebellum in addition to 16 x 9 mm focus of reduced diffusion RIGHT cerebellum with low ADC value. At least 2 subcentimeter focus of reduced diffusion bifrontal lobes no susceptibility artifact is suggests intraparenchymal hemorrhage. Linear reduced diffusion RIGHT frontal extra-axial space with bright FLAIR signal and intrinsic T1 shortening. Ventricles and sulci are normal for patient's age. Minimal white matter changes most compatible with chronic small vessel ischemic disease are less than expected for age. No intraparenchymal masses, mass effect or abnormal enhancement. ORBITS: The included ocular  globes and orbital contents are non-suspicious. SINUSES: The mastoid air-cells and included paranasal sinuses are well-aerated. SKULL/SOFT TISSUES: No abnormal sellar expansion. No suspicious calvarial bone marrow signal. Generalized bright T1 bone marrow signal most compatible with osteopenia. Craniocervical junction maintained. Sub cm probable lymph node RIGHT parotid gland. Patient is edentulous. MRA HEAD FINDINGS ANTERIOR CIRCULATION: Normal flow related enhancement of the included cervical, petrous, cavernous and supraclinoid internal carotid arteries. Patent anterior communicating artery. Normal flow related enhancement of the anterior and middle cerebral arteries, including distal segments. No large vessel occlusion, high-grade stenosis, abnormal luminal irregularity, aneurysm. POSTERIOR CIRCULATION: RIGHT vertebral artery is dominant. Basilar artery is patent, with normal flow related enhancement of the main branch vessels. Robust LEFT and small  RIGHT posterior communicating arteries present. Occluded RIGHT proximal P2 segment. No high-grade stenosis, abnormal luminal irregularity, aneurysm. IMPRESSION: MRI HEAD: Acute large RIGHT posterior cerebral artery territory infarct (RIGHT temporal occipital lobe). Additional small acute infarcts bilateral cerebella, LEFT occipital lobe and bifrontal lobes. Findings most consistent with embolic phenomena. Trace subacute RIGHT frontal subdural hematoma. No MR findings of intracranial metastasis. MRA HEAD: RIGHT posterior cerebral artery occlusion (proximal P2 segment). Otherwise negative MRA head. These results will be called to the ordering clinician or representative by the Radiologist Assistant, and communication documented in the PACS or zVision Dashboard. Electronically Signed   By: Elon Alas M.D.   On: 05/14/2016 22:27   Ct Abdomen Pelvis W Contrast  05/11/2016  ADDENDUM REPORT: 05/11/2016 15:34 ADDENDUM: Post processing for super D guidance was requested on 05/11/2016. The corresponding images were added to this examination. Electronically Signed   By: Richardean Sale M.D.   On: 05/11/2016 15:34  05/11/2016  CLINICAL DATA:  Shortness of breath with difficulty breathing and weight loss. Abnormal chest x-ray with findings worrisome for neoplasm. No given history of malignancy. EXAM: CT CHEST, ABDOMEN, AND PELVIS WITH CONTRAST TECHNIQUE: Multidetector CT imaging of the chest, abdomen and pelvis was performed following the standard protocol during bolus administration of intravenous contrast. CONTRAST:  6m ISOVUE-300 IOPAMIDOL (ISOVUE-300) INJECTION 61% COMPARISON:  Chest radiograph 05/03/2016. FINDINGS: CT CHEST Cardiovascular: The heart size is normal. There is no pericardial effusion. There is atherosclerosis of the aorta, great vessels and coronary arteries. Although not performed as a CTA, there is evidence of acute/subacute pulmonary embolism within the left lower lobe pulmonary artery.  Mediastinum/Nodes: There are multiple enlarged mediastinal and right hilar lymph nodes. There is a dominant precarinal nodal mass measuring 3.9 x 2.4 cm on image 28. There is a right hilar node measuring up to 1.7 cm on image 32. The thyroid gland, trachea and esophagus demonstrate no significant findings. Lungs/Pleura: There is no pleural effusion. This dominant spiculated right upper lobe lesion measuring 3.2 x 2.6 cm on image 59, likely a primary lung cancer. There are numerous other pulmonary nodules bilaterally suspicious for metastatic disease. The largest of these other nodules measures 14 x 13 mm in the left lower lobe on image 121. There are multiple other smaller lesions, more than 10 in each lung. Musculoskeletal/Chest wall: No chest wall mass or suspicious osseous findings. There is a mild superior endplate compression deformity at T8 which appears benign. CT ABDOMEN AND PELVIS FINDINGS Hepatobiliary: The liver demonstrates mild contour irregularity, but no focal abnormality. No evidence of gallstones, gallbladder wall thickening or biliary dilatation. Pancreas: Unremarkable. No pancreatic ductal dilatation or surrounding inflammatory changes. Spleen: Normal in size without focal abnormality. Adrenals/Urinary Tract: 15 mm left adrenal nodule measures 5 HU on the  delayed images, likely an incidental adenoma. The right adrenal gland appears normal. 5.1 cm lesion projecting from the lower pole of the left kidney measures slightly higher than water density (21 -23 HU), although is probably a cyst. There are other smaller low-density renal lesions which are probably cysts as well. No definite enhancing renal mass or hydronephrosis. There is no evidence of ureteral calculus. The bladder is heavily trabeculated. There is a calculus within a posterior bladder diverticulum. Stomach/Bowel: No evidence of bowel wall thickening, distention or surrounding inflammatory change. The stomach is decompressed.  Vascular/Lymphatic: There are no enlarged abdominal or pelvic lymph nodes. There is extensive aortic and branch vessel atherosclerosis. There is nearly occlusive thrombus within the left femoral vein. This extends into the left external iliac vein. There is also nonocclusive thrombus in the right femoral vein. The IVC appears patent. Reproductive: There are extensive calcifications throughout the prostate gland. Other: No ascites or peritoneal nodularity. Musculoskeletal: No acute or significant osseous findings. There is a superior endplate compression deformity at L1 with an associated Schmorl's node which appears benign. Degenerative changes are present in the lower lumbar spine. IMPRESSION: 1. Findings are consistent with right upper lobe lung cancer with multiple enlarged mediastinal and right hilar lymph nodes as well as numerous bilateral pulmonary nodules consistent with metastases. 2. No definite evidence of metastatic disease within the abdomen or pelvis. 3. Bilateral DVT with thromboembolic disease (pulmonary embolism) within the left pulmonary artery. 4. Other incidental findings include the presence of extensive atherosclerosis, trabeculated bladder containing a calculus, probable incidental left adrenal adenoma, bilateral renal cysts, and chronic appearing thoracolumbar compression deformities. 5. Critical Value/emergent results were called by telephone at the time of interpretation on 05/07/2016 at 4:38 pm to [facility], [place] for Dr. [name] , who verbally acknowledged these results. Electronically Signed: By: Richardean Sale M.D. On: 05/07/2016 16:39   US Venous Img Lower Bilateral  05/08/2016  CLINICAL DATA:  History of lung carcinoma. Thrombus seen in the femoral veins bilateral recent CT scan. EXAM: BILATERAL LOWER EXTREMITY VENOUS DOPPLER ULTRASOUND TECHNIQUE: Gray-scale sonography with graded compression, as well as color Doppler and duplex ultrasound were performed to evaluate the lower  extremity deep venous systems from the level of the common femoral vein and including the common femoral, femoral, profunda femoral, popliteal and calf veins including the posterior tibial, peroneal and gastrocnemius veins when visible. The superficial great saphenous vein was also interrogated. Spectral Doppler was utilized to evaluate flow at rest and with distal augmentation maneuvers in the common femoral, femoral and popliteal veins. COMPARISON:  CT chest, abdomen and pelvis 05/07/2016. FINDINGS: RIGHT LOWER EXTREMITY Common Femoral Vein: Partially occlusive thrombus identified. Saphenofemoral Junction: Partially occlusive thrombus identified. Profunda Femoral Vein: Partially occlusive thrombus identified. Femoral Vein: Occlusive thrombus identified. Popliteal Vein: Occlusive thrombus identified. Calf Veins: Partially occlusive thrombus is seen in the posterior tibial vein. Peroneal vein is not visualized. Thrombus is also identified in the gastrocnemius vein. Lesser saphenous Vein: Thrombus identified. Other Findings:  None. LEFT LOWER EXTREMITY Common Femoral Vein: Occlusive thrombus identified. Saphenofemoral Junction: Occlusive thrombus identified. Profunda Femoral Vein: Occlusive thrombus identified. Femoral Vein: Occlusive thrombus identified. Popliteal Vein: Occlusive thrombus identified. Calf Veins: Not visualized. Superficial Great Saphenous Vein: Not reported. Other Findings:  None. IMPRESSION: Extensive bilateral deep venous thrombosis. In the left leg, occlusive clot is seen from the common femoral through the popliteal vein. On the right, occlusive thrombus is seen from the femoral through the popliteal vein. Critical Value/emergent results were called by  telephone at the time of interpretation on 05/08/2016 at 3:47 pm to Dr. [name] , who verbally acknowledged these results. Electronically Signed   By: Inge Rise M.D.   On: 05/08/2016 15:49   Mr Jodene Nam Head/brain Wo Cm  05/14/2016   CLINICAL DATA:  Confusion, LEFT vision changes. Follow-up acute stroke. History of locally metastatic bronchogenic carcinoma, hypercoagulable state, hypertension. EXAM: MRI HEAD WITHOUT AND WITH CONTRAST MRA HEAD WITHOUT CONTRAST TECHNIQUE: Multiplanar, multiecho pulse sequences of the brain and surrounding structures were obtained without and with intravenous contrast. Angiographic images of the head were obtained using MRA technique without contrast. CONTRAST:  25m MULTIHANCE GADOBENATE DIMEGLUMINE 529 MG/ML IV SOLN COMPARISON:  CT HEAD May 13, 2016 FINDINGS: MRI HEAD FINDINGS INTRACRANIAL CONTENTS: Confluent medial RIGHT temporal occipital reduced diffusion with low ADC values and intermediate to bright T2 FLAIR signal. Scattered subcentimeter foci of reduced diffusion LEFT occipital lobe, bilateral cerebellum in addition to 16 x 9 mm focus of reduced diffusion RIGHT cerebellum with low ADC value. At least 2 subcentimeter focus of reduced diffusion bifrontal lobes no susceptibility artifact is suggests intraparenchymal hemorrhage. Linear reduced diffusion RIGHT frontal extra-axial space with bright FLAIR signal and intrinsic T1 shortening. Ventricles and sulci are normal for patient's age. Minimal white matter changes most compatible with chronic small vessel ischemic disease are less than expected for age. No intraparenchymal masses, mass effect or abnormal enhancement. ORBITS: The included ocular globes and orbital contents are non-suspicious. SINUSES: The mastoid air-cells and included paranasal sinuses are well-aerated. SKULL/SOFT TISSUES: No abnormal sellar expansion. No suspicious calvarial bone marrow signal. Generalized bright T1 bone marrow signal most compatible with osteopenia. Craniocervical junction maintained. Sub cm probable lymph node RIGHT parotid gland. Patient is edentulous. MRA HEAD FINDINGS ANTERIOR CIRCULATION: Normal flow related enhancement of the included cervical, petrous, cavernous  and supraclinoid internal carotid arteries. Patent anterior communicating artery. Normal flow related enhancement of the anterior and middle cerebral arteries, including distal segments. No large vessel occlusion, high-grade stenosis, abnormal luminal irregularity, aneurysm. POSTERIOR CIRCULATION: RIGHT vertebral artery is dominant. Basilar artery is patent, with normal flow related enhancement of the main branch vessels. Robust LEFT and small RIGHT posterior communicating arteries present. Occluded RIGHT proximal P2 segment. No high-grade stenosis, abnormal luminal irregularity, aneurysm. IMPRESSION: MRI HEAD: Acute large RIGHT posterior cerebral artery territory infarct (RIGHT temporal occipital lobe). Additional small acute infarcts bilateral cerebella, LEFT occipital lobe and bifrontal lobes. Findings most consistent with embolic phenomena. Trace subacute RIGHT frontal subdural hematoma. No MR findings of intracranial metastasis. MRA HEAD: RIGHT posterior cerebral artery occlusion (proximal P2 segment). Otherwise negative MRA head. These results will be called to the ordering clinician or representative by the Radiologist Assistant, and communication documented in the PACS or zVision Dashboard. Electronically Signed   By: CElon AlasM.D.   On: 05/14/2016 22:27     Subjective Patient denies complaints and is anxious to go home. He had been informed by hospitalist yesterday that he would be going home early this morning.    Discharge Exam:  Filed Vitals:   05/15/16 2144 05/16/16 0213 05/16/16 0607 05/16/16 0934  BP: 124/46 144/58 147/58 139/51  Pulse: 74 72 62 75  Temp: 99.1 F (37.3 C) 99.2 F (37.3 C) 99.1 F (37.3 C) 97.8 F (36.6 C)  TempSrc: Oral Oral Oral Oral  Resp: '18 18 18 20  '$ SpO2: 100% 94% 94% 96%    General: Pt is alert, awake, not in acute distress. Lying comfortably supine in bed. Cardiovascular: RRR,  S1/S2 +, no rubs, no gallops. Telemetry: Sinus rhythm with  occasional PVCs. Respiratory: CTA bilaterally, no wheezing, no rhonchi. No increased work of breathing. Abdominal: Soft, NT, ND, bowel sounds + Extremities: no edema, no cyanosis CNS: Alert and oriented. No overt focal deficits.   The results of significant diagnostics from this hospitalization (including imaging, microbiology, ancillary and laboratory) are listed below for reference.    Significant Diagnostic Studies: Dg Chest 2 View  05/14/2016  CLINICAL DATA:  Follow-up stroke. History of metastatic lung cancer. EXAM: CHEST  2 VIEW COMPARISON:  Chest CT May 07, 2016 DINGS: Cardiac silhouette is normal and unchanged. Calcified aortic knob. Spiculated RIGHT hilar mass better seen on recent CT with scattered pulmonary nodules/metastasis. Scarring LEFT lung base. Increased lung volumes. No pleural effusion. No pneumothorax. Soft tissue planes and included osseous structures are nonsuspicious. Moderate chronic approximate T12 compression fracture. IMPRESSION: RIGHT hilar mass corresponding to known cancer with multiple pulmonary nodules/metastasis. COPD, no superimposed acute cardiopulmonary process. Electronically Signed   By: Elon Alas M.D.   On: 05/14/2016 22:08   Dg Chest 2 View  05/03/2016  CLINICAL DATA:  Shortness of breath since stent placement in February of 2017. EXAM: CHEST  2 VIEW COMPARISON:  None. FINDINGS: Heart size is upper normal. Lungs are hyperexpanded. There is a masslike density within the suprahilar right lung, measuring approximately 3 cm greatest dimension. Smaller nodular densities are seen along the periphery of the left lung there is probable scarring and/or small effusion at the left lung base. There is a compression fracture deformity at the thoracolumbar junction which is of uncertain age. IMPRESSION: 1. Masslike density within the right upper lung, suprahilar region, measuring approximately 3 cm greatest dimension. There are no prior studies available for  comparison. This is concerning for neoplastic mass. Recommend chest CT for further characterization. 2. Additional smaller nodular densities along the periphery of the left lung. These are also suspicious for neoplastic nodules. 3. Hyperexpanded lungs indicating COPD/emphysema. 4. Compression fracture deformity at the thoracolumbar junction, of uncertain age but favored to be chronic. This also could be further characterized with CT. These results will be called to the ordering clinician or representative by the Radiologist Assistant, and communication documented in the PACS or zVision Dashboard. Electronically Signed   By: Franki Cabot M.D.   On: 05/03/2016 17:05   Ct Head Wo Contrast  05/13/2016  CLINICAL DATA:  81 year old male with vision loss and altered mental status EXAM: CT HEAD WITHOUT CONTRAST TECHNIQUE: Contiguous axial images were obtained from the base of the skull through the vertex without intravenous contrast. COMPARISON:  None. FINDINGS: There is no acute intracranial hemorrhage. There is mild to moderate age-related atrophy and chronic microvascular ischemic changes. There is a focal area of lower attenuation with loss of gray-white matter discrimination and effacement of the sulci in the right occipital lobe medially compatible with a right PCA territory infarct, likely acute or subacute. Clinical correlation is recommended. There is associated minimal mass effect on the adjacent brain parenchyma. No midline shift. There is mild mucoperiosteal thickening of paranasal sinuses with partial opacification of the ethmoid air cells. No air-fluid level. The mastoid air cells are clear. The calvarium is intact. IMPRESSION: No acute intracranial hemorrhage. Findings compatible with right PCA territory infarct, likely acute or subacute. Correlation with clinical exam recommended. MRI may provide better evaluation. These results were called by telephone at the time of interpretation on 05/13/2016 at 11:28  pm to Dr. Wyvonnia Dusky, who verbally acknowledged these  results. Electronically Signed   By: Anner Crete M.D.   On: 05/13/2016 23:33   Ct Chest W Contrast  05/11/2016  ADDENDUM REPORT: 05/11/2016 15:34 ADDENDUM: Post processing for super D guidance was requested on 05/11/2016. The corresponding images were added to this examination. Electronically Signed   By: Richardean Sale M.D.   On: 05/11/2016 15:34  05/11/2016  CLINICAL DATA:  Shortness of breath with difficulty breathing and weight loss. Abnormal chest x-ray with findings worrisome for neoplasm. No given history of malignancy. EXAM: CT CHEST, ABDOMEN, AND PELVIS WITH CONTRAST TECHNIQUE: Multidetector CT imaging of the chest, abdomen and pelvis was performed following the standard protocol during bolus administration of intravenous contrast. CONTRAST:  2m ISOVUE-300 IOPAMIDOL (ISOVUE-300) INJECTION 61% COMPARISON:  Chest radiograph 05/03/2016. FINDINGS: CT CHEST Cardiovascular: The heart size is normal. There is no pericardial effusion. There is atherosclerosis of the aorta, great vessels and coronary arteries. Although not performed as a CTA, there is evidence of acute/subacute pulmonary embolism within the left lower lobe pulmonary artery. Mediastinum/Nodes: There are multiple enlarged mediastinal and right hilar lymph nodes. There is a dominant precarinal nodal mass measuring 3.9 x 2.4 cm on image 28. There is a right hilar node measuring up to 1.7 cm on image 32. The thyroid gland, trachea and esophagus demonstrate no significant findings. Lungs/Pleura: There is no pleural effusion. This dominant spiculated right upper lobe lesion measuring 3.2 x 2.6 cm on image 59, likely a primary lung cancer. There are numerous other pulmonary nodules bilaterally suspicious for metastatic disease. The largest of these other nodules measures 14 x 13 mm in the left lower lobe on image 121. There are multiple other smaller lesions, more than 10 in each lung.  Musculoskeletal/Chest wall: No chest wall mass or suspicious osseous findings. There is a mild superior endplate compression deformity at T8 which appears benign. CT ABDOMEN AND PELVIS FINDINGS Hepatobiliary: The liver demonstrates mild contour irregularity, but no focal abnormality. No evidence of gallstones, gallbladder wall thickening or biliary dilatation. Pancreas: Unremarkable. No pancreatic ductal dilatation or surrounding inflammatory changes. Spleen: Normal in size without focal abnormality. Adrenals/Urinary Tract: 15 mm left adrenal nodule measures 5 HU on the delayed images, likely an incidental adenoma. The right adrenal gland appears normal. 5.1 cm lesion projecting from the lower pole of the left kidney measures slightly higher than water density (21 -23 HU), although is probably a cyst. There are other smaller low-density renal lesions which are probably cysts as well. No definite enhancing renal mass or hydronephrosis. There is no evidence of ureteral calculus. The bladder is heavily trabeculated. There is a calculus within a posterior bladder diverticulum. Stomach/Bowel: No evidence of bowel wall thickening, distention or surrounding inflammatory change. The stomach is decompressed. Vascular/Lymphatic: There are no enlarged abdominal or pelvic lymph nodes. There is extensive aortic and branch vessel atherosclerosis. There is nearly occlusive thrombus within the left femoral vein. This extends into the left external iliac vein. There is also nonocclusive thrombus in the right femoral vein. The IVC appears patent. Reproductive: There are extensive calcifications throughout the prostate gland. Other: No ascites or peritoneal nodularity. Musculoskeletal: No acute or significant osseous findings. There is a superior endplate compression deformity at L1 with an associated Schmorl's node which appears benign. Degenerative changes are present in the lower lumbar spine. IMPRESSION: 1. Findings are consistent  with right upper lobe lung cancer with multiple enlarged mediastinal and right hilar lymph nodes as well as numerous bilateral pulmonary nodules consistent with metastases. 2. No  definite evidence of metastatic disease within the abdomen or pelvis. 3. Bilateral DVT with thromboembolic disease (pulmonary embolism) within the left pulmonary artery. 4. Other incidental findings include the presence of extensive atherosclerosis, trabeculated bladder containing a calculus, probable incidental left adrenal adenoma, bilateral renal cysts, and chronic appearing thoracolumbar compression deformities. 5. Critical Value/emergent results were called by telephone at the time of interpretation on 05/07/2016 at 4:38 pm to [facility], [place] for Dr. [name] , who verbally acknowledged these results. Electronically Signed: By: Richardean Sale M.D. On: 05/07/2016 16:39   Mr [name] MG Contrast  05/14/2016  CLINICAL DATA:  Confusion, LEFT vision changes. Follow-up acute stroke. History of locally metastatic bronchogenic carcinoma, hypercoagulable state, hypertension. EXAM: MRI HEAD WITHOUT AND WITH CONTRAST MRA HEAD WITHOUT CONTRAST TECHNIQUE: Multiplanar, multiecho pulse sequences of the brain and surrounding structures were obtained without and with intravenous contrast. Angiographic images of the head were obtained using MRA technique without contrast. CONTRAST:  43m MULTIHANCE GADOBENATE DIMEGLUMINE 529 MG/ML IV SOLN COMPARISON:  CT HEAD May 13, 2016 FINDINGS: MRI HEAD FINDINGS INTRACRANIAL CONTENTS: Confluent medial RIGHT temporal occipital reduced diffusion with low ADC values and intermediate to bright T2 FLAIR signal. Scattered subcentimeter foci of reduced diffusion LEFT occipital lobe, bilateral cerebellum in addition to 16 x 9 mm focus of reduced diffusion RIGHT cerebellum with low ADC value. At least 2 subcentimeter focus of reduced diffusion bifrontal lobes no susceptibility artifact is suggests intraparenchymal  hemorrhage. Linear reduced diffusion RIGHT frontal extra-axial space with bright FLAIR signal and intrinsic T1 shortening. Ventricles and sulci are normal for patient's age. Minimal white matter changes most compatible with chronic small vessel ischemic disease are less than expected for age. No intraparenchymal masses, mass effect or abnormal enhancement. ORBITS: The included ocular globes and orbital contents are non-suspicious. SINUSES: The mastoid air-cells and included paranasal sinuses are well-aerated. SKULL/SOFT TISSUES: No abnormal sellar expansion. No suspicious calvarial bone marrow signal. Generalized bright T1 bone marrow signal most compatible with osteopenia. Craniocervical junction maintained. Sub cm probable lymph node RIGHT parotid gland. Patient is edentulous. MRA HEAD FINDINGS ANTERIOR CIRCULATION: Normal flow related enhancement of the included cervical, petrous, cavernous and supraclinoid internal carotid arteries. Patent anterior communicating artery. Normal flow related enhancement of the anterior and middle cerebral arteries, including distal segments. No large vessel occlusion, high-grade stenosis, abnormal luminal irregularity, aneurysm. POSTERIOR CIRCULATION: RIGHT vertebral artery is dominant. Basilar artery is patent, with normal flow related enhancement of the main branch vessels. Robust LEFT and small RIGHT posterior communicating arteries present. Occluded RIGHT proximal P2 segment. No high-grade stenosis, abnormal luminal irregularity, aneurysm. IMPRESSION: MRI HEAD: Acute large RIGHT posterior cerebral artery territory infarct (RIGHT temporal occipital lobe). Additional small acute infarcts bilateral cerebella, LEFT occipital lobe and bifrontal lobes. Findings most consistent with embolic phenomena. Trace subacute RIGHT frontal subdural hematoma. No MR findings of intracranial metastasis. MRA HEAD: RIGHT posterior cerebral artery occlusion (proximal P2 segment). Otherwise negative  MRA head. These results will be called to the ordering clinician or representative by the Radiologist Assistant, and communication documented in the PACS or zVision Dashboard. Electronically Signed   By: CElon AlasM.D.   On: 05/14/2016 22:27   Ct Abdomen Pelvis W Contrast  05/11/2016  ADDENDUM REPORT: 05/11/2016 15:34 ADDENDUM: Post processing for super D guidance was requested on 05/11/2016. The corresponding images were added to this examination. Electronically Signed   By: WRichardean SaleM.D.   On: 05/11/2016 15:34  05/11/2016  CLINICAL DATA:  Shortness of breath with  difficulty breathing and weight loss. Abnormal chest x-ray with findings worrisome for neoplasm. No given history of malignancy. EXAM: CT CHEST, ABDOMEN, AND PELVIS WITH CONTRAST TECHNIQUE: Multidetector CT imaging of the chest, abdomen and pelvis was performed following the standard protocol during bolus administration of intravenous contrast. CONTRAST:  31m ISOVUE-300 IOPAMIDOL (ISOVUE-300) INJECTION 61% COMPARISON:  Chest radiograph 05/03/2016. FINDINGS: CT CHEST Cardiovascular: The heart size is normal. There is no pericardial effusion. There is atherosclerosis of the aorta, great vessels and coronary arteries. Although not performed as a CTA, there is evidence of acute/subacute pulmonary embolism within the left lower lobe pulmonary artery. Mediastinum/Nodes: There are multiple enlarged mediastinal and right hilar lymph nodes. There is a dominant precarinal nodal mass measuring 3.9 x 2.4 cm on image 28. There is a right hilar node measuring up to 1.7 cm on image 32. The thyroid gland, trachea and esophagus demonstrate no significant findings. Lungs/Pleura: There is no pleural effusion. This dominant spiculated right upper lobe lesion measuring 3.2 x 2.6 cm on image 59, likely a primary lung cancer. There are numerous other pulmonary nodules bilaterally suspicious for metastatic disease. The largest of these other nodules measures  14 x 13 mm in the left lower lobe on image 121. There are multiple other smaller lesions, more than 10 in each lung. Musculoskeletal/Chest wall: No chest wall mass or suspicious osseous findings. There is a mild superior endplate compression deformity at T8 which appears benign. CT ABDOMEN AND PELVIS FINDINGS Hepatobiliary: The liver demonstrates mild contour irregularity, but no focal abnormality. No evidence of gallstones, gallbladder wall thickening or biliary dilatation. Pancreas: Unremarkable. No pancreatic ductal dilatation or surrounding inflammatory changes. Spleen: Normal in size without focal abnormality. Adrenals/Urinary Tract: 15 mm left adrenal nodule measures 5 HU on the delayed images, likely an incidental adenoma. The right adrenal gland appears normal. 5.1 cm lesion projecting from the lower pole of the left kidney measures slightly higher than water density (21 -23 HU), although is probably a cyst. There are other smaller low-density renal lesions which are probably cysts as well. No definite enhancing renal mass or hydronephrosis. There is no evidence of ureteral calculus. The bladder is heavily trabeculated. There is a calculus within a posterior bladder diverticulum. Stomach/Bowel: No evidence of bowel wall thickening, distention or surrounding inflammatory change. The stomach is decompressed. Vascular/Lymphatic: There are no enlarged abdominal or pelvic lymph nodes. There is extensive aortic and branch vessel atherosclerosis. There is nearly occlusive thrombus within the left femoral vein. This extends into the left external iliac vein. There is also nonocclusive thrombus in the right femoral vein. The IVC appears patent. Reproductive: There are extensive calcifications throughout the prostate gland. Other: No ascites or peritoneal nodularity. Musculoskeletal: No acute or significant osseous findings. There is a superior endplate compression deformity at L1 with an associated Schmorl's node  which appears benign. Degenerative changes are present in the lower lumbar spine. IMPRESSION: 1. Findings are consistent with right upper lobe lung cancer with multiple enlarged mediastinal and right hilar lymph nodes as well as numerous bilateral pulmonary nodules consistent with metastases. 2. No definite evidence of metastatic disease within the abdomen or pelvis. 3. Bilateral DVT with thromboembolic disease (pulmonary embolism) within the left pulmonary artery. 4. Other incidental findings include the presence of extensive atherosclerosis, trabeculated bladder containing a calculus, probable incidental left adrenal adenoma, bilateral renal cysts, and chronic appearing thoracolumbar compression deformities. 5. Critical Value/emergent results were called by telephone at the time of interpretation on 05/07/2016 at 4:38 pm to  Suanne Marker, CMA for Dr. [name] , who verbally acknowledged these results. Electronically Signed: By: Richardean Sale M.D. On: 05/07/2016 16:39   US Venous Img Lower Bilateral  05/08/2016  CLINICAL DATA:  History of lung carcinoma. Thrombus seen in the femoral veins bilateral recent CT scan. EXAM: BILATERAL LOWER EXTREMITY VENOUS DOPPLER ULTRASOUND TECHNIQUE: Gray-scale sonography with graded compression, as well as color Doppler and duplex ultrasound were performed to evaluate the lower extremity deep venous systems from the level of the common femoral vein and including the common femoral, femoral, profunda femoral, popliteal and calf veins including the posterior tibial, peroneal and gastrocnemius veins when visible. The superficial great saphenous vein was also interrogated. Spectral Doppler was utilized to evaluate flow at rest and with distal augmentation maneuvers in the common femoral, femoral and popliteal veins. COMPARISON:  CT chest, abdomen and pelvis 05/07/2016. FINDINGS: RIGHT LOWER EXTREMITY Common Femoral Vein: Partially occlusive thrombus identified. Saphenofemoral  Junction: Partially occlusive thrombus identified. Profunda Femoral Vein: Partially occlusive thrombus identified. Femoral Vein: Occlusive thrombus identified. Popliteal Vein: Occlusive thrombus identified. Calf Veins: Partially occlusive thrombus is seen in the posterior tibial vein. Peroneal vein is not visualized. Thrombus is also identified in the gastrocnemius vein. Lesser saphenous Vein: Thrombus identified. Other Findings:  None. LEFT LOWER EXTREMITY Common Femoral Vein: Occlusive thrombus identified. Saphenofemoral Junction: Occlusive thrombus identified. Profunda Femoral Vein: Occlusive thrombus identified. Femoral Vein: Occlusive thrombus identified. Popliteal Vein: Occlusive thrombus identified. Calf Veins: Not visualized. Superficial Great Saphenous Vein: Not reported. Other Findings:  None. IMPRESSION: Extensive bilateral deep venous thrombosis. In the left leg, occlusive clot is seen from the common femoral through the popliteal vein. On the right, occlusive thrombus is seen from the femoral through the popliteal vein. Critical Value/emergent results were called by telephone at the time of interpretation on 05/08/2016 at 3:47 pm to Dr. [name] , who verbally acknowledged these results. Electronically Signed   By: Inge Rise M.D.   On: 05/08/2016 15:49   Mr Jodene Nam Head/brain Wo Cm  05/14/2016  CLINICAL DATA:  Confusion, LEFT vision changes. Follow-up acute stroke. History of locally metastatic bronchogenic carcinoma, hypercoagulable state, hypertension. EXAM: MRI HEAD WITHOUT AND WITH CONTRAST MRA HEAD WITHOUT CONTRAST TECHNIQUE: Multiplanar, multiecho pulse sequences of the brain and surrounding structures were obtained without and with intravenous contrast. Angiographic images of the head were obtained using MRA technique without contrast. CONTRAST:  65m MULTIHANCE GADOBENATE DIMEGLUMINE 529 MG/ML IV SOLN COMPARISON:  CT HEAD May 13, 2016 FINDINGS: MRI HEAD FINDINGS INTRACRANIAL  CONTENTS: Confluent medial RIGHT temporal occipital reduced diffusion with low ADC values and intermediate to bright T2 FLAIR signal. Scattered subcentimeter foci of reduced diffusion LEFT occipital lobe, bilateral cerebellum in addition to 16 x 9 mm focus of reduced diffusion RIGHT cerebellum with low ADC value. At least 2 subcentimeter focus of reduced diffusion bifrontal lobes no susceptibility artifact is suggests intraparenchymal hemorrhage. Linear reduced diffusion RIGHT frontal extra-axial space with bright FLAIR signal and intrinsic T1 shortening. Ventricles and sulci are normal for patient's age. Minimal white matter changes most compatible with chronic small vessel ischemic disease are less than expected for age. No intraparenchymal masses, mass effect or abnormal enhancement. ORBITS: The included ocular globes and orbital contents are non-suspicious. SINUSES: The mastoid air-cells and included paranasal sinuses are well-aerated. SKULL/SOFT TISSUES: No abnormal sellar expansion. No suspicious calvarial bone marrow signal. Generalized bright T1 bone marrow signal most compatible with osteopenia. Craniocervical junction maintained. Sub cm probable lymph node RIGHT parotid gland. Patient is edentulous.  MRA HEAD FINDINGS ANTERIOR CIRCULATION: Normal flow related enhancement of the included cervical, petrous, cavernous and supraclinoid internal carotid arteries. Patent anterior communicating artery. Normal flow related enhancement of the anterior and middle cerebral arteries, including distal segments. No large vessel occlusion, high-grade stenosis, abnormal luminal irregularity, aneurysm. POSTERIOR CIRCULATION: RIGHT vertebral artery is dominant. Basilar artery is patent, with normal flow related enhancement of the main branch vessels. Robust LEFT and small RIGHT posterior communicating arteries present. Occluded RIGHT proximal P2 segment. No high-grade stenosis, abnormal luminal irregularity, aneurysm.  IMPRESSION: MRI HEAD: Acute large RIGHT posterior cerebral artery territory infarct (RIGHT temporal occipital lobe). Additional small acute infarcts bilateral cerebella, LEFT occipital lobe and bifrontal lobes. Findings most consistent with embolic phenomena. Trace subacute RIGHT frontal subdural hematoma. No MR findings of intracranial metastasis. MRA HEAD: RIGHT posterior cerebral artery occlusion (proximal P2 segment). Otherwise negative MRA head. These results will be called to the ordering clinician or representative by the Radiologist Assistant, and communication documented in the PACS or zVision Dashboard. Electronically Signed   By: Elon Alas M.D.   On: 05/14/2016 22:27     Microbiology: No results found for this or any previous visit (from the past 240 hour(s)).   Labs: Basic Metabolic Panel:  Recent Labs Lab 05/10/16 1301 05/13/16 2246 05/15/16 0821  NA 143 140 139  K 4.3 4.0 4.0  CL  --  112* 108  CO2 19* 20* 20*  GLUCOSE 107 89 101*  BUN 26.4* 21* 16  CREATININE 1.3 1.43* 1.38*  CALCIUM 8.5 8.8* 8.9   Liver Function Tests:  Recent Labs Lab 05/10/16 1301 05/13/16 2246  AST 22 18  ALT 12 10*  ALKPHOS 79 67  BILITOT 0.40 0.5  PROT 6.8 6.2*  ALBUMIN 2.7* 2.8*   No results for input(s): LIPASE, AMYLASE in the last 168 hours. No results for input(s): AMMONIA in the last 168 hours. CBC:  Recent Labs Lab 05/13/16 2246 05/15/16 0821 05/16/16 0638  WBC 13.2* 14.2* 12.9*  HGB 8.5* 9.7* 8.5*  HCT 26.7* 29.7* 25.8*  MCV 89.0 90.3 88.7  PLT 283 312 299   Cardiac Enzymes: No results for input(s): CKTOTAL, CKMB, CKMBINDEX, TROPONINI in the last 168 hours. BNP: Invalid input(s): POCBNP CBG:  Recent Labs Lab 05/14/16 0031  GLUCAP 87    Time coordinating discharge:  Greater than 30 minutes  Signed:  Vernell Leep, MD, FACP, FHM. [facility] Pager [phone]  If 7PM-7AM, please contact night-coverage [email or web address] Password  [email or web address] 05/16/2016, 1:18 PM

## 2016-05-17 ENCOUNTER — Other Ambulatory Visit: Payer: Self-pay | Admitting: Family

## 2016-05-17 DIAGNOSIS — R918 Other nonspecific abnormal finding of lung field: Secondary | ICD-10-CM

## 2016-05-21 ENCOUNTER — Ambulatory Visit (INDEPENDENT_AMBULATORY_CARE_PROVIDER_SITE_OTHER): Payer: Commercial Managed Care - HMO | Admitting: Osteopathic Medicine

## 2016-05-21 ENCOUNTER — Telehealth: Payer: Self-pay

## 2016-05-21 ENCOUNTER — Encounter: Payer: Self-pay | Admitting: Osteopathic Medicine

## 2016-05-21 VITALS — BP 126/74 | HR 76 | Wt 142.0 lb

## 2016-05-21 DIAGNOSIS — C3490 Malignant neoplasm of unspecified part of unspecified bronchus or lung: Secondary | ICD-10-CM

## 2016-05-21 DIAGNOSIS — I63531 Cerebral infarction due to unspecified occlusion or stenosis of right posterior cerebral artery: Secondary | ICD-10-CM | POA: Diagnosis not present

## 2016-05-21 DIAGNOSIS — D6489 Other specified anemias: Secondary | ICD-10-CM

## 2016-05-21 DIAGNOSIS — I82493 Acute embolism and thrombosis of other specified deep vein of lower extremity, bilateral: Secondary | ICD-10-CM | POA: Diagnosis not present

## 2016-05-21 DIAGNOSIS — Z79899 Other long term (current) drug therapy: Secondary | ICD-10-CM | POA: Diagnosis not present

## 2016-05-21 DIAGNOSIS — Z7901 Long term (current) use of anticoagulants: Secondary | ICD-10-CM

## 2016-05-21 DIAGNOSIS — E46 Unspecified protein-calorie malnutrition: Secondary | ICD-10-CM

## 2016-05-21 DIAGNOSIS — Z5181 Encounter for therapeutic drug level monitoring: Secondary | ICD-10-CM

## 2016-05-21 DIAGNOSIS — Z7189 Other specified counseling: Secondary | ICD-10-CM

## 2016-05-21 DIAGNOSIS — N183 Chronic kidney disease, stage 3 unspecified: Secondary | ICD-10-CM

## 2016-05-21 MED ORDER — ATORVASTATIN CALCIUM 10 MG PO TABS: 10.0000 mg | ORAL_TABLET | Freq: Every day | ORAL | Status: AC

## 2016-05-21 NOTE — Telephone Encounter (Signed)
Verbal order has been given. Marquin Patino,CMA

## 2016-05-21 NOTE — Telephone Encounter (Signed)
That's fine, let me know if any orders need signed

## 2016-05-21 NOTE — Telephone Encounter (Signed)
Debbie from [facility] called and requested  1 time 1 weeks and 2 times a weeks for 3 weeks. Chalee Hirota,CMA

## 2016-05-21 NOTE — [facility] Notes (Signed)
HPI: Henry Ford is a 81 y.o. male who presents to [facility] today for chief complaint of:  Chief Complaint  Patient presents with  . Hospitalization Follow-up    STROKE    Admitted 05/13/16 - 05/16/16 Discharge summary reviewed from 05/13/16 encounter.  Outpatient follow-up plan:   1. Dr. [name], PCP in 5 days with repeat labs (CBC & BMP). 2. Dr. Burney Gauze, Oncology in 1 week. 3. Dr. Baltazar Apo, Pulmonology in 2 weeks. 4. Dr. Antony Contras, Neurology in 2 months.  Right PCA Stroke - likely embolic, no mets on MRI, Lipitor initiated, Eliquis switched to Lovenox, following with neuro, ST/PT/OT at home  DVT bilateral legs, PE - Lovenox 106 mg q 24 h based on family preference (renal function?)  Bronchogenic carcinoma - PET scan this week, Onc appt next week, plan for bronchosopy during hospital stay but this was postponed, following with Pulm   Acute metabolic encephalopathy - resolved  Moderate Protein-calorie malnutrition - no swallowing difficulty post-CVA, low appetite, I believe depression about his diagnosis is affecting this, wife says he likes [employer]   CKD stage III Cr 1.43 at time of admission, consistent with his apparent baseline. Avoid nephrotoxins, renally-dose medications   HTN - continue Hytrin  BPH - continue current management with finasteride and terazosin   Normocytic anemia - Hgb 8.5 on admission  CODE STATUS - discussion of advanced directives today with patinet and wife, see below    Past medical, social and family history reviewed: Past Medical History  Diagnosis Date  . Hypertension   . Prostate enlargement   . Peripheral vascular disease ([place]) 12/2015    non-healing ulcer right Hallux. S/P angioplasty and stenting of right common iliac 01/02/2016.  . Right foot pain 12/30/2015    Admitted to [facility] 12/11/2015, discharge 12/14/2015, diagnoses included acute right great toe  infection/cellulitis, peripheral vascular disease. ABI demonstrated arterial occlusive disease worse on left. Is referred outpatient to vascular The CT scan documents for copy of his discharge summary. He should follow up with Dr. Ladona Horns, general surgery at [facility]. No dated 02/06/2  . Anemia 12/2015  . Colon polyps ~ 2001    unknown pathology, colonoscopy done at outside hospital.   . Heme positive stool 12/2015  . Gastrointestinal hemorrhage with melena     Status post EGD and blood transfusion March 2017   . Ischemic foot ulcer due to atherosclerosis of native artery of limb ([place]) 01/10/2016  . Cancer ([place])     lung   Past Surgical History  Procedure Laterality Date  . Hydrocele excision  1956  . Peripheral vascular catheterization N/A 01/02/2016    Procedure: Abdominal Aortogram w/Lower Extremity;  Surgeon: Angelia Mould, MD;  Location: [facility];  Service: Cardiovascular;  Laterality: N/A;  . Peripheral vascular catheterization  01/02/2016    Procedure: Peripheral Vascular Intervention;  Surgeon: Angelia Mould, MD;  Location: [facility];  Service: Cardiovascular;;  . Esophagogastroduodenoscopy N/A 01/21/2016    Procedure: ESOPHAGOGASTRODUODENOSCOPY (EGD);  Surgeon: Irene Shipper, MD;  Location: [facility];  Service: Endoscopy;  Laterality: N/A;   Social History  Substance Use Topics  . Smoking status: Former Smoker -- 0.25 packs/day for 60 years    Types: Cigarettes    Start date: 07/03/1950    Quit date: 01/20/2016  . Smokeless tobacco: Never Used  . Alcohol Use: 6.0 oz/week    10 Cans of beer, 0 Standard drinks  or equivalent per week     Comment: beer   Family History  Problem Relation Age of Onset  . Colon cancer Neg Hx   . Diabetes Brother     Current Outpatient Prescriptions  Medication Sig Dispense Refill  . enoxaparin (LOVENOX) 120 MG/0.8ML injection Inject 0.71 mLs (106 mg total) into the skin every evening. Take  daily at 6 pm starting 05/16/2016. 30 Syringe 0  . ferrous sulfate 325 (65 FE) MG tablet Take 325 mg by mouth 2 (two) times daily with a meal.    . finasteride (PROSCAR) 5 MG tablet Take 5 mg by mouth daily. Reported on 02/01/2016    . Multiple Vitamin (MULTIVITAMIN) tablet Take 1 tablet by mouth daily.    . Omega 3 1000 MG CAPS Take 1,000 mg by mouth daily.    . pantoprazole (PROTONIX) 40 MG tablet Take 40 mg by mouth daily.     . Red Yeast Rice 600 MG CAPS Take 600 mg by mouth daily.    Marland Kitchen terazosin (HYTRIN) 10 MG capsule Take 10 mg by mouth at bedtime. Reported on 01/20/2016    . vitamin B-12 (CYANOCOBALAMIN) 1000 MCG tablet Take 1,000 mcg by mouth daily.    . vitamin C (ASCORBIC ACID) 500 MG tablet Take 1,000 mg by mouth daily.    Marland Kitchen atorvastatin (LIPITOR) 10 MG tablet Take 1 tablet (10 mg total) by mouth daily at 6 PM. (Patient not taking: Reported on 05/21/2016) 30 tablet 1  . co-enzyme Q-10 30 MG capsule Take 30 mg by mouth daily.     No current facility-administered medications for this visit.   No Known Allergies    Review of Systems: CONSTITUTIONAL:  No  fever, no chills, No recent illness, (+) unintentional weight changes - worsened weight loss HEAD/EYES/EARS/NOSE/THROAT: No  headache, (+) vision change since stroke, no hearing change CARDIAC: No  chest pain, No  pressure, No palpitations, No  orthopnea RESPIRATORY: No  cough GASTROINTESTINAL: No  nausea, No  vomiting, No  abdominal pain, No  blood in stool NEUROLOGIC: (+) generalized  weakness, No  dizziness, No  slurred speech, no swallowing difficulty   Exam:  BP 126/74 mmHg  Pulse 76  Wt 142 lb (64.411 kg) Constitutional: VS see above. General Appearance: alert, well-developed, thin, NAD Respiratory: Normal respiratory effort.  Psychiatric: Normal judgment/insight. Depressed mood and affect. Oriented x3.    No results found for this or any previous visit (from the past 72 hour(s)).  No results  found.   ASSESSMENT/PLAN:  Acute right PCA stroke (HCC) - following with neurology, therapy at home. Was off Lipitor due to concern for interaction w/ red yeast rice - advised restart for secondary prevent ASCVD  Medication management - Plan: COMPLETE METABOLIC PANEL WITH GFR, CBC with Differential/Platelet  Anticoagulation management encounter - Lovenox daily, was on Eliquis for DVT but had likely embolic CVA  Deep vein thrombosis (DVT) of other vein of both lower extremities, unspecified chronicity (HCC)  Malignant neoplasm of lung, unspecified laterality, unspecified part of lung ([place]) - Following with Pulm - bronchoscopy/biopsy cancelled inpatient, plan to do outpatient, PET and Onc followup upcoming  Malnutrition ([place]) - no calorie restrictions!   CKD (chronic kidney disease) stage 3, GFR 30-59 ml/min - Plan: COMPLETE METABOLIC PANEL WITH GFR  Anemia due to other cause - Plan: CBC with Differential/Platelet  Advance directive discussed with patient - info given on Living Will / Advanced Directive - let me know if ANY questions. If desires DNR or  portable order (MOST/POST), I can help with this.      All questions were answered. Visit summary with medication list and pertinent instructions was printed for patient to review. ER/RTC precautions were reviewed with the patient. Keep upcoming appt unless need me sooner.   Total time spent 40 minutes, greater than 50% of the visit was face-to-face with patient and his wife, counseling and coordinating care for diagnosis of cancer, advanced directive planning, CKD, DVT, medication management.

## 2016-05-22 LAB — CBC WITH DIFFERENTIAL/PLATELET
BASOS ABS: 0 {cells}/uL (ref 0–200)
BASOS PCT: 0 %
EOS PCT: 1 %
Eosinophils Absolute: 119 cells/uL (ref 15–500)
HCT: 29.1 % — ABNORMAL LOW (ref 38.5–50.0)
Hemoglobin: 9.5 g/dL — ABNORMAL LOW (ref 13.2–17.1)
Lymphocytes Relative: 22 %
Lymphs Abs: 2618 cells/uL (ref 850–3900)
MCH: 29.4 pg (ref 27.0–33.0)
MCHC: 32.6 g/dL (ref 32.0–36.0)
MCV: 90.1 fL (ref 80.0–100.0)
MONOS PCT: 10 %
MPV: 9.2 fL (ref 7.5–12.5)
Monocytes Absolute: 1190 cells/uL — ABNORMAL HIGH (ref 200–950)
NEUTROS ABS: 7973 {cells}/uL — AB (ref 1500–7800)
Neutrophils Relative %: 67 %
PLATELETS: 281 10*3/uL (ref 140–400)
RBC: 3.23 MIL/uL — ABNORMAL LOW (ref 4.20–5.80)
RDW: 14.7 % (ref 11.0–15.0)
WBC: 11.9 10*3/uL — AB (ref 3.8–10.8)

## 2016-05-22 LAB — COMPLETE METABOLIC PANEL WITH GFR
ALBUMIN: 3.6 g/dL (ref 3.6–5.1)
ALK PHOS: 74 U/L (ref 40–115)
ALT: 10 U/L (ref 9–46)
AST: 21 U/L (ref 10–35)
BILIRUBIN TOTAL: 0.4 mg/dL (ref 0.2–1.2)
BUN: 27 mg/dL — ABNORMAL HIGH (ref 7–25)
CALCIUM: 9 mg/dL (ref 8.6–10.3)
CO2: 23 mmol/L (ref 20–31)
Chloride: 108 mmol/L (ref 98–110)
Creat: 1.47 mg/dL — ABNORMAL HIGH (ref 0.70–1.18)
GFR, EST AFRICAN AMERICAN: 52 mL/min — AB (ref >=60)
GFR, EST NON AFRICAN AMERICAN: 45 mL/min — AB (ref >=60)
Glucose, Bld: 91 mg/dL (ref 65–99)
Potassium: 4.7 mmol/L (ref 3.5–5.3)
Sodium: 141 mmol/L (ref 135–146)
TOTAL PROTEIN: 6.7 g/dL (ref 6.1–8.1)

## 2016-05-23 ENCOUNTER — Telehealth: Payer: Self-pay

## 2016-05-23 NOTE — Telephone Encounter (Signed)
Patient wife called stated that patient has been feeling down a lot lately and he is talking negative she wants to know if there is something you can prescribe him for depression that will not counteract with rest of the medications that he is taking. Manjot Beumer,CMA

## 2016-05-24 MED ORDER — FLUOXETINE HCL 20 MG PO TABS: 20.0000 mg | ORAL_TABLET | Freq: Every day | ORAL | Status: AC

## 2016-05-24 NOTE — Telephone Encounter (Signed)
Spoke to patient advised her as noted below. Rhonda Cunningham,CMA

## 2016-05-24 NOTE — Telephone Encounter (Signed)
I am fine to go ahead and call something in for him. Calling in generic antidepressant medication. I wouldn't anticipate him to have many problems with this medicine, nausea is a common side effect when starting the medication, I need to know right away if he is having any significant mood changes or thoughts of hurting himself.   Please also let Mrs. Corson know that I'm happy to refer for counseling, no that this diagnosis has been difficult for him to deal with and I think that talking to somebody might help with this. Let me know if this is something we could maybe convince the patient to consider.

## 2016-05-25 ENCOUNTER — Ambulatory Visit (HOSPITAL_COMMUNITY)
Admission: RE | Admit: 2016-05-25 | Discharge: 2016-05-25 | Disposition: A | Discharge status: Home / Self Care | Payer: Commercial Managed Care - HMO | Source: Ambulatory Visit | Attending: Hematology & Oncology | Admitting: Hematology & Oncology

## 2016-05-25 DIAGNOSIS — C771 Secondary and unspecified malignant neoplasm of intrathoracic lymph nodes: Secondary | ICD-10-CM | POA: Insufficient documentation

## 2016-05-25 DIAGNOSIS — R918 Other nonspecific abnormal finding of lung field: Secondary | ICD-10-CM

## 2016-05-25 LAB — GLUCOSE, CAPILLARY: GLUCOSE-CAPILLARY: 65 mg/dL (ref 65–99)

## 2016-05-25 MED ORDER — FLUDEOXYGLUCOSE F - 18 (FDG) INJECTION
7.0200 | Freq: Once | INTRAVENOUS | Status: AC | PRN
  Administered 2016-05-25: 7.02 via INTRAVENOUS

## 2016-05-30 ENCOUNTER — Other Ambulatory Visit (HOSPITAL_BASED_OUTPATIENT_CLINIC_OR_DEPARTMENT_OTHER): Payer: Commercial Managed Care - HMO

## 2016-05-30 ENCOUNTER — Ambulatory Visit (HOSPITAL_BASED_OUTPATIENT_CLINIC_OR_DEPARTMENT_OTHER): Payer: Commercial Managed Care - HMO | Admitting: Hematology & Oncology

## 2016-05-30 ENCOUNTER — Other Ambulatory Visit: Payer: Self-pay | Admitting: Nurse Practitioner

## 2016-05-30 ENCOUNTER — Encounter: Payer: Self-pay | Admitting: Hematology & Oncology

## 2016-05-30 VITALS — BP 106/59 | HR 87 | Temp 97.5°F | Resp 18 | Ht 68.0 in | Wt 141.0 lb

## 2016-05-30 DIAGNOSIS — D75839 Thrombocytosis, unspecified: Secondary | ICD-10-CM

## 2016-05-30 DIAGNOSIS — I2699 Other pulmonary embolism without acute cor pulmonale: Secondary | ICD-10-CM | POA: Diagnosis not present

## 2016-05-30 DIAGNOSIS — D508 Other iron deficiency anemias: Secondary | ICD-10-CM

## 2016-05-30 DIAGNOSIS — C3491 Malignant neoplasm of unspecified part of right bronchus or lung: Secondary | ICD-10-CM

## 2016-05-30 DIAGNOSIS — R599 Enlarged lymph nodes, unspecified: Secondary | ICD-10-CM

## 2016-05-30 DIAGNOSIS — R918 Other nonspecific abnormal finding of lung field: Secondary | ICD-10-CM

## 2016-05-30 DIAGNOSIS — D72829 Elevated white blood cell count, unspecified: Secondary | ICD-10-CM

## 2016-05-30 DIAGNOSIS — N183 Chronic kidney disease, stage 3 unspecified: Secondary | ICD-10-CM

## 2016-05-30 DIAGNOSIS — D473 Essential (hemorrhagic) thrombocythemia: Secondary | ICD-10-CM

## 2016-05-30 DIAGNOSIS — D631 Anemia in chronic kidney disease: Secondary | ICD-10-CM

## 2016-05-30 DIAGNOSIS — I82401 Acute embolism and thrombosis of unspecified deep veins of right lower extremity: Secondary | ICD-10-CM

## 2016-05-30 LAB — CBC WITH DIFFERENTIAL (CANCER CENTER ONLY)
BASO#: 0.1 10*3/uL (ref 0.0–0.2)
BASO%: 0.7 % (ref 0.0–2.0)
EOS%: 1.1 % (ref 0.0–7.0)
Eosinophils Absolute: 0.2 10*3/uL (ref 0.0–0.5)
HEMATOCRIT: 28.8 % — AB (ref 38.7–49.9)
HGB: 9.3 g/dL — ABNORMAL LOW (ref 13.0–17.1)
LYMPH#: 2.5 10*3/uL (ref 0.9–3.3)
LYMPH%: 18.6 % (ref 14.0–48.0)
MCH: 30.1 pg (ref 28.0–33.4)
MCHC: 32.3 g/dL (ref 32.0–35.9)
MCV: 93 fL (ref 82–98)
MONO#: 1.4 10*3/uL — AB (ref 0.1–0.9)
MONO%: 10.8 % (ref 0.0–13.0)
NEUT#: 9.1 10*3/uL — ABNORMAL HIGH (ref 1.5–6.5)
NEUT%: 68.8 % (ref 40.0–80.0)
PLATELETS: 316 10*3/uL (ref 145–400)
RBC: 3.09 10*6/uL — AB (ref 4.20–5.70)
RDW: 14.8 % (ref 11.1–15.7)
WBC: 13.2 10*3/uL — AB (ref 4.0–10.0)

## 2016-05-30 LAB — COMPREHENSIVE METABOLIC PANEL (CC13)
A/G RATIO: 1 — AB (ref 1.2–2.2)
ALT: 11 IU/L (ref 0–44)
AST (SGOT): 18 IU/L (ref 0–40)
Albumin, Serum: 3.4 g/dL — ABNORMAL LOW (ref 3.5–4.8)
Alkaline Phosphatase, S: 76 IU/L (ref 39–117)
BUN/Creatinine Ratio: 19 (ref 10–24)
BUN: 28 mg/dL — ABNORMAL HIGH (ref 8–27)
Bilirubin Total: 0.2 mg/dL (ref 0.0–1.2)
CALCIUM: 9 mg/dL (ref 8.6–10.2)
CO2: 22 mmol/L (ref 18–29)
CREATININE: 1.49 mg/dL — AB (ref 0.76–1.27)
Chloride, Ser: 105 mmol/L (ref 96–106)
GFR, EST AFRICAN AMERICAN: 51 mL/min/{1.73_m2} — AB (ref >59)
GFR, EST NON AFRICAN AMERICAN: 44 mL/min/{1.73_m2} — AB (ref >59)
GLOBULIN, TOTAL: 3.4 g/dL (ref 1.5–4.5)
Glucose: 100 mg/dL — ABNORMAL HIGH (ref 65–99)
POTASSIUM: 4.5 mmol/L (ref 3.5–5.2)
Sodium: 135 mmol/L (ref 134–144)
TOTAL PROTEIN: 6.8 g/dL (ref 6.0–8.5)

## 2016-05-30 MED ORDER — FLUCONAZOLE 100 MG PO TABS: 100.0000 mg | ORAL_TABLET | Freq: Every day | ORAL | Status: DC

## 2016-05-30 MED ORDER — PREDNISONE 20 MG PO TABS: 20.0000 mg | ORAL_TABLET | Freq: Every day | ORAL | Status: AC

## 2016-05-30 NOTE — [facility] Notes (Signed)
Hematology and Oncology Follow Up Visit  Oluwatomiwa Kinyon [id number] Nov 26, 1934 81 y.o. 05/30/2016   Principle Diagnosis:   Metastatic bronchogenic carcinoma  RIND  Pulmonary embolism/RIGHT leg DVT  Hypercoagulable due to malignancy   Current Therapy:    Lovenox '120mg'$  sq q day     Interim History:  Mr. Obey is back for a second office visit. We first saw him back on June 22. Since then, he really has had a tough time. He isn't presented with blood clots. We had him on ELIQUIS area and he then proceeded to have a TIA/RIND. We then placed him on Lovenox. Because this, he did not have a bronchoscopy.  We did go ahead and get a PET scan on him. This was done on July 7. The PET scan showed that he had bilateral pulmonary nodules that were metabolic. He had a dominant mass the right upper lobe measuring 2.8 x 3.2 cm. He had mediastinal and hilar adenopathy. Surprisingly, there is no disease below the diaphragm. He had a right supraclavicular lymph node.  His overall condition just is not fact great. He's not that hungry. He did not want take Marinol. I will try him on some prednisone at 20 mg a day to see if this helps with his appetite. When we first saw him, his prealbumin was 15.  He just does not have much of an appetite.  He is not hurting. He is not coughing up anything. He's had no nausea or vomiting.  There is no obvious neurological issue that I can tell at this point.  He's had no fever. He's had no diarrhea.  Overall, his performance status is ECOG 2-3. Medications:  Current outpatient prescriptions:  .  atorvastatin (LIPITOR) 10 MG tablet, Take 1 tablet (10 mg total) by mouth daily at 6 PM., Disp: 30 tablet, Rfl: 1 .  co-enzyme Q-10 30 MG capsule, Take 30 mg by mouth daily., Disp: , Rfl:  .  enoxaparin (LOVENOX) 120 MG/0.8ML injection, Inject 0.71 mLs (106 mg total) into the skin every evening. Take daily at 6 pm starting 05/16/2016., Disp: 30 Syringe, Rfl: 0 .   finasteride (PROSCAR) 5 MG tablet, Take 5 mg by mouth daily. Reported on 02/01/2016, Disp: , Rfl:  .  FLUoxetine (PROZAC) 20 MG tablet, Take 1 tablet (20 mg total) by mouth daily., Disp: 30 tablet, Rfl: 3 .  Multiple Vitamin (MULTIVITAMIN) tablet, Take 1 tablet by mouth daily., Disp: , Rfl:  .  Omega 3 1000 MG CAPS, Take 1,000 mg by mouth daily., Disp: , Rfl:  .  pantoprazole (PROTONIX) 40 MG tablet, Take 40 mg by mouth daily. , Disp: , Rfl:  .  Red Yeast Rice 600 MG CAPS, Take 600 mg by mouth daily., Disp: , Rfl:  .  terazosin (HYTRIN) 10 MG capsule, Take 10 mg by mouth at bedtime. Reported on 01/20/2016, Disp: , Rfl:  .  vitamin B-12 (CYANOCOBALAMIN) 1000 MCG tablet, Take 1,000 mcg by mouth daily., Disp: , Rfl:  .  vitamin C (ASCORBIC ACID) 500 MG tablet, Take 1,000 mg by mouth daily., Disp: , Rfl:  .  fluconazole (DIFLUCAN) 100 MG tablet, Take 1 tablet (100 mg total) by mouth daily., Disp: 30 tablet, Rfl: 5 .  predniSONE (DELTASONE) 20 MG tablet, Take 1 tablet (20 mg total) by mouth daily with breakfast., Disp: 30 tablet, Rfl: 3  Allergies: No Known Allergies  Past Medical History, Surgical history, Social history, and Family History were reviewed and updated.  Review of Systems:  As above  Physical Exam:  height is '5\' 8"'$  (1.727 m) and weight is 141 lb (63.957 kg). His oral temperature is 97.5 F (36.4 C). His blood pressure is 106/59 and his pulse is 87. His respiration is 18.   Wt Readings from Last 3 Encounters:  05/30/16 141 lb (63.957 kg)  05/21/16 142 lb (64.411 kg)  05/14/16 156 lb (70.761 kg)      Chronically ill-appearing white male in no obvious distress. Head and neck exam shows some temporal muscle wasting. He has no oral lesions. I cannot palpate any adenopathy on his neck. He has no palpable thyroid. Lungs are with some decrease at the bases. Cardiac exam regular rate and rhythm with no murmurs, rubs or bruits. Abdomen is soft. Abdomen is without fluid. There is no guarding  or rebound tenderness. There is no palpable liver or spleen tip. Back exam shows no tenderness over the spine, ribs or hips. Extremities shows some 2+ edema from the left foot up halfway the left lower leg. There is no erythema. There is no warmth. I cannot palpate any obvious venous cord. He has decent range of motion of his joints. Neurological exam shows no obvious neurological deficits.  Lab Results  Component Value Date   WBC 13.2* 05/30/2016   HGB 9.3* 05/30/2016   HCT 28.8* 05/30/2016   MCV 93 05/30/2016   PLT 316 05/30/2016     Chemistry      Component Value Date/Time   NA 141 05/21/2016 1501   NA 143 05/10/2016 1301   K 4.7 05/21/2016 1501   K 4.3 05/10/2016 1301   CL 108 05/21/2016 1501   CO2 23 05/21/2016 1501   CO2 19* 05/10/2016 1301   BUN 27* 05/21/2016 1501   BUN 26.4* 05/10/2016 1301   CREATININE 1.47* 05/21/2016 1501   CREATININE 1.38* 05/15/2016 0821   CREATININE 1.3 05/10/2016 1301      Component Value Date/Time   CALCIUM 9.0 05/21/2016 1501   CALCIUM 8.5 05/10/2016 1301   ALKPHOS 74 05/21/2016 1501   ALKPHOS 79 05/10/2016 1301   AST 21 05/21/2016 1501   AST 22 05/10/2016 1301   ALT 10 05/21/2016 1501   ALT 12 05/10/2016 1301   BILITOT 0.4 05/21/2016 1501   BILITOT 0.40 05/10/2016 1301         Impression and Plan: Mr. Fix is a 81 year old white maleWho appears to have a bronchogenic carcinoma. This clearly is stage IV by his PET scan.  The real problem that we have now is the fact that we need to diagnosis. We were supposed to get this with a bronchoscopy but then he had this neurological event and the pulmonologist did not feel it was safe to do a bronchoscopy.  He sees the pulmonologist on Monday, July 17.  It is virtually impossible to treat him unless we have a diagnosis. We may not be able to get a diagnosis. He does not have a great performance status. He may not even be a candidate for any intervention. Our best outcome would be small  cell lung cancer which would be reasonable.  If he cannot get a biopsy with bronchoscopy, then maybe one of the radiologist can get a percutaneous biopsy for [place].  Again, his performance status is what will dictate his future. If we cannot get him to a better, then I just do not think we are going be able to treat him no matter what he has.  I spent about 45 minutes with  he and his wife. I think they understand very well what is going on in this situation. Mr. Moctezuma asked me how longer I thought he had. I told him that if we cannot do any treatment or if treatment does not help, and he will not make it through the summer time. I told him that he is going to have to eat better if he wants to have a chance.  We will keep him on Lovenox. He will need Lovenox for as long as he lives from my point of view.  I will like to get him back in 2 weeks. By then, we will know whether or not he had a bronchoscopy. I told his wife let me know what the pulmonologist has to say.   Volanda Napoleon, MD 7/12/20174:45 PM

## 2016-05-31 ENCOUNTER — Ambulatory Visit: Payer: Commercial Managed Care - HMO | Admitting: Osteopathic Medicine

## 2016-05-31 LAB — LACTATE DEHYDROGENASE: LDH: 293 U/L — AB (ref 125–245)

## 2016-05-31 LAB — PREALBUMIN: PREALBUMIN: 14 mg/dL (ref 9–32)

## 2016-06-04 ENCOUNTER — Ambulatory Visit (INDEPENDENT_AMBULATORY_CARE_PROVIDER_SITE_OTHER): Payer: Commercial Managed Care - HMO | Admitting: Adult Health

## 2016-06-04 ENCOUNTER — Encounter: Payer: Self-pay | Admitting: Adult Health

## 2016-06-04 VITALS — BP 140/76 | HR 75 | Temp 97.9°F | Ht 68.0 in | Wt 139.0 lb

## 2016-06-04 DIAGNOSIS — I2609 Other pulmonary embolism with acute cor pulmonale: Secondary | ICD-10-CM | POA: Diagnosis not present

## 2016-06-04 DIAGNOSIS — R918 Other nonspecific abnormal finding of lung field: Secondary | ICD-10-CM

## 2016-06-04 NOTE — Patient Instructions (Signed)
We are setting you up for a Biopsy with interventional Radiology . Follow up with Dr. Lamonte Sakai  In 6-8 weeks and As needed

## 2016-06-04 NOTE — Assessment & Plan Note (Signed)
Hypermetabolic RUL lung mass c/w Lung carcinoma with Mets evident on PET scan with supraclavicular, mediastinal /hilar adenopathy along with multiple pulmonary nodules.  His case is complex with recent hypercoagulable state with PE/bilateral DVT started on Eliquis . Despite this he developed an acute embolic CVA on Eliquis. Now on Lovenox.  Despite this will need to proceed to obtain tissue sampling - the least invasive site would be supraclavicular node on right with least amount of time off anticoagulation .  Discussed risks with pt and wife. Will consult IR regarding bx .  Case discussed with Dr. Lamonte Sakai  .

## 2016-06-04 NOTE — Progress Notes (Signed)
Subjective:    Patient ID: Henry Ford, male    DOB: 1935-08-08, 81 y.o.   MRN: [id number]  HPI 81 year old male former smoker with lung mass c/w  metastatic bronchogenic carcinoma (CT 05/07/16) .  Dx pulmonary embolism, Bilateral DVT 05/08/16     06/04/2016 Follow up ; Lung mass /PE  Patient returns for a post hospital follow-up, Patient was seen in the office 05/11/2016 for lung mass. CT chest showed a right upper lobe lesion measuring 3.2 x 2.6 cm. Lung with multiple pulmonary nodules suspicious for metastatic disease. Enlarged mediastinal and right hilar lymph nodes, dominant recurrent all night. Total mass measuring 3.9 x 2.4 cm. Right hilar node measuring 1.7 cm. Patient was set up for a EBUS on 05/15/16 . Unfortunately, patient was not able to undergo biopsy due to he was  admitted with acute right stroke on 6/25. (while on Elquis)  Prior to admission he was dx with PE and bilateral DVT on 05/08/16, started on Eliquis .  PET scan on 05/25/2016 showed a hypermetabolic dominant 3.2 cm spiculated mass in the right upper lobe, multiple smaller pulmonary nodules. Hypermetabolic supraclavicular, mediastinal and hilar lymphadenopathy. We discussed his results and best way to proceed for tissue biopsy for dx.  Case was reviewed with DR. Byrum.   Is starting to eat a little better. No chest pain, hemoptysis or fever.     Past Medical History  Diagnosis Date  . Hypertension   . Prostate enlargement   . Peripheral vascular disease ([place]) 12/2015    non-healing ulcer right Hallux. S/P angioplasty and stenting of right common iliac 01/02/2016.  . Right foot pain 12/30/2015    Admitted to [facility] 12/11/2015, discharge 12/14/2015, diagnoses included acute right great toe infection/cellulitis, peripheral vascular disease. ABI demonstrated arterial occlusive disease worse on left. Is referred outpatient to vascular The CT scan documents for copy of his discharge summary. He should follow up with Dr.  Ladona Horns, general surgery at [facility]. No dated 02/06/2  . Anemia 12/2015  . Colon polyps ~ 2001    unknown pathology, colonoscopy done at outside hospital.   . Heme positive stool 12/2015  . Gastrointestinal hemorrhage with melena     Status post EGD and blood transfusion March 2017   . Ischemic foot ulcer due to atherosclerosis of native artery of limb ([place]) 01/10/2016  . Cancer [facility])     lung   Current Outpatient Prescriptions on File Prior to Visit  Medication Sig Dispense Refill  . atorvastatin (LIPITOR) 10 MG tablet Take 1 tablet (10 mg total) by mouth daily at 6 PM. 30 tablet 1  . co-enzyme Q-10 30 MG capsule Take 30 mg by mouth daily.    Marland Kitchen enoxaparin (LOVENOX) 120 MG/0.8ML injection Inject 0.71 mLs (106 mg total) into the skin every evening. Take daily at 6 pm starting 05/16/2016. 30 Syringe 0  . finasteride (PROSCAR) 5 MG tablet Take 5 mg by mouth daily. Reported on 02/01/2016    . fluconazole (DIFLUCAN) 100 MG tablet Take 1 tablet (100 mg total) by mouth daily. 30 tablet 5  . FLUoxetine (PROZAC) 20 MG tablet Take 1 tablet (20 mg total) by mouth daily. 30 tablet 3  . Multiple Vitamin (MULTIVITAMIN) tablet Take 1 tablet by mouth daily.    . Omega 3 1000 MG CAPS Take 1,000 mg by mouth daily.    . pantoprazole (PROTONIX) 40 MG tablet Take 40 mg by mouth daily.     . predniSONE (DELTASONE) 20 MG tablet  Take 1 tablet (20 mg total) by mouth daily with breakfast. 30 tablet 3  . Red Yeast Rice 600 MG CAPS Take 600 mg by mouth daily.    Marland Kitchen terazosin (HYTRIN) 10 MG capsule Take 10 mg by mouth at bedtime. Reported on 01/20/2016    . vitamin B-12 (CYANOCOBALAMIN) 1000 MCG tablet Take 1,000 mcg by mouth daily.    . vitamin C (ASCORBIC ACID) 500 MG tablet Take 1,000 mg by mouth daily.     No current facility-administered medications on file prior to visit.      Review of Systems Constitutional:   No  weight loss, night sweats,  Fevers, chills,  +fatigue, or   lassitude.  HEENT:   No headaches,  Difficulty swallowing,  Tooth/dental problems, or  Sore throat,                No sneezing, itching, ear ache, nasal congestion, post nasal drip,   CV:  No chest pain,  Orthopnea, PND, swelling in lower extremities, anasarca, dizziness, palpitations, syncope.   GI  No heartburn, indigestion, abdominal pain, nausea, vomiting, diarrhea, change in bowel habits, loss of appetite, bloody stools.   Resp:    No excess mucus, no productive cough,  No non-productive cough,  No coughing up of blood.  No change in color of mucus.  No wheezing.  No chest wall deformity  Skin: no rash or lesions.  GU: no dysuria, change in color of urine, no urgency or frequency.  No flank pain, no hematuria   MS:  No joint pain or swelling.  No decreased range of motion.  No back pain.  Psych:  No change in mood or affect. No depression or anxiety.  No memory loss.         Objective:   Physical Exam  Filed Vitals:   06/04/16 1506  BP: 140/76  Pulse: 75  Temp: 97.9 F (36.6 C)  TempSrc: Oral  Height: '5\' 8"'$  (1.727 m)  Weight: 139 lb (63.05 kg)  SpO2: 95%   GEN: A/Ox3; pleasant , NAD, thin and frail   HEENT:  [place]/AT,  EACs-clear, TMs-wnl, NOSE-clear, THROAT-clear, no lesions, no postnasal drip or exudate noted.   NECK:  Supple w/ fair ROM; no JVD; normal carotid impulses w/o bruits; no thyromegaly or nodules palpated; no lymphadenopathy.  RESP  Decreased BS in bases , .no accessory muscle use, no dullness to percussion  CARD:  RRR, no m/r/g  , 1-2+ Bilateral L>R  peripheral edema, pulses intact, no cyanosis or clubbing.  GI:   Soft & nt; nml bowel sounds; no organomegaly or masses detected.  Musco: Warm bil, no deformities or joint swelling noted.   Neuro: alert, no focal deficits noted.    Skin: Warm, no lesions or rashes  PET scan 05/25/16/  Dominant 3.2 cm spiculated mass in posterior right upper lobe, suspicious for primary bronchogenic  carcinoma.  Multiple bilateral hypermetabolic pulmonary nodules, consistent with pulmonary metastases.  Hypermetabolic metastatic right hilar and mediastinal lymphadenopathy.  Hypermetabolic metastatic right supraclavicular lymphadenopathy.  No evidence of metastatic disease within the abdomen or pelvis. Reviewed independently.   Tammy Parrett NP-C  [facility]  06/04/2016

## 2016-06-05 ENCOUNTER — Telehealth: Payer: Self-pay | Admitting: *Deleted

## 2016-06-05 DIAGNOSIS — N183 Chronic kidney disease, stage 3 unspecified: Secondary | ICD-10-CM

## 2016-06-05 DIAGNOSIS — D631 Anemia in chronic kidney disease: Secondary | ICD-10-CM

## 2016-06-05 NOTE — Telephone Encounter (Signed)
Called spoke with pt. Reviewed pt's wife. Reviewed TP's recs. She voiced understanding and had no further questions. Nothing further needed at this time.

## 2016-06-05 NOTE — Telephone Encounter (Signed)
Biopsy has been set up for 06/07/16 Please hold Lovenox 1 day prior to this  I will call with path result when available  My nurse will call pt on exact date and time .

## 2016-06-05 NOTE — Telephone Encounter (Signed)
Patient's wife called stating they need a referral to [facility]  Dr. Jeffie Pollock. He was not able to keep most recent appt but has another appt coming up in October. Referral placed.  Also as FYI patient's wife states in the message that she feels the Prozac is helping .   The have not done the biopsy as of yet

## 2016-06-05 NOTE — Telephone Encounter (Signed)
-----   Message from Alecia Lemming sent at 06/05/2016  9:06 AM EDT ----- Regarding: US biopsy Pt is on Lovenox need to hold one day prior to biopsy,if someone can call patient have him hold blood thinners prior to biopsy.Biopsy can bed one 06-07-16'@[facility]'$ .Let me know you have called patient about blood thinners and i will call him to schedule biopsy.Thank  Elmo Putt

## 2016-06-06 ENCOUNTER — Other Ambulatory Visit: Payer: Self-pay | Admitting: Radiology

## 2016-06-06 ENCOUNTER — Telehealth: Payer: Self-pay | Admitting: Osteopathic Medicine

## 2016-06-06 DIAGNOSIS — N400 Enlarged prostate without lower urinary tract symptoms: Secondary | ICD-10-CM

## 2016-06-06 NOTE — Telephone Encounter (Signed)
Need to clarify request for referral to urology before signing the order   Urology would deal with diagnoses of  Prostate issues (pt has dx benign fibroma of prostate) but Nephrology would be the appropriate referral for chronic kidney disease (referral I'm being asked to sign is for urology for CKD).   Please clarify who requested the order and why? I can't find it in recent notes/encounters or recs for most recent visits to Pulm or HemOnc.

## 2016-06-06 NOTE — Telephone Encounter (Signed)
Opened in error

## 2016-06-06 NOTE — Telephone Encounter (Signed)
Thanks for keeping me in the loop.

## 2016-06-07 ENCOUNTER — Encounter (HOSPITAL_COMMUNITY): Payer: Self-pay

## 2016-06-07 ENCOUNTER — Telehealth: Payer: Self-pay | Admitting: Adult Health

## 2016-06-07 ENCOUNTER — Ambulatory Visit (HOSPITAL_COMMUNITY)
Admission: RE | Admit: 2016-06-07 | Discharge: 2016-06-07 | Disposition: A | Discharge status: Home / Self Care | Payer: Commercial Managed Care - HMO | Source: Ambulatory Visit | Attending: Adult Health | Admitting: Adult Health

## 2016-06-07 DIAGNOSIS — I739 Peripheral vascular disease, unspecified: Secondary | ICD-10-CM | POA: Diagnosis not present

## 2016-06-07 DIAGNOSIS — L97509 Non-pressure chronic ulcer of other part of unspecified foot with unspecified severity: Secondary | ICD-10-CM | POA: Diagnosis not present

## 2016-06-07 DIAGNOSIS — R918 Other nonspecific abnormal finding of lung field: Secondary | ICD-10-CM | POA: Diagnosis not present

## 2016-06-07 DIAGNOSIS — N400 Enlarged prostate without lower urinary tract symptoms: Secondary | ICD-10-CM | POA: Insufficient documentation

## 2016-06-07 DIAGNOSIS — Z87891 Personal history of nicotine dependence: Secondary | ICD-10-CM | POA: Diagnosis not present

## 2016-06-07 DIAGNOSIS — Z9889 Other specified postprocedural states: Secondary | ICD-10-CM | POA: Diagnosis not present

## 2016-06-07 DIAGNOSIS — Z86010 Personal history of colonic polyps: Secondary | ICD-10-CM | POA: Diagnosis not present

## 2016-06-07 DIAGNOSIS — D649 Anemia, unspecified: Secondary | ICD-10-CM | POA: Diagnosis not present

## 2016-06-07 DIAGNOSIS — F1010 Alcohol abuse, uncomplicated: Secondary | ICD-10-CM | POA: Insufficient documentation

## 2016-06-07 DIAGNOSIS — C801 Malignant (primary) neoplasm, unspecified: Secondary | ICD-10-CM | POA: Diagnosis not present

## 2016-06-07 DIAGNOSIS — I2609 Other pulmonary embolism with acute cor pulmonale: Secondary | ICD-10-CM | POA: Diagnosis present

## 2016-06-07 DIAGNOSIS — Z833 Family history of diabetes mellitus: Secondary | ICD-10-CM | POA: Insufficient documentation

## 2016-06-07 DIAGNOSIS — Z800 Family history of malignant neoplasm of digestive organs: Secondary | ICD-10-CM | POA: Diagnosis not present

## 2016-06-07 DIAGNOSIS — I10 Essential (primary) hypertension: Secondary | ICD-10-CM | POA: Insufficient documentation

## 2016-06-07 DIAGNOSIS — Z8673 Personal history of transient ischemic attack (TIA), and cerebral infarction without residual deficits: Secondary | ICD-10-CM | POA: Insufficient documentation

## 2016-06-07 DIAGNOSIS — C770 Secondary and unspecified malignant neoplasm of lymph nodes of head, face and neck: Secondary | ICD-10-CM | POA: Insufficient documentation

## 2016-06-07 HISTORY — DX: Cerebral infarction, unspecified: I63.9

## 2016-06-07 LAB — APTT: aPTT: 30 seconds (ref 24–37)

## 2016-06-07 LAB — CBC
HCT: 29.4 % — ABNORMAL LOW (ref 39.0–52.0)
Hemoglobin: 9.6 g/dL — ABNORMAL LOW (ref 13.0–17.0)
MCH: 29.4 pg (ref 26.0–34.0)
MCHC: 32.7 g/dL (ref 30.0–36.0)
MCV: 90.2 fL (ref 78.0–100.0)
Platelets: 276 10*3/uL (ref 150–400)
RBC: 3.26 MIL/uL — ABNORMAL LOW (ref 4.22–5.81)
RDW: 15.5 % (ref 11.5–15.5)
WBC: 18 10*3/uL — ABNORMAL HIGH (ref 4.0–10.5)

## 2016-06-07 LAB — PROTIME-INR
INR: 1.17 (ref 0.00–1.49)
Prothrombin Time: 15 seconds (ref 11.6–15.2)

## 2016-06-07 MED ORDER — MIDAZOLAM HCL 2 MG/2ML IJ SOLN
INTRAMUSCULAR | Status: AC
  Filled 2016-06-07: qty 4

## 2016-06-07 MED ORDER — FENTANYL CITRATE (PF) 100 MCG/2ML IJ SOLN
INTRAMUSCULAR | Status: AC
  Filled 2016-06-07: qty 4

## 2016-06-07 MED ORDER — SODIUM CHLORIDE 0.9 % IV SOLN
INTRAVENOUS | Status: DC
  Administered 2016-06-07: 11:00:00 via INTRAVENOUS

## 2016-06-07 MED ORDER — MIDAZOLAM HCL 2 MG/2ML IJ SOLN
INTRAMUSCULAR | Status: AC | PRN
  Administered 2016-06-07: 1 mg via INTRAVENOUS

## 2016-06-07 MED ORDER — FENTANYL CITRATE (PF) 100 MCG/2ML IJ SOLN
INTRAMUSCULAR | Status: AC | PRN
  Administered 2016-06-07: 50 ug via INTRAVENOUS

## 2016-06-07 NOTE — Telephone Encounter (Signed)
Called spoke with spouse and made aware of below. She verbalized understanding and had no further questions

## 2016-06-07 NOTE — Discharge Instructions (Signed)
Needle Biopsy, Care After °These instructions give you information about caring for yourself after your procedure. Your doctor may also give you more specific instructions. Call your doctor if you have any problems or questions after your procedure. °HOME CARE °· Rest as told by your doctor. °· Take medicines only as told by your doctor. °· There are many different ways to close and cover the biopsy site, including stitches (sutures), skin glue, and adhesive strips. Follow instructions from your doctor about: °¨ How to take care of your biopsy site. °¨ When and how you should change your bandage (dressing). °¨ When you should remove your dressing. °¨ Removing whatever was used to close your biopsy site. °· Check your biopsy site every day for signs of infection. Watch for: °¨ Redness, swelling, or pain. °¨ Fluid, blood, or pus. °GET HELP IF: °· You have a fever. °· You have redness, swelling, or pain at the biopsy site, and it lasts longer than a few days. °· You have fluid, blood, or pus coming from the biopsy site. °· You feel sick to your stomach (nauseous). °· You throw up (vomit). °GET HELP RIGHT AWAY IF: °· You are short of breath. °· You have trouble breathing. °· Your chest hurts. °· You feel dizzy or you pass out (faint). °· You have bleeding that does not stop with pressure or a bandage. °· You cough up blood. °· Your belly (abdomen) hurts. °  °This information is not intended to replace advice given to you by your health care provider. Make sure you discuss any questions you have with your health care provider. °  °Document Released: 10/18/2008 Document Revised: 03/22/2015 Document Reviewed: 11/01/2014 °Elsevier Interactive Patient Education ©2016 Elsevier Inc[employer] °Moderate Conscious Sedation, Adult, Care After °Refer to this sheet in the next few weeks. These instructions provide you with information on caring for yourself after your procedure. Your health care provider may also give you more specific  instructions. Your treatment has been planned according to current medical practices, but problems sometimes occur. Call your health care provider if you have any problems or questions after your procedure. °WHAT TO EXPECT AFTER THE PROCEDURE  °After your procedure: °· You may feel sleepy, clumsy, and have poor balance for several hours. °· Vomiting may occur if you eat too soon after the procedure. °HOME CARE INSTRUCTIONS °· Do not participate in any activities where you could become injured for at least 24 hours. Do not: °· Drive. °· Swim. °· Ride a bicycle. °· Operate heavy machinery. °· Cook. °· Use power tools. °· Climb ladders. °· Work from a high place. °· Do not make important decisions or sign legal documents until you are improved. °· If you vomit, drink water, juice, or soup when you can drink without vomiting. Make sure you have little or no nausea before eating solid foods. °· Only take over-the-counter or prescription medicines for pain, discomfort, or fever as directed by your health care provider. °· Make sure you and your family fully understand everything about the medicines given to you, including what side effects may occur. °· You should not drink alcohol, take sleeping pills, or take medicines that cause drowsiness for at least 24 hours. °· If you smoke, do not smoke without supervision. °· If you are feeling better, you may resume normal activities 24 hours after you were sedated. °· Keep all appointments with your health care provider. °SEEK MEDICAL CARE IF: °· Your skin is pale or bluish in color. °· You   continue to feel nauseous or vomit. °· Your pain is getting worse and is not helped by medicine. °· You have bleeding or swelling. °· You are still sleepy or feeling clumsy after 24 hours. °SEEK IMMEDIATE MEDICAL CARE IF: °· You develop a rash. °· You have difficulty breathing. °· You develop any type of allergic problem. °· You have a fever. °MAKE SURE YOU: °· Understand these  instructions. °· Will watch your condition. °· Will get help right away if you are not doing well or get worse. °  °This information is not intended to replace advice given to you by your health care provider. Make sure you discuss any questions you have with your health care provider. °  °Document Released: 08/26/2013 Document Revised: 11/26/2014 Document Reviewed: 08/26/2013 °Elsevier Interactive Patient Education ©2016 Elsevier Inc. ° °

## 2016-06-07 NOTE — Telephone Encounter (Signed)
Called and spoke with pts wife and she stated that the pt had the biopsy today.  She stated that he had to go off the lovenox prior to his biopsy and she didn't know when she needed to start him back on the lovenox..  TP is out of the office today so will forward to CY to address.  Please advise. Thanks  No Known Allergies

## 2016-06-07 NOTE — Telephone Encounter (Signed)
Ok thanks, and yeah I though he had already established with a urologist for prostate issues so just let me know!

## 2016-06-07 NOTE — Telephone Encounter (Signed)
Ok to restart lovenox tomorrow. Call office for instructions if they haven't heard from [facility] or Dr Lamonte Sakai in a week after the biopsy.

## 2016-06-07 NOTE — Telephone Encounter (Signed)
Left message on vm to call us back so that we could clarify why he needed urology. I did associate dx on his problem list of chronic kidney disease with referral. ( I assumed from message that they were already established patients there and just needed a referral for upcoming office visit. [place] requires referrals for f/u visits in addition to initial visits) but we will see when she calls for clarification

## 2016-06-07 NOTE — Telephone Encounter (Signed)
My mistake is was thinking urology vs nephrology

## 2016-06-07 NOTE — Procedures (Signed)
R neck LN Bx 18 g times three No comp/EBL

## 2016-06-07 NOTE — Telephone Encounter (Signed)
Patient's wife called back and states Henry Ford is seeing urology for his prostate issues. He is an established patient there but needs referrals for f/u visits per insurance. The urologist did also see him for his kidney disease but main concern is prostate. New referral placed with appropriate diagnosis.

## 2016-06-07 NOTE — H&P (Signed)
Chief Complaint: Patient was seen in consultation today for lymph node biopsy at the request of Parrett,Tammy S  Referring Physician(s): Melvenia Needles  Supervising Physician: Marybelle Killings  Patient Status: Outpatient  History of Present Illness: Henry Ford is a 81 y.o. male being worked up for lung mass with metastatic findings. He is referred for biopsy of a right [place] LN after review of his PET scan. PMHx, meds, labs, imaging, allergies reviewed. Has held Lovenox one full day Has been NPO all day Wife at bedside.  Past Medical History  Diagnosis Date  . Hypertension   . Prostate enlargement   . Peripheral vascular disease ([place]) 12/2015    non-healing ulcer right Hallux. S/P angioplasty and stenting of right common iliac 01/02/2016.  . Right foot pain 12/30/2015    Admitted to [facility] 12/11/2015, discharge 12/14/2015, diagnoses included acute right great toe infection/cellulitis, peripheral vascular disease. ABI demonstrated arterial occlusive disease worse on left. Is referred outpatient to vascular The CT scan documents for copy of his discharge summary. He should follow up with Dr. Ladona Horns, general surgery at [facility]. No dated 02/06/2  . Anemia 12/2015  . Colon polyps ~ 2001    unknown pathology, colonoscopy done at outside hospital.   . Heme positive stool 12/2015  . Gastrointestinal hemorrhage with melena     Status post EGD and blood transfusion March 2017   . Ischemic foot ulcer due to atherosclerosis of native artery of limb ([place]) 01/10/2016  . Cancer ([place])     lung  . Stroke [facility]) May 2017    Past Surgical History  Procedure Laterality Date  . Hydrocele excision  1956  . Peripheral vascular catheterization N/A 01/02/2016    Procedure: Abdominal Aortogram w/Lower Extremity;  Surgeon: Angelia Mould, MD;  Location: [facility];  Service: Cardiovascular;  Laterality: N/A;  . Peripheral vascular catheterization  01/02/2016   Procedure: Peripheral Vascular Intervention;  Surgeon: Angelia Mould, MD;  Location: [facility];  Service: Cardiovascular;;  . Esophagogastroduodenoscopy N/A 01/21/2016    Procedure: ESOPHAGOGASTRODUODENOSCOPY (EGD);  Surgeon: Irene Shipper, MD;  Location: [facility];  Service: Endoscopy;  Laterality: N/A;    Allergies: Review of patient's allergies indicates no known allergies.  Medications: Prior to Admission medications   Medication Sig Start Date End Date Taking? Authorizing Provider  atorvastatin (LIPITOR) 10 MG tablet Take 1 tablet (10 mg total) by mouth daily at 6 PM. 05/21/16  Yes [name], DO  co-enzyme Q-10 30 MG capsule Take 30 mg by mouth daily.   Yes Historical Provider, MD  finasteride (PROSCAR) 5 MG tablet Take 5 mg by mouth daily. Reported on 02/01/2016   Yes Historical Provider, MD  fluconazole (DIFLUCAN) 100 MG tablet Take 1 tablet (100 mg total) by mouth daily. 05/30/16  Yes Volanda Napoleon, MD  FLUoxetine (PROZAC) 20 MG tablet Take 1 tablet (20 mg total) by mouth daily. 05/24/16  Yes [name], DO  Multiple Vitamin (MULTIVITAMIN) tablet Take 1 tablet by mouth daily.   Yes Historical Provider, MD  Omega 3 1000 MG CAPS Take 1,000 mg by mouth daily.   Yes Historical Provider, MD  pantoprazole (PROTONIX) 40 MG tablet Take 40 mg by mouth daily.  04/18/16  Yes Historical Provider, MD  predniSONE (DELTASONE) 20 MG tablet Take 1 tablet (20 mg total) by mouth daily with breakfast. 05/30/16  Yes Volanda Napoleon, MD  Red Yeast Rice 600 MG CAPS Take 600 mg by mouth daily.  Yes Historical Provider, MD  terazosin (HYTRIN) 10 MG capsule Take 10 mg by mouth at bedtime. Reported on 01/20/2016   Yes Historical Provider, MD  vitamin B-12 (CYANOCOBALAMIN) 1000 MCG tablet Take 1,000 mcg by mouth daily.   Yes Historical Provider, MD  vitamin C (ASCORBIC ACID) 500 MG tablet Take 1,000 mg by mouth daily.   Yes Historical Provider, MD  enoxaparin (LOVENOX) 120 MG/0.8ML  injection Inject 0.71 mLs (106 mg total) into the skin every evening. Take daily at 6 pm starting 05/16/2016. 05/16/16   Modena Jansky, MD     Family History  Problem Relation Age of Onset  . Colon cancer Neg Hx   . Diabetes Brother     Social History   Social History  . Marital Status: Married    Spouse Name: Gwyndolyn Saxon  . Number of Children: 2  . Years of Education: N/A   Occupational History  . retired    Social History Main Topics  . Smoking status: Former Smoker -- 0.25 packs/day for 60 years    Types: Cigarettes    Start date: 07/03/1950    Quit date: 01/20/2016  . Smokeless tobacco: Never Used  . Alcohol Use: 6.0 oz/week    10 Cans of beer, 0 Standard drinks or equivalent per week     Comment: beer  . Drug Use: No  . Sexual Activity: Not Asked   Other Topics Concern  . None   Social History Narrative     Review of Systems: A 12 point ROS discussed and pertinent positives are indicated in the HPI above.  All other systems are negative.  Review of Systems  Vital Signs: T: 98.2, HR: 71, RR: 18, BP: 136/73  Physical Exam  Constitutional: He is oriented to person, place, and time. He appears well-developed and well-nourished. No distress.  HENT:  Head: Normocephalic.  Mouth/Throat: Oropharynx is clear and moist.  Neck: Normal range of motion. No tracheal deviation present.  Cardiovascular: Normal rate, regular rhythm and normal heart sounds.   Pulmonary/Chest: Effort normal and breath sounds normal. No respiratory distress. He has no wheezes. He has no rales.  Lymphadenopathy:    He has no cervical adenopathy.  Neurological: He is alert and oriented to person, place, and time.  Psychiatric: He has a normal mood and affect. Judgment normal.    Mallampati Score:  MD Evaluation Airway: WNL Heart: WNL Abdomen: WNL Chest/ Lungs: WNL ASA  Classification: 3 Mallampati/Airway Score: One   Labs:  CBC:  Recent Labs  05/16/16 0638 05/21/16 1501  05/30/16 1514 06/07/16 1055  WBC 12.9* 11.9* 13.2* 18.0*  HGB 8.5* 9.5* 9.3* 9.6*  HCT 25.8* 29.1* 28.8* 29.4*  PLT 299 281 316 276    COAGS:  Recent Labs  01/20/16 1129 05/13/16 2246 05/15/16 0821 06/07/16 1055  INR 1.08 2.12*  --  1.17  APTT  --   --  113* 30    BMP:  Recent Labs  05/13/16 2246 05/15/16 0821 05/21/16 1501 05/30/16 1514  NA 140 139 141 135  K 4.0 4.0 4.7 4.5  CL 112* 108 108 105  CO2 20* 20* 23 22  GLUCOSE 89 101* 91 100*  BUN 21* 16 27* 28*  CALCIUM 8.8* 8.9 9.0 9.0  CREATININE 1.43* 1.38* 1.47* 1.49*  GFRNONAA 45* 47* 45* 44*  GFRAA 53* 55* 52* 51*    LIVER FUNCTION TESTS:  Recent Labs  05/10/16 1301 05/13/16 2246 05/21/16 1501 05/30/16 1514  BILITOT 0.40 0.5 0.4 0.2  AST '22 18 21 18  '$ ALT 12 10* 10 11  ALKPHOS 79 67 74 76  PROT 6.8 6.2* 6.7 6.8  ALBUMIN 2.7* 2.8* 3.6 3.4*    TUMOR MARKERS: No results for input(s): AFPTM, CEA, CA199, CHROMGRNA in the last 8760 hours.  Assessment and Plan: Lung mass Hypermetabolic (R)[place] LN For US guided biopsy of LN Labs ok Risks and Benefits discussed with the patient including, but not limited to bleeding, infection, damage to adjacent structures or low yield requiring additional tests. All of the patient's questions were answered, patient is agreeable to proceed. Consent signed and in chart.    Thank you for this interesting consult.  I greatly enjoyed meeting Larence Thone and look forward to participating in their care.  A copy of this report was sent to the requesting provider on this date.  Electronically Signed: Ascencion Dike 06/07/2016, 12:42 PM   I spent a total of 20 minutes in face to face in clinical consultation, greater than 50% of which was counseling/coordinating care for lymph node biopsy

## 2016-06-08 NOTE — Telephone Encounter (Signed)
Thanks! I signed the referral!

## 2016-06-12 ENCOUNTER — Other Ambulatory Visit: Payer: Self-pay | Admitting: *Deleted

## 2016-06-12 ENCOUNTER — Telehealth: Payer: Self-pay | Admitting: Emergency Medicine

## 2016-06-12 DIAGNOSIS — C3490 Malignant neoplasm of unspecified part of unspecified bronchus or lung: Secondary | ICD-10-CM

## 2016-06-12 NOTE — Telephone Encounter (Signed)
I reviewed the pathology with the patient by phone today. Needle biopsy shows poorly differentiated adenocarcinoma, likely lung primary. I explained to him that we need to get him started on therapy as soon as possible. I will ask our office to get him set up with Dr. Katheran Awe. I will also forward this message to Dr Barnie Mort office.

## 2016-06-13 ENCOUNTER — Other Ambulatory Visit: Payer: Self-pay | Admitting: Hematology & Oncology

## 2016-06-13 ENCOUNTER — Telehealth: Payer: Self-pay | Admitting: Emergency Medicine

## 2016-06-13 ENCOUNTER — Ambulatory Visit (HOSPITAL_BASED_OUTPATIENT_CLINIC_OR_DEPARTMENT_OTHER): Payer: Commercial Managed Care - HMO | Admitting: Family

## 2016-06-13 ENCOUNTER — Other Ambulatory Visit (HOSPITAL_BASED_OUTPATIENT_CLINIC_OR_DEPARTMENT_OTHER): Payer: Commercial Managed Care - HMO

## 2016-06-13 ENCOUNTER — Encounter: Payer: Self-pay | Admitting: Family

## 2016-06-13 VITALS — BP 141/49 | HR 61 | Temp 98.1°F | Resp 18 | Ht 67.5 in | Wt 138.0 lb

## 2016-06-13 DIAGNOSIS — C779 Secondary and unspecified malignant neoplasm of lymph node, unspecified: Secondary | ICD-10-CM | POA: Diagnosis not present

## 2016-06-13 DIAGNOSIS — Z86711 Personal history of pulmonary embolism: Secondary | ICD-10-CM

## 2016-06-13 DIAGNOSIS — C3411 Malignant neoplasm of upper lobe, right bronchus or lung: Secondary | ICD-10-CM | POA: Diagnosis not present

## 2016-06-13 DIAGNOSIS — G4701 Insomnia due to medical condition: Secondary | ICD-10-CM | POA: Diagnosis not present

## 2016-06-13 DIAGNOSIS — D473 Essential (hemorrhagic) thrombocythemia: Secondary | ICD-10-CM | POA: Diagnosis not present

## 2016-06-13 DIAGNOSIS — D75839 Thrombocytosis, unspecified: Secondary | ICD-10-CM

## 2016-06-13 DIAGNOSIS — Z7901 Long term (current) use of anticoagulants: Secondary | ICD-10-CM

## 2016-06-13 DIAGNOSIS — Z86718 Personal history of other venous thrombosis and embolism: Secondary | ICD-10-CM

## 2016-06-13 DIAGNOSIS — C3490 Malignant neoplasm of unspecified part of unspecified bronchus or lung: Secondary | ICD-10-CM

## 2016-06-13 DIAGNOSIS — C3491 Malignant neoplasm of unspecified part of right bronchus or lung: Secondary | ICD-10-CM

## 2016-06-13 DIAGNOSIS — K921 Melena: Secondary | ICD-10-CM

## 2016-06-13 HISTORY — DX: Malignant neoplasm of unspecified part of unspecified bronchus or lung: C34.90

## 2016-06-13 LAB — COMPREHENSIVE METABOLIC PANEL
ALBUMIN: 2.9 g/dL — AB (ref 3.5–5.0)
ALK PHOS: 74 U/L (ref 40–150)
ALT: 11 U/L (ref 0–55)
AST: 14 U/L (ref 5–34)
Anion Gap: 9 mEq/L (ref 3–11)
BILIRUBIN TOTAL: 0.33 mg/dL (ref 0.20–1.20)
BUN: 30.2 mg/dL — ABNORMAL HIGH (ref 7.0–26.0)
CO2: 24 mEq/L (ref 22–29)
CREATININE: 1.4 mg/dL — AB (ref 0.7–1.3)
Calcium: 8.6 mg/dL (ref 8.4–10.4)
Chloride: 106 mEq/L (ref 98–109)
EGFR: 47 mL/min/{1.73_m2} — AB (ref >90)
GLUCOSE: 98 mg/dL (ref 70–140)
Potassium: 4.7 mEq/L (ref 3.5–5.1)
SODIUM: 139 meq/L (ref 136–145)
TOTAL PROTEIN: 6.3 g/dL — AB (ref 6.4–8.3)

## 2016-06-13 LAB — CBC WITH DIFFERENTIAL (CANCER CENTER ONLY)
BASO#: 0 10*3/uL (ref 0.0–0.2)
BASO%: 0.1 % (ref 0.0–2.0)
EOS ABS: 0 10*3/uL (ref 0.0–0.5)
EOS%: 0.1 % (ref 0.0–7.0)
HCT: 28.5 % — ABNORMAL LOW (ref 38.7–49.9)
HEMOGLOBIN: 9.2 g/dL — AB (ref 13.0–17.1)
LYMPH#: 1 10*3/uL (ref 0.9–3.3)
LYMPH%: 6.7 % — ABNORMAL LOW (ref 14.0–48.0)
MCH: 30.3 pg (ref 28.0–33.4)
MCHC: 32.3 g/dL (ref 32.0–35.9)
MCV: 94 fL (ref 82–98)
MONO#: 0.5 10*3/uL (ref 0.1–0.9)
MONO%: 3.7 % (ref 0.0–13.0)
NEUT%: 89.4 % — ABNORMAL HIGH (ref 40.0–80.0)
NEUTROS ABS: 12.8 10*3/uL — AB (ref 1.5–6.5)
Platelets: 231 10*3/uL (ref 145–400)
RBC: 3.04 10*6/uL — AB (ref 4.20–5.70)
RDW: 16.1 % — ABNORMAL HIGH (ref 11.1–15.7)
WBC: 14.3 10*3/uL — AB (ref 4.0–10.0)

## 2016-06-13 MED ORDER — TRAZODONE HCL 50 MG PO TABS: 50.0000 mg | ORAL_TABLET | Freq: Every day | ORAL | 2 refills | Status: AC

## 2016-06-13 NOTE — Telephone Encounter (Signed)
Attempted to return pt's wife's call. Line was busy, will try her again. The discussion with his husband was to give him biopsy results > showed poorly differentiated adenoCA. I have referred him back to see his oncologist Dr Katheran Awe.

## 2016-06-13 NOTE — Progress Notes (Signed)
Hematology and Oncology Follow Up Visit  Henry Ford [id number] March 03, 1935 81 y.o. 06/13/2016   Principle Diagnosis:  Metastatic bronchogenic carcinoma - poorly differentiated adenocarcinoma RIND Pulmonary embolism/RIGHT leg DVT Hypercoagulable due to malignancy   Current Therapy:   Lovenox '120mg'$  sq q day Alimta q 21 days - to start next week    Interim History: Henry Ford is here today with Henry Ford for follow-up and to discuss a treatment plan. He is having some fatigue and SOB with and without exertion. He is not sleeping well at night. He is sleeping on an incline but still not comfortable.  O2 sat dropped to 88% on ambulation in hallways. We will order home O2 for at night.  No fever, chills, n/v, cough, rash, dizziness, chest pain, palpitations, abdominal pain or changes in bowel or bladder habits.  The swelling in Henry left extremity is improved. He still has some +2 pitting around the ankle. No numbness or tingling in Henry extremities. No episodes of bleeding or bruising since starting Lovenox.  Henry Ford states that he is eating better now than at Henry last appointment. Despite this Henry weight is down 3 lbs. He is drinking fluids and trying to stay hydrated.  He became quite emotional when talking about outcomes with and without treatment. He is having a hard time coming with this. He states that he want to "live to be 89." He has no thoughts of harming himself or others.     Medications:    Medication List       Accurate as of 06/13/16  1:56 PM. Always use your most recent med list.          atorvastatin 10 MG tablet Commonly known as:  LIPITOR Take 1 tablet (10 mg total) by mouth daily at 6 PM.   co-enzyme Q-10 30 MG capsule Take 30 mg by mouth daily.   enoxaparin 120 MG/0.8ML injection Commonly known as:  LOVENOX Inject 0.71 mLs (106 mg total) into the skin every evening. Take daily at 6 pm starting 05/16/2016.   finasteride 5 MG tablet Commonly known as:   PROSCAR Take 5 mg by mouth daily. Reported on 02/01/2016   fluconazole 100 MG tablet Commonly known as:  DIFLUCAN Take 1 tablet (100 mg total) by mouth daily.   FLUoxetine 20 MG tablet Commonly known as:  PROZAC Take 1 tablet (20 mg total) by mouth daily.   multivitamin tablet Take 1 tablet by mouth daily.   Omega 3 1000 MG Caps Take 1,000 mg by mouth daily.   pantoprazole 40 MG tablet Commonly known as:  PROTONIX Take 40 mg by mouth daily.   predniSONE 20 MG tablet Commonly known as:  DELTASONE Take 1 tablet (20 mg total) by mouth daily with breakfast.   Red Yeast Rice 600 MG Caps Take 600 mg by mouth daily.   terazosin 10 MG capsule Commonly known as:  HYTRIN Take 10 mg by mouth at bedtime. Reported on 01/20/2016   vitamin B-12 1000 MCG tablet Commonly known as:  CYANOCOBALAMIN Take 1,000 mcg by mouth daily.   vitamin C 500 MG tablet Commonly known as:  ASCORBIC ACID Take 1,000 mg by mouth daily.       Allergies: No Known Allergies  Past Medical History, Surgical history, Social history, and Family History were reviewed and updated.  Review of Systems: All other 10 point review of systems is negative.   Physical Exam:  vitals were not taken for this visit.  Wt Readings from  Last 3 Encounters:  06/07/16 137 lb 12.8 oz (62.5 kg)  06/04/16 139 lb (63 kg)  05/30/16 141 lb (64 kg)    Ocular: Sclerae unicteric, pupils equal, round and reactive to light Ear-nose-throat: Oropharynx clear, dentition fair Lymphatic: No cervical supraclavicular or axillary adenopathy Lungs no rales or rhonchi, good excursion bilaterally Heart regular rate and rhythm, no murmur appreciated Abd soft, nontender, positive bowel sounds, no liver or spleen tip palpated on exam MSK no focal spinal tenderness, no joint edema Neuro: non-focal, well-oriented, appropriate affect Breasts: Deferred  Lab Results  Component Value Date   WBC 14.3 (H) 06/13/2016   HGB 9.2 (L) 06/13/2016    HCT 28.5 (L) 06/13/2016   MCV 94 06/13/2016   PLT 231 06/13/2016   Lab Results  Component Value Date   FERRITIN 393 (H) 04/05/2016   IRON 29 (L) 04/05/2016   TIBC 210 (L) 04/05/2016   UIBC 181 04/05/2016   IRONPCTSAT 14 (L) 04/05/2016   Lab Results  Component Value Date   RETICCTPCT 1.2 01/11/2016   RBC 3.04 (L) 06/13/2016   RETICCTABS 35.6 01/11/2016   No results found for: KPAFRELGTCHN, LAMBDASER, KAPLAMBRATIO No results found for: IGGSERUM, IGA, IGMSERUM No results found for: Kathrynn Ducking, MSPIKE, SPEI   Chemistry      Component Value Date/Time   NA 135 05/30/2016 1514   NA 143 05/10/2016 1301   K 4.5 05/30/2016 1514   K 4.3 05/10/2016 1301   CL 105 05/30/2016 1514   CO2 22 05/30/2016 1514   CO2 19 (L) 05/10/2016 1301   BUN 28 (H) 05/30/2016 1514   BUN 26.4 (H) 05/10/2016 1301   CREATININE 1.49 (H) 05/30/2016 1514   CREATININE 1.47 (H) 05/21/2016 1501   CREATININE 1.3 05/10/2016 1301      Component Value Date/Time   CALCIUM 9.0 05/30/2016 1514   CALCIUM 8.5 05/10/2016 1301   ALKPHOS 76 05/30/2016 1514   ALKPHOS 79 05/10/2016 1301   AST 18 05/30/2016 1514   AST 22 05/10/2016 1301   ALT 11 05/30/2016 1514   ALT 12 05/10/2016 1301   BILITOT 0.2 05/30/2016 1514   BILITOT 0.40 05/10/2016 1301      Impression and Plan: Henry Ford is a 81 yo white male with metastatic poorly differentiated adenocarcinoma of the lungs. He has extensive lymph node involvement. He is feeling fatigued and having SOB at night which is keeping him from sleeping well.  He became quite emotional while talking about Henry treatment plan but did state that he wishes to be treated. He understands that we can treat him but with metastatic disease unfortunately we can not cure him.  We will begin treatment next week with Alimta. Hopefully he will be able to tolerate this.  We will make sure he gets a new treatment and appointment schedule today.  Home  O2 for at night was ordered. Henry Lovenox was also refilled.  Both he and Henry Ford know to contact our office with any questions or concerns. We can certainly see him sooner if need be.   Henry Bottom, NP 7/26/20171:56 PM    ADDENDUM:  I saw and examined the patient with Henry Ford. This is a tough situation. He is 81 years old. Henry performance status is ECOG 2 at best.  We did get the pathology back. The pathology report ([id number]) shows a poorly differentiated adenocarcinoma.  He had a PET scan done. This showed a dominant 3.2 cm mass in the right upper  lobe. Has multiple bilateral nodules. Has a lateral hilar and mediastinal lymph nodes. He has right supraclavicular lymphadenopathy.  He has stage IV disease. He is not curable.  We talked to he and Henry Ford are about 45 minutes. I told him that I thought that if he did not take treatment or if treatment does not work, he would not make it more than 2 months. If treatment did work, and the chance of treatment working probably is less than 25%, we might get 6-8 months extra.  He is on Lovenox. He is hypercoagulable, likely from Henry malignancy.  I think that Henry weight clearly will show [place] how he is doing.  I think the one way we could treat him would be to give him single agent Alimta. I don't think he would be a candidate for Avastin given Henry vascular issues.  I did call pathology and I did have them send off the tumor for the typical genetic drivers. I would think the chance of these being positive would be less than 10%.  He is incredibly anxious over having Henry malignancy. He and I talked about this. I really do not approach CODE status with him. I did this might have been too much for him to handle.  After much discussion, he will try treatment. I think single agent Alimta would be reasonable.  We will get started next week. I don't think he needs to have a Port-A-Cath placed.

## 2016-06-13 NOTE — Telephone Encounter (Signed)
Routing to RB to contact pt's wife regarding results.

## 2016-06-14 ENCOUNTER — Other Ambulatory Visit: Payer: Self-pay | Admitting: Family

## 2016-06-14 DIAGNOSIS — C3491 Malignant neoplasm of unspecified part of right bronchus or lung: Secondary | ICD-10-CM

## 2016-06-14 DIAGNOSIS — I2699 Other pulmonary embolism without acute cor pulmonale: Secondary | ICD-10-CM

## 2016-06-14 DIAGNOSIS — I82413 Acute embolism and thrombosis of femoral vein, bilateral: Secondary | ICD-10-CM

## 2016-06-14 LAB — IRON AND TIBC
%SAT: 13 % — AB (ref 20–55)
IRON: 23 ug/dL — AB (ref 42–163)
TIBC: 174 ug/dL — AB (ref 202–409)
UIBC: 151 ug/dL (ref 117–376)

## 2016-06-14 LAB — PREALBUMIN: PREALBUMIN: 19 mg/dL (ref 9–32)

## 2016-06-14 LAB — FERRITIN: Ferritin: 1150 ng/mL — ABNORMAL HIGH (ref 22–316)

## 2016-06-14 MED ORDER — ENOXAPARIN SODIUM 120 MG/0.8ML ~~LOC~~ SOLN: 106.0000 mg | SUBCUTANEOUS | 3 refills | Status: DC

## 2016-06-15 ENCOUNTER — Other Ambulatory Visit: Payer: Self-pay | Admitting: *Deleted

## 2016-06-15 DIAGNOSIS — I739 Peripheral vascular disease, unspecified: Secondary | ICD-10-CM

## 2016-06-18 ENCOUNTER — Ambulatory Visit: Payer: Commercial Managed Care - HMO | Admitting: Hematology & Oncology

## 2016-06-18 ENCOUNTER — Ambulatory Visit: Payer: Commercial Managed Care - HMO

## 2016-06-18 ENCOUNTER — Inpatient Hospital Stay: Payer: Commercial Managed Care - HMO | Admitting: Osteopathic Medicine

## 2016-06-18 ENCOUNTER — Other Ambulatory Visit: Payer: Commercial Managed Care - HMO

## 2016-06-19 ENCOUNTER — Ambulatory Visit: Payer: Commercial Managed Care - HMO | Admitting: Osteopathic Medicine

## 2016-06-19 ENCOUNTER — Encounter (HOSPITAL_COMMUNITY): Payer: Self-pay

## 2016-06-20 ENCOUNTER — Other Ambulatory Visit (HOSPITAL_BASED_OUTPATIENT_CLINIC_OR_DEPARTMENT_OTHER): Payer: Commercial Managed Care - HMO

## 2016-06-20 ENCOUNTER — Ambulatory Visit (HOSPITAL_BASED_OUTPATIENT_CLINIC_OR_DEPARTMENT_OTHER): Payer: Commercial Managed Care - HMO

## 2016-06-20 ENCOUNTER — Other Ambulatory Visit: Payer: Self-pay | Admitting: Family

## 2016-06-20 VITALS — BP 160/45 | HR 60 | Temp 98.0°F | Resp 18

## 2016-06-20 DIAGNOSIS — Z5111 Encounter for antineoplastic chemotherapy: Secondary | ICD-10-CM

## 2016-06-20 DIAGNOSIS — C3411 Malignant neoplasm of upper lobe, right bronchus or lung: Secondary | ICD-10-CM

## 2016-06-20 DIAGNOSIS — C779 Secondary and unspecified malignant neoplasm of lymph node, unspecified: Secondary | ICD-10-CM

## 2016-06-20 DIAGNOSIS — D509 Iron deficiency anemia, unspecified: Secondary | ICD-10-CM | POA: Diagnosis not present

## 2016-06-20 DIAGNOSIS — C3491 Malignant neoplasm of unspecified part of right bronchus or lung: Secondary | ICD-10-CM

## 2016-06-20 DIAGNOSIS — K921 Melena: Secondary | ICD-10-CM

## 2016-06-20 DIAGNOSIS — D75839 Thrombocytosis, unspecified: Secondary | ICD-10-CM

## 2016-06-20 DIAGNOSIS — D473 Essential (hemorrhagic) thrombocythemia: Secondary | ICD-10-CM

## 2016-06-20 DIAGNOSIS — G4701 Insomnia due to medical condition: Secondary | ICD-10-CM

## 2016-06-20 LAB — CBC WITH DIFFERENTIAL (CANCER CENTER ONLY)
BASO#: 0 10*3/uL (ref 0.0–0.2)
BASO%: 0.1 % (ref 0.0–2.0)
EOS ABS: 0 10*3/uL (ref 0.0–0.5)
EOS%: 0.1 % (ref 0.0–7.0)
HCT: 29 % — ABNORMAL LOW (ref 38.7–49.9)
HGB: 9.2 g/dL — ABNORMAL LOW (ref 13.0–17.1)
LYMPH#: 0.9 10*3/uL (ref 0.9–3.3)
LYMPH%: 5.7 % — AB (ref 14.0–48.0)
MCH: 30 pg (ref 28.0–33.4)
MCHC: 31.7 g/dL — AB (ref 32.0–35.9)
MCV: 95 fL (ref 82–98)
MONO#: 0.7 10*3/uL (ref 0.1–0.9)
MONO%: 4.6 % (ref 0.0–13.0)
NEUT#: 13.4 10*3/uL — ABNORMAL HIGH (ref 1.5–6.5)
NEUT%: 89.5 % — AB (ref 40.0–80.0)
Platelets: 162 10*3/uL (ref 145–400)
RBC: 3.07 10*6/uL — ABNORMAL LOW (ref 4.20–5.70)
RDW: 16.9 % — AB (ref 11.1–15.7)
WBC: 15 10*3/uL — ABNORMAL HIGH (ref 4.0–10.0)

## 2016-06-20 LAB — CMP (CANCER CENTER ONLY)
ALT(SGPT): 21 U/L (ref 10–47)
AST: 23 U/L (ref 11–38)
Albumin: 2.6 g/dL — ABNORMAL LOW (ref 3.3–5.5)
Alkaline Phosphatase: 62 U/L (ref 26–84)
BILIRUBIN TOTAL: 0.5 mg/dL (ref 0.20–1.60)
BUN, Bld: 35 mg/dL — ABNORMAL HIGH (ref 7–22)
CHLORIDE: 106 meq/L (ref 98–108)
CO2: 29 meq/L (ref 18–33)
CREATININE: 1 mg/dL (ref 0.6–1.2)
Calcium: 8.5 mg/dL (ref 8.0–10.3)
GLUCOSE: 142 mg/dL — AB (ref 73–118)
Potassium: 4.7 mEq/L (ref 3.3–4.7)
SODIUM: 135 meq/L (ref 128–145)
Total Protein: 5.9 g/dL — ABNORMAL LOW (ref 6.4–8.1)

## 2016-06-20 LAB — IRON AND TIBC
%SAT: 18 % — AB (ref 20–55)
IRON: 32 ug/dL — AB (ref 42–163)
TIBC: 173 ug/dL — AB (ref 202–409)
UIBC: 141 ug/dL (ref 117–376)

## 2016-06-20 LAB — FERRITIN: Ferritin: 1037 ng/ml — ABNORMAL HIGH (ref 22–316)

## 2016-06-20 MED ORDER — FOLIC ACID 1 MG PO TABS: 1.0000 mg | ORAL_TABLET | Freq: Every day | ORAL | 3 refills | Status: DC

## 2016-06-20 MED ORDER — PROCHLORPERAZINE MALEATE 10 MG PO TABS
ORAL_TABLET | ORAL | Status: AC
  Filled 2016-06-20: qty 1

## 2016-06-20 MED ORDER — SODIUM CHLORIDE 0.9 % IV SOLN
375.0000 mg/m2 | Freq: Once | INTRAVENOUS | Status: AC
  Administered 2016-06-20: 650 mg via INTRAVENOUS
  Filled 2016-06-20: qty 20

## 2016-06-20 MED ORDER — CYANOCOBALAMIN 1000 MCG/ML IJ SOLN
1000.0000 ug | Freq: Once | INTRAMUSCULAR | Status: AC
  Administered 2016-06-20: 1000 ug via INTRAMUSCULAR

## 2016-06-20 MED ORDER — CYANOCOBALAMIN 1000 MCG/ML IJ SOLN
INTRAMUSCULAR | Status: AC
  Filled 2016-06-20: qty 1

## 2016-06-20 MED ORDER — SODIUM CHLORIDE 0.9 % IV SOLN
510.0000 mg | Freq: Once | INTRAVENOUS | Status: AC
  Administered 2016-06-20: 510 mg via INTRAVENOUS
  Filled 2016-06-20: qty 17

## 2016-06-20 MED ORDER — SODIUM CHLORIDE 0.9 % IV SOLN
Freq: Once | INTRAVENOUS | Status: AC
  Administered 2016-06-20: 14:00:00 via INTRAVENOUS

## 2016-06-20 MED ORDER — ONDANSETRON HCL 8 MG PO TABS: 8.0000 mg | ORAL_TABLET | Freq: Two times a day (BID) | ORAL | 1 refills | Status: DC | PRN

## 2016-06-20 MED ORDER — PROCHLORPERAZINE MALEATE 10 MG PO TABS
10.0000 mg | ORAL_TABLET | Freq: Once | ORAL | Status: AC
  Administered 2016-06-20: 10 mg via ORAL

## 2016-06-20 NOTE — Patient Instructions (Signed)
[facility] Discharge Instructions for Patients Receiving Chemotherapy  Today you received the following chemotherapy agents Alimta  To help prevent nausea and vomiting after your treatment, we encourage you to take your nausea medication as prescribed  1. Ondansetron (ZOFRAN)                                                 Take 1 tablet (8 mg total) by mouth 2 (two) times daily as needed (Nausea or vomiting).    If you develop nausea and vomiting that is not controlled by your nausea medication, call the clinic. If it is after clinic hours your family physician or the after hours number for the clinic or go to the Emergency Department.   BELOW ARE SYMPTOMS THAT SHOULD BE REPORTED IMMEDIATELY:  *FEVER GREATER THAN 100.5 F  *CHILLS WITH OR WITHOUT FEVER  NAUSEA AND VOMITING THAT IS NOT CONTROLLED WITH YOUR NAUSEA MEDICATION  *UNUSUAL SHORTNESS OF BREATH  *UNUSUAL BRUISING OR BLEEDING  TENDERNESS IN MOUTH AND THROAT WITH OR WITHOUT PRESENCE OF ULCERS  *URINARY PROBLEMS  *BOWEL PROBLEMS  UNUSUAL RASH Items with * indicate a potential emergency and should be followed up as soon as possible.  One of the nurses will contact you 24 hours after your treatment. Please let the nurse know about any problems that you may have experienced. Feel free to call the clinic you have any questions or concerns. The clinic phone number is [phone].   I have been informed and understand all the instructions given to me. I know to contact the clinic, my physician, or go to the Emergency Department if any problems should occur. I do not have any questions at this time, but understand that I may call the clinic during office hours   should I have any questions or need assistance in obtaining follow up care.    __________________________________________  _____________  __________ Signature of Patient or Authorized Representative            Date                    Time    __________________________________________ Nurse's Signature   Pemetrexed injection What is this medicine? PEMETREXED (PEM e TREX ed) is a chemotherapy drug. This medicine affects cells that are rapidly growing, such as cancer cells and cells in your mouth and stomach. It is usually used to treat lung cancers like non-small cell lung cancer and mesothelioma. It may also be used to treat other cancers. This medicine may be used for other purposes; ask your health care provider or pharmacist if you have questions. What should I tell my health care provider before I take this medicine? They need to know if you have any of these conditions: -if you frequently drink alcohol containing beverages -infection (especially a virus infection such as chickenpox, cold sores, or herpes) -kidney disease -liver disease -low blood counts, like low platelets, red bloods, or white blood cells -an unusual or allergic reaction to pemetrexed, mannitol, other medicines, foods, dyes, or preservatives -pregnant or trying to get pregnant -breast-feeding How should I use this medicine? This drug is given as an infusion into a vein. It is administered in a hospital or clinic by a specially trained health care professional. Talk to your pediatrician regarding the use of this medicine in children. Special  care may be needed. Overdosage: If you think you have taken too much of this medicine contact a poison control center or emergency room at once. NOTE: This medicine is only for you. Do not share this medicine with others. What if I miss a dose? It is important not to miss your dose. Call your doctor or health care professional if you are unable to keep an appointment. What may interact with this medicine? -aspirin and aspirin-like medicines -medicines to increase blood counts like filgrastim, pegfilgrastim, sargramostim -methotrexate -NSAIDS, medicines for pain and inflammation, like ibuprofen or  naproxen -probenecid -pyrimethamine -vaccines Talk to your doctor or health care professional before taking any of these medicines: -acetaminophen -aspirin -ibuprofen -ketoprofen -naproxen This list may not describe all possible interactions. Give your health care provider a list of all the medicines, herbs, non-prescription drugs, or dietary supplements you use. Also tell them if you smoke, drink alcohol, or use illegal drugs. Some items may interact with your medicine. What should I watch for while using this medicine? Visit your doctor for checks on your progress. This drug may make you feel generally unwell. This is not uncommon, as chemotherapy can affect healthy cells as well as cancer cells. Report any side effects. Continue your course of treatment even though you feel ill unless your doctor tells you to stop. In some cases, you may be given additional medicines to help with side effects. Follow all directions for their use. Call your doctor or health care professional for advice if you get a fever, chills or sore throat, or other symptoms of a cold or flu. Do not treat yourself. This drug decreases your body's ability to fight infections. Try to avoid being around people who are sick. This medicine may increase your risk to bruise or bleed. Call your doctor or health care professional if you notice any unusual bleeding. Be careful brushing and flossing your teeth or using a toothpick because you may get an infection or bleed more easily. If you have any dental work done, tell your dentist you are receiving this medicine. Avoid taking products that contain aspirin, acetaminophen, ibuprofen, naproxen, or ketoprofen unless instructed by your doctor. These medicines may hide a fever. Call your doctor or health care professional if you get diarrhea or mouth sores. Do not treat yourself. To protect your kidneys, drink water or other fluids as directed while you are taking this medicine. Men  and women must use effective birth control while taking this medicine. You may also need to continue using effective birth control for a time after stopping this medicine. Do not become pregnant while taking this medicine. Tell your doctor right away if you think that you or your partner might be pregnant. There is a potential for serious side effects to an unborn child. Talk to your health care professional or pharmacist for more information. Do not breast-feed an infant while taking this medicine. This medicine may lower sperm counts. What side effects may I notice from receiving this medicine? Side effects that you should report to your doctor or health care professional as soon as possible: -allergic reactions like skin rash, itching or hives, swelling of the face, lips, or tongue -low blood counts - this medicine may decrease the number of white blood cells, red blood cells and platelets. You may be at increased risk for infections and bleeding. -signs of infection - fever or chills, cough, sore throat, pain or difficulty passing urine -signs of decreased platelets or bleeding - bruising, pinpoint  red spots on the skin, black, tarry stools, blood in the urine -signs of decreased red blood cells - unusually weak or tired, fainting spells, lightheadedness -breathing problems, like a dry cough -changes in emotions or moods -chest pain -confusion -diarrhea -high blood pressure -mouth or throat sores or ulcers -pain, swelling, warmth in the leg -pain on swallowing -swelling of the ankles, feet, hands -trouble passing urine or change in the amount of urine -vomiting -yellowing of the eyes or skin Side effects that usually do not require medical attention (report to your doctor or health care professional if they continue or are bothersome): -hair loss -loss of appetite -nausea -stomach upset This list may not describe all possible side effects. Call your doctor for medical advice about side  effects. You may report side effects to FDA at 1-800-FDA-1088. Where should I keep my medicine? This drug is given in a hospital or clinic and will not be stored at home. NOTE: This sheet is a summary. It may not cover all possible information. If you have questions about this medicine, talk to your doctor, pharmacist, or health care provider.    2016, [employer]. (2008-06-08 13:24:03)

## 2016-06-21 ENCOUNTER — Telehealth: Payer: Self-pay | Admitting: Nurse Practitioner

## 2016-06-21 LAB — PREALBUMIN: PREALBUMIN: 18 mg/dL (ref 9–32)

## 2016-06-21 NOTE — Telephone Encounter (Signed)
Spoke with patient and his wife. States he is doing fine. No concerns with fever or pain. He is eating and drinking without difficulty. Verbalized that they have appropriate anti-nausea medications and are familiar with when to contact the office with concerns.

## 2016-06-22 ENCOUNTER — Telehealth: Payer: Self-pay | Admitting: Osteopathic Medicine

## 2016-06-22 MED ORDER — CIPROFLOXACIN HCL 500 MG PO TABS: 500.0000 mg | ORAL_TABLET | Freq: Two times a day (BID) | ORAL | 0 refills | Status: DC

## 2016-06-22 NOTE — Telephone Encounter (Signed)
Spoke to patient  Spouse gave her results as noted below. Letasha Kershaw,CMA

## 2016-06-22 NOTE — Telephone Encounter (Signed)
Please call patient: I received abnormal urine tests, believe this was collected by home health. Tests are concerning for possible urinary tract infection as well as some blood in the urine. I went ahead and called in an antibiotic for him, urine culture will take a few days to come back, we'll call once I have results available and let them know if we need to change antibiotics based on these results.

## 2016-06-25 ENCOUNTER — Encounter (HOSPITAL_COMMUNITY): Payer: Self-pay

## 2016-06-26 ENCOUNTER — Telehealth: Payer: Self-pay

## 2016-06-26 ENCOUNTER — Emergency Department (HOSPITAL_COMMUNITY): Payer: Commercial Managed Care - HMO

## 2016-06-26 ENCOUNTER — Inpatient Hospital Stay (HOSPITAL_COMMUNITY)
Admission: EM | Admit: 2016-06-26 | Discharge: 2016-06-29 | DRG: 064 | Disposition: A | Discharge status: Hospice | Payer: Commercial Managed Care - HMO | Attending: Internal Medicine | Admitting: Internal Medicine

## 2016-06-26 ENCOUNTER — Other Ambulatory Visit: Payer: Self-pay | Admitting: Osteopathic Medicine

## 2016-06-26 ENCOUNTER — Other Ambulatory Visit (HOSPITAL_COMMUNITY): Payer: Self-pay

## 2016-06-26 ENCOUNTER — Encounter (HOSPITAL_COMMUNITY): Payer: Self-pay | Admitting: Nurse Practitioner

## 2016-06-26 ENCOUNTER — Other Ambulatory Visit: Payer: Self-pay

## 2016-06-26 DIAGNOSIS — I2782 Chronic pulmonary embolism: Secondary | ICD-10-CM

## 2016-06-26 DIAGNOSIS — G9340 Encephalopathy, unspecified: Secondary | ICD-10-CM | POA: Diagnosis present

## 2016-06-26 DIAGNOSIS — I2699 Other pulmonary embolism without acute cor pulmonale: Secondary | ICD-10-CM | POA: Diagnosis present

## 2016-06-26 DIAGNOSIS — Z86718 Personal history of other venous thrombosis and embolism: Secondary | ICD-10-CM

## 2016-06-26 DIAGNOSIS — I63431 Cerebral infarction due to embolism of right posterior cerebral artery: Principal | ICD-10-CM | POA: Diagnosis present

## 2016-06-26 DIAGNOSIS — E46 Unspecified protein-calorie malnutrition: Secondary | ICD-10-CM | POA: Diagnosis present

## 2016-06-26 DIAGNOSIS — E785 Hyperlipidemia, unspecified: Secondary | ICD-10-CM | POA: Diagnosis present

## 2016-06-26 DIAGNOSIS — R410 Disorientation, unspecified: Secondary | ICD-10-CM

## 2016-06-26 DIAGNOSIS — R7989 Other specified abnormal findings of blood chemistry: Secondary | ICD-10-CM | POA: Diagnosis present

## 2016-06-26 DIAGNOSIS — I082 Rheumatic disorders of both aortic and tricuspid valves: Secondary | ICD-10-CM | POA: Diagnosis present

## 2016-06-26 DIAGNOSIS — Z87891 Personal history of nicotine dependence: Secondary | ICD-10-CM

## 2016-06-26 DIAGNOSIS — R451 Restlessness and agitation: Secondary | ICD-10-CM | POA: Diagnosis present

## 2016-06-26 DIAGNOSIS — D6959 Other secondary thrombocytopenia: Secondary | ICD-10-CM | POA: Diagnosis present

## 2016-06-26 DIAGNOSIS — Z833 Family history of diabetes mellitus: Secondary | ICD-10-CM

## 2016-06-26 DIAGNOSIS — I6350 Cerebral infarction due to unspecified occlusion or stenosis of unspecified cerebral artery: Secondary | ICD-10-CM

## 2016-06-26 DIAGNOSIS — I214 Non-ST elevation (NSTEMI) myocardial infarction: Secondary | ICD-10-CM | POA: Diagnosis present

## 2016-06-26 DIAGNOSIS — Z8673 Personal history of transient ischemic attack (TIA), and cerebral infarction without residual deficits: Secondary | ICD-10-CM

## 2016-06-26 DIAGNOSIS — E875 Hyperkalemia: Secondary | ICD-10-CM | POA: Diagnosis present

## 2016-06-26 DIAGNOSIS — Z79899 Other long term (current) drug therapy: Secondary | ICD-10-CM

## 2016-06-26 DIAGNOSIS — Z886 Allergy status to analgesic agent status: Secondary | ICD-10-CM

## 2016-06-26 DIAGNOSIS — I82409 Acute embolism and thrombosis of unspecified deep veins of unspecified lower extremity: Secondary | ICD-10-CM | POA: Diagnosis present

## 2016-06-26 DIAGNOSIS — Z66 Do not resuscitate: Secondary | ICD-10-CM | POA: Diagnosis present

## 2016-06-26 DIAGNOSIS — N179 Acute kidney failure, unspecified: Secondary | ICD-10-CM | POA: Diagnosis not present

## 2016-06-26 DIAGNOSIS — D696 Thrombocytopenia, unspecified: Secondary | ICD-10-CM | POA: Diagnosis present

## 2016-06-26 DIAGNOSIS — D473 Essential (hemorrhagic) thrombocythemia: Secondary | ICD-10-CM

## 2016-06-26 DIAGNOSIS — N183 Chronic kidney disease, stage 3 unspecified: Secondary | ICD-10-CM | POA: Diagnosis present

## 2016-06-26 DIAGNOSIS — N400 Enlarged prostate without lower urinary tract symptoms: Secondary | ICD-10-CM | POA: Diagnosis present

## 2016-06-26 DIAGNOSIS — T451X5A Adverse effect of antineoplastic and immunosuppressive drugs, initial encounter: Secondary | ICD-10-CM | POA: Diagnosis present

## 2016-06-26 DIAGNOSIS — D509 Iron deficiency anemia, unspecified: Secondary | ICD-10-CM | POA: Diagnosis present

## 2016-06-26 DIAGNOSIS — I82403 Acute embolism and thrombosis of unspecified deep veins of lower extremity, bilateral: Secondary | ICD-10-CM

## 2016-06-26 DIAGNOSIS — I639 Cerebral infarction, unspecified: Secondary | ICD-10-CM | POA: Diagnosis present

## 2016-06-26 DIAGNOSIS — C3490 Malignant neoplasm of unspecified part of unspecified bronchus or lung: Secondary | ICD-10-CM | POA: Diagnosis present

## 2016-06-26 DIAGNOSIS — I129 Hypertensive chronic kidney disease with stage 1 through stage 4 chronic kidney disease, or unspecified chronic kidney disease: Secondary | ICD-10-CM | POA: Diagnosis present

## 2016-06-26 DIAGNOSIS — R778 Other specified abnormalities of plasma proteins: Secondary | ICD-10-CM

## 2016-06-26 DIAGNOSIS — K219 Gastro-esophageal reflux disease without esophagitis: Secondary | ICD-10-CM | POA: Diagnosis present

## 2016-06-26 DIAGNOSIS — I6320 Cerebral infarction due to unspecified occlusion or stenosis of unspecified precerebral arteries: Secondary | ICD-10-CM | POA: Diagnosis not present

## 2016-06-26 DIAGNOSIS — Z515 Encounter for palliative care: Secondary | ICD-10-CM

## 2016-06-26 DIAGNOSIS — E440 Moderate protein-calorie malnutrition: Secondary | ICD-10-CM | POA: Diagnosis present

## 2016-06-26 DIAGNOSIS — Z86711 Personal history of pulmonary embolism: Secondary | ICD-10-CM

## 2016-06-26 DIAGNOSIS — Z7952 Long term (current) use of systemic steroids: Secondary | ICD-10-CM

## 2016-06-26 DIAGNOSIS — C770 Secondary and unspecified malignant neoplasm of lymph nodes of head, face and neck: Secondary | ICD-10-CM | POA: Diagnosis present

## 2016-06-26 DIAGNOSIS — I739 Peripheral vascular disease, unspecified: Secondary | ICD-10-CM | POA: Diagnosis present

## 2016-06-26 DIAGNOSIS — R4182 Altered mental status, unspecified: Secondary | ICD-10-CM | POA: Diagnosis not present

## 2016-06-26 DIAGNOSIS — Z7901 Long term (current) use of anticoagulants: Secondary | ICD-10-CM

## 2016-06-26 DIAGNOSIS — D7589 Other specified diseases of blood and blood-forming organs: Secondary | ICD-10-CM | POA: Diagnosis present

## 2016-06-26 DIAGNOSIS — D6859 Other primary thrombophilia: Secondary | ICD-10-CM | POA: Diagnosis present

## 2016-06-26 DIAGNOSIS — Z86010 Personal history of colonic polyps: Secondary | ICD-10-CM

## 2016-06-26 DIAGNOSIS — I4891 Unspecified atrial fibrillation: Secondary | ICD-10-CM | POA: Diagnosis not present

## 2016-06-26 LAB — BLOOD GAS, VENOUS
Acid-Base Excess: 1.6 mmol/L (ref 0.0–2.0)
BICARBONATE: 25.8 meq/L — AB (ref 20.0–24.0)
FIO2: 0.21
O2 SAT: 74.8 %
PATIENT TEMPERATURE: 98.6
PH VEN: 7.414 — AB (ref 7.250–7.300)
PO2 VEN: 43.4 mmHg (ref 31.0–45.0)
TCO2: 24.4 mmol/L (ref 0–100)
pCO2, Ven: 41.1 mmHg — ABNORMAL LOW (ref 45.0–50.0)

## 2016-06-26 LAB — COMPREHENSIVE METABOLIC PANEL
ALK PHOS: 66 U/L (ref 38–126)
ALT: 80 U/L — AB (ref 17–63)
AST: 50 U/L — AB (ref 15–41)
Albumin: 3.1 g/dL — ABNORMAL LOW (ref 3.5–5.0)
Anion gap: 8 (ref 5–15)
BILIRUBIN TOTAL: 0.7 mg/dL (ref 0.3–1.2)
BUN: 42 mg/dL — AB (ref 6–20)
CO2: 25 mmol/L (ref 22–32)
CREATININE: 1.65 mg/dL — AB (ref 0.61–1.24)
Calcium: 8.9 mg/dL (ref 8.9–10.3)
Chloride: 107 mmol/L (ref 101–111)
GFR calc Af Amer: 44 mL/min — ABNORMAL LOW (ref >60)
GFR, EST NON AFRICAN AMERICAN: 38 mL/min — AB (ref >60)
GLUCOSE: 125 mg/dL — AB (ref 65–99)
Potassium: 6 mmol/L — ABNORMAL HIGH (ref 3.5–5.1)
Sodium: 140 mmol/L (ref 135–145)
TOTAL PROTEIN: 6.3 g/dL — AB (ref 6.5–8.1)

## 2016-06-26 LAB — CBC WITH DIFFERENTIAL/PLATELET
BASOS PCT: 0 %
Basophils Absolute: 0 10*3/uL (ref 0.0–0.1)
EOS PCT: 0 %
Eosinophils Absolute: 0 10*3/uL (ref 0.0–0.7)
HEMATOCRIT: 29.1 % — AB (ref 39.0–52.0)
HEMOGLOBIN: 9.4 g/dL — AB (ref 13.0–17.0)
LYMPHS PCT: 14 %
Lymphs Abs: 1.2 10*3/uL (ref 0.7–4.0)
MCH: 29.4 pg (ref 26.0–34.0)
MCHC: 32.3 g/dL (ref 30.0–36.0)
MCV: 90.9 fL (ref 78.0–100.0)
MONO ABS: 0.1 10*3/uL (ref 0.1–1.0)
MONOS PCT: 1 %
NEUTROS PCT: 85 %
Neutro Abs: 7 10*3/uL (ref 1.7–7.7)
Platelets: 92 10*3/uL — ABNORMAL LOW (ref 150–400)
RBC: 3.2 MIL/uL — ABNORMAL LOW (ref 4.22–5.81)
RDW: 16.5 % — ABNORMAL HIGH (ref 11.5–15.5)
WBC: 8.3 10*3/uL (ref 4.0–10.5)

## 2016-06-26 LAB — I-STAT TROPONIN, ED: TROPONIN I, POC: 0.13 ng/mL — AB (ref 0.00–0.08)

## 2016-06-26 LAB — I-STAT CHEM 8, ED
BUN: 36 mg/dL — ABNORMAL HIGH (ref 6–20)
CHLORIDE: 105 mmol/L (ref 101–111)
CREATININE: 1.5 mg/dL — AB (ref 0.61–1.24)
Calcium, Ion: 1.14 mmol/L (ref 1.12–1.23)
GLUCOSE: 117 mg/dL — AB (ref 65–99)
HEMATOCRIT: 27 % — AB (ref 39.0–52.0)
Hemoglobin: 9.2 g/dL — ABNORMAL LOW (ref 13.0–17.0)
POTASSIUM: 5.8 mmol/L — AB (ref 3.5–5.1)
Sodium: 140 mmol/L (ref 135–145)
TCO2: 25 mmol/L (ref 0–100)

## 2016-06-26 LAB — I-STAT CG4 LACTIC ACID, ED: Lactic Acid, Venous: 2.01 mmol/L (ref 0.5–1.9)

## 2016-06-26 MED ORDER — TRAZODONE HCL 50 MG PO TABS
50.0000 mg | ORAL_TABLET | Freq: Every day | ORAL | Status: DC
Start: 2016-06-27 — End: 2016-06-29
  Administered 2016-06-27 – 2016-06-28 (×3): 50 mg via ORAL
  Filled 2016-06-26 (×3): qty 1

## 2016-06-26 MED ORDER — LORAZEPAM 2 MG/ML IJ SOLN
1.0000 mg | Freq: Once | INTRAMUSCULAR | Status: AC
  Administered 2016-06-26: 1 mg via INTRAMUSCULAR
  Filled 2016-06-26: qty 1

## 2016-06-26 MED ORDER — ATORVASTATIN CALCIUM 10 MG PO TABS
10.0000 mg | ORAL_TABLET | Freq: Every day | ORAL | Status: DC
  Administered 2016-06-27: 10 mg via ORAL
  Filled 2016-06-26: qty 1

## 2016-06-26 MED ORDER — SODIUM CHLORIDE 0.9 % IV BOLUS (SEPSIS)
1000.0000 mL | Freq: Once | INTRAVENOUS | Status: AC
Start: 2016-06-26 — End: 2016-06-26
  Administered 2016-06-26: 1000 mL via INTRAVENOUS

## 2016-06-26 MED ORDER — FOLIC ACID 1 MG PO TABS
1.0000 mg | ORAL_TABLET | Freq: Every day | ORAL | Status: DC
  Administered 2016-06-27: 1 mg via ORAL
  Filled 2016-06-26: qty 1

## 2016-06-26 MED ORDER — DEXTROSE 50 % IV SOLN
50.0000 mL | Freq: Once | INTRAVENOUS | Status: AC
Start: 2016-06-26 — End: 2016-06-26
  Administered 2016-06-26: 50 mL via INTRAVENOUS
  Filled 2016-06-26: qty 50

## 2016-06-26 MED ORDER — RED YEAST RICE 600 MG PO CAPS: 600.0000 mg | ORAL_CAPSULE | Freq: Every day | ORAL | Status: DC

## 2016-06-26 MED ORDER — TERAZOSIN HCL 5 MG PO CAPS
10.0000 mg | ORAL_CAPSULE | Freq: Every day | ORAL | Status: DC
  Administered 2016-06-27 – 2016-06-28 (×2): 10 mg via ORAL
  Filled 2016-06-26 (×2): qty 2

## 2016-06-26 MED ORDER — FINASTERIDE 5 MG PO TABS
5.0000 mg | ORAL_TABLET | Freq: Every day | ORAL | Status: DC
  Administered 2016-06-27 – 2016-06-29 (×3): 5 mg via ORAL
  Filled 2016-06-26 (×3): qty 1

## 2016-06-26 MED ORDER — SODIUM CHLORIDE 0.9 % IV SOLN
1.0000 g | Freq: Once | INTRAVENOUS | Status: AC
  Administered 2016-06-26: 1 g via INTRAVENOUS
  Filled 2016-06-26: qty 10

## 2016-06-26 MED ORDER — PANTOPRAZOLE SODIUM 40 MG PO TBEC
40.0000 mg | DELAYED_RELEASE_TABLET | Freq: Every day | ORAL | Status: DC
  Administered 2016-06-27 – 2016-06-29 (×3): 40 mg via ORAL
  Filled 2016-06-26 (×3): qty 1

## 2016-06-26 MED ORDER — MORPHINE SULFATE (PF) 2 MG/ML IV SOLN
1.0000 mg | INTRAVENOUS | Status: DC | PRN
Start: 2016-06-26 — End: 2016-06-29

## 2016-06-26 MED ORDER — COENZYME Q10 30 MG PO CAPS: 30.0000 mg | ORAL_CAPSULE | Freq: Every day | ORAL | Status: DC

## 2016-06-26 MED ORDER — OMEGA-3-ACID ETHYL ESTERS 1 G PO CAPS
1000.0000 mg | ORAL_CAPSULE | Freq: Every day | ORAL | Status: DC
  Administered 2016-06-27 – 2016-06-29 (×3): 1000 mg via ORAL
  Filled 2016-06-26 (×3): qty 1

## 2016-06-26 MED ORDER — ADULT MULTIVITAMIN W/MINERALS CH
1.0000 | ORAL_TABLET | Freq: Every day | ORAL | Status: DC
Start: 2016-06-27 — End: 2016-06-29
  Administered 2016-06-27 – 2016-06-29 (×3): 1 via ORAL
  Filled 2016-06-26 (×3): qty 1

## 2016-06-26 MED ORDER — PREDNISONE 20 MG PO TABS
20.0000 mg | ORAL_TABLET | Freq: Every day | ORAL | Status: DC
  Administered 2016-06-27 – 2016-06-28 (×2): 20 mg via ORAL
  Filled 2016-06-26 (×2): qty 1

## 2016-06-26 MED ORDER — NITROGLYCERIN 0.4 MG SL SUBL: 0.4000 mg | SUBLINGUAL_TABLET | SUBLINGUAL | Status: DC | PRN

## 2016-06-26 MED ORDER — FLUCONAZOLE 100 MG PO TABS
100.0000 mg | ORAL_TABLET | Freq: Every day | ORAL | Status: DC
  Administered 2016-06-27: 100 mg via ORAL

## 2016-06-26 MED ORDER — FLUOXETINE HCL 20 MG PO CAPS
20.0000 mg | ORAL_CAPSULE | Freq: Every day | ORAL | Status: DC
  Administered 2016-06-27 – 2016-06-29 (×3): 20 mg via ORAL
  Filled 2016-06-26 (×3): qty 1

## 2016-06-26 MED ORDER — VITAMIN C 500 MG PO TABS
1000.0000 mg | ORAL_TABLET | Freq: Every day | ORAL | Status: DC
  Administered 2016-06-27 – 2016-06-29 (×3): 1000 mg via ORAL
  Filled 2016-06-26 (×3): qty 2

## 2016-06-26 MED ORDER — INSULIN ASPART 100 UNIT/ML ~~LOC~~ SOLN
5.0000 [IU] | Freq: Once | SUBCUTANEOUS | Status: AC
  Administered 2016-06-26: 5 [IU] via INTRAVENOUS
  Filled 2016-06-26: qty 1

## 2016-06-26 NOTE — Progress Notes (Signed)
Called and spoke to patient's wife, I reviewed urinalysis results and I sent in ciprofloxacin to his pharmacy last week, I'm not sure why they were never called about this, it was sent to [facility] rather than to their typical pharmacy in [place], but either way, I am concerned that they never got a call from the pharmacy. Let's please call [facility] to see if they have any record of trying to contact the patient and if not, why.   We did inform them that I sent in antibiotics, see telephone encounter from 06/22/2016, I think the message was missed as far as which pharmacy. I don't see any scanned documents or reports for culture, not sure if I have reviewed these. Nothing is in my inbasket.

## 2016-06-26 NOTE — Progress Notes (Signed)
EDCM went to speak to patient at bedside, however patient is confused no family at bedside currently.

## 2016-06-26 NOTE — ED Provider Notes (Signed)
[facility] DEPT Provider Note   CSN: [id number] Arrival date & time: 06/26/16  1815  First Provider Contact:  First MD Initiated Contact with Patient 06/26/16 1836     INITIAL DOCUMENTATION DELAYED DUE TO EPIC DOWNTIME   History   Chief Complaint Chief Complaint  Patient presents with  . Altered Mental Status    HPI Henry Ford is a 81 y.o. male.  81 yo M with complicated PMHx including recent CVA, recent diagnosis of lung CA who presents with confusion. Per report from wife, over past 3-4 days pt has had increasing confusion and combativeness. He has been unwilling to bathe or care for himself, which is new. When not combative, pt has been more "lethargic" and less active than usual. He has been unable to carry out any meaningful conversations. Wife concerned due to pt's inability to care for self, and she is unable to care for him in his current state. No recent falls.   The history is provided by the patient, the spouse and medical records. The history is limited by the condition of the patient.    Past Medical History:  Diagnosis Date  . Anemia 12/2015  . Cancer ([place])    lung  . Colon polyps ~ 2001   unknown pathology, colonoscopy done at outside hospital.   . Gastrointestinal hemorrhage with melena    Status post EGD and blood transfusion March 2017   . Heme positive stool 12/2015  . Hypertension   . Ischemic foot ulcer due to atherosclerosis of native artery of limb ([place]) 01/10/2016  . Lung cancer, primary, with metastasis from lung to other site ([place]) 06/13/2016  . Peripheral vascular disease ([place]) 12/2015   non-healing ulcer right Hallux. S/P angioplasty and stenting of right common iliac 01/02/2016.  Marland Kitchen Prostate enlargement   . Right foot pain 12/30/2015   Admitted to [facility] 12/11/2015, discharge 12/14/2015, diagnoses included acute right great toe infection/cellulitis, peripheral vascular disease. ABI demonstrated arterial occlusive disease worse on left. Is  referred outpatient to vascular The CT scan documents for copy of his discharge summary. He should follow up with Dr. Ladona Horns, general surgery at [facility]. No dated 02/06/2  . Stroke [facility]) May 2017    Patient Active Problem List   Diagnosis Date Noted  . NSTEMI (non-ST elevated myocardial infarction) ([place]) 06/27/2016  . Stroke (cerebrum) ([place]) 06/27/2016  . Stroke ([place]) 06/26/2016  . Acute encephalopathy 06/26/2016  . Hyperkalemia 06/26/2016  . IDA (iron deficiency anemia) 06/20/2016  . Lung cancer, primary, with metastasis from lung to other site [facility]) 06/13/2016  . No advance directive on file 05/16/2016  . Acute ischemic right PCA stroke ([place]) 05/14/2016  . Acute CVA (cerebrovascular accident) ([place]) 05/14/2016  . Malnutrition of moderate degree 05/14/2016  . Pulmonary embolism ([place]) 05/14/2016  . Acute ischemic stroke ([place])   . Lung mass 05/09/2016  . Pulmonary embolism without acute cor pulmonale ([place]) 05/09/2016  . Deep venous thrombosis ([place]) 05/09/2016  . Protein-calorie malnutrition ([place]) 05/09/2016  . Chronic venous insufficiency 04/06/2016  . Decreased appetite 04/06/2016  . Acute renal failure superimposed on stage 3 chronic kidney disease ([place]) 04/06/2016  . Absolute anemia   . Gastrointestinal hemorrhage with melena   . Duodenal ulcer hemorrhagic   . Esophageal ulcer   . Esophageal stricture   . GIB (gastrointestinal bleeding) 01/20/2016  . Thrombocytosis ([place]) 01/20/2016  . Acute kidney injury ([place]) 01/20/2016  . Leukocytosis 01/20/2016  . Ischemic foot ulcer due to atherosclerosis of native artery of  limb ([place]) 01/10/2016  . Peripheral vascular disease ([place]) 01/10/2016  . Atherosclerosis of native arteries of the extremities with ulceration ([place]) 01/02/2016  . Benign fibroma of prostate 03/24/2014    Past Surgical History:  Procedure Laterality Date  . ESOPHAGOGASTRODUODENOSCOPY N/A 01/21/2016   Procedure: ESOPHAGOGASTRODUODENOSCOPY (EGD);   Surgeon: Irene Shipper, MD;  Location: [facility];  Service: Endoscopy;  Laterality: N/A;  . HYDROCELE EXCISION  1956  . PERIPHERAL VASCULAR CATHETERIZATION N/A 01/02/2016   Procedure: Abdominal Aortogram w/Lower Extremity;  Surgeon: Angelia Mould, MD;  Location: [facility];  Service: Cardiovascular;  Laterality: N/A;  . PERIPHERAL VASCULAR CATHETERIZATION  01/02/2016   Procedure: Peripheral Vascular Intervention;  Surgeon: Angelia Mould, MD;  Location: [facility] CV LAB;  Service: Cardiovascular;;       Home Medications    Prior to Admission medications   Medication Sig Start Date End Date Taking? Authorizing Provider  atorvastatin (LIPITOR) 10 MG tablet Take 1 tablet (10 mg total) by mouth daily at 6 PM. 05/21/16  Yes [name], DO  co-enzyme Q-10 30 MG capsule Take 30 mg by mouth daily.   Yes Historical Provider, MD  enoxaparin (LOVENOX) 120 MG/0.8ML injection Inject 0.71 mLs (106 mg total) into the skin daily. Take daily at 6 pm starting 05/16/2016. 06/14/16  Yes Eliezer Bottom, NP  finasteride (PROSCAR) 5 MG tablet Take 5 mg by mouth daily. Reported on 02/01/2016   Yes Historical Provider, MD  fluconazole (DIFLUCAN) 100 MG tablet Take 1 tablet (100 mg total) by mouth daily. 05/30/16  Yes Volanda Napoleon, MD  FLUoxetine (PROZAC) 20 MG tablet Take 1 tablet (20 mg total) by mouth daily. 05/24/16  Yes [name], DO  folic acid (FOLVITE) 1 MG tablet Take 1 tablet (1 mg total) by mouth daily. Start 5-7 days before Alimta chemotherapy. Continue until 21 days after Alimta completed. 06/20/16  Yes Volanda Napoleon, MD  Multiple Vitamin (MULTIVITAMIN) tablet Take 1 tablet by mouth daily.   Yes Historical Provider, MD  Omega 3 1000 MG CAPS Take 1,000 mg by mouth daily.   Yes Historical Provider, MD  ondansetron (ZOFRAN) 8 MG tablet Take 1 tablet (8 mg total) by mouth 2 (two) times daily as needed (Nausea or vomiting). 06/20/16  Yes Volanda Napoleon, MD  pantoprazole  (PROTONIX) 40 MG tablet Take 40 mg by mouth daily.  04/18/16  Yes Historical Provider, MD  predniSONE (DELTASONE) 20 MG tablet Take 1 tablet (20 mg total) by mouth daily with breakfast. 05/30/16  Yes Volanda Napoleon, MD  Red Yeast Rice 600 MG CAPS Take 600 mg by mouth daily.   Yes Historical Provider, MD  terazosin (HYTRIN) 10 MG capsule Take 10 mg by mouth at bedtime. Reported on 01/20/2016   Yes Historical Provider, MD  traZODone (DESYREL) 50 MG tablet Take 1 tablet (50 mg total) by mouth at bedtime. 06/13/16  Yes Volanda Napoleon, MD  vitamin C (ASCORBIC ACID) 500 MG tablet Take 1,000 mg by mouth daily.   Yes Historical Provider, MD  ciprofloxacin (CIPRO) 500 MG tablet Take 1 tablet (500 mg total) by mouth 2 (two) times daily. 06/27/16   [name], DO    Family History Family History  Problem Relation Age of Onset  . Diabetes Brother   . Colon cancer Neg Hx     Social History Social History  Substance Use Topics  . Smoking status: Former Smoker    Packs/day: 0.25    Years: 60.00  Types: Cigarettes    Start date: 07/03/1950    Quit date: 01/20/2016  . Smokeless tobacco: Never Used  . Alcohol use 6.0 oz/week    10 Cans of beer per week     Comment: beer     Allergies   Aspirin   Review of Systems Review of Systems  Unable to perform ROS: Dementia     Physical Exam Updated Vital Signs BP 134/78 (BP Location: Right Arm)   Pulse 60   Temp 97.8 F (36.6 C) (Oral)   Resp 16   Ht '5\' 9"'$  (1.753 m)   Wt 124 lb 1.9 oz (56.3 kg)   SpO2 98%   BMI 18.33 kg/m   Physical Exam  Constitutional:  Disheveled, elderly, appears mildly distressed  HENT:  Head: Normocephalic.  Mouth/Throat: Oropharynx is clear and moist. No oropharyngeal exudate.  Eyes: Conjunctivae are normal. Pupils are equal, round, and reactive to light.  Cardiovascular: Normal rate, regular rhythm and normal heart sounds.  Exam reveals no friction rub.   No murmur heard. Pulmonary/Chest: Effort normal  and breath sounds normal. He has no wheezes.  Abdominal: Soft. He exhibits no distension. There is no tenderness.  Musculoskeletal: He exhibits no edema.  Neurological:  Alert but disoriented, repetitive. Speech normal but content confused. MAE with at least antigravity strength. Withdraws to noxious stimuli in b/l UE and LE. Gait deferred.  Skin: Skin is warm.  Nursing note and vitals reviewed.    ED Treatments / Results  Labs (all labs ordered are listed, but only abnormal results are displayed) Labs Reviewed  CBC WITH DIFFERENTIAL/PLATELET - Abnormal; Notable for the following:       Result Value   RBC 3.20 (*)    Hemoglobin 9.4 (*)    HCT 29.1 (*)    RDW 16.5 (*)    Platelets 92 (*)    All other components within normal limits  COMPREHENSIVE METABOLIC PANEL - Abnormal; Notable for the following:    Potassium 6.0 (*)    Glucose, Bld 125 (*)    BUN 42 (*)    Creatinine, Ser 1.65 (*)    Total Protein 6.3 (*)    Albumin 3.1 (*)    AST 50 (*)    ALT 80 (*)    GFR calc non Af Amer 38 (*)    GFR calc Af Amer 44 (*)    All other components within normal limits  BLOOD GAS, VENOUS - Abnormal; Notable for the following:    pH, Ven 7.414 (*)    pCO2, Ven 41.1 (*)    Bicarbonate 25.8 (*)    All other components within normal limits  TROPONIN I - Abnormal; Notable for the following:    Troponin I 0.34 (*)    All other components within normal limits  TROPONIN I - Abnormal; Notable for the following:    Troponin I 0.30 (*)    All other components within normal limits  BRAIN NATRIURETIC PEPTIDE - Abnormal; Notable for the following:    B Natriuretic Peptide 337.3 (*)    All other components within normal limits  PROTIME-INR - Abnormal; Notable for the following:    Prothrombin Time 16.5 (*)    All other components within normal limits  APTT - Abnormal; Notable for the following:    aPTT 169 (*)    All other components within normal limits  CBC - Abnormal; Notable for the  following:    RBC 2.99 (*)    Hemoglobin 8.7 (*)  HCT 27.4 (*)    RDW 16.7 (*)    Platelets 74 (*)    All other components within normal limits  URINALYSIS, ROUTINE W REFLEX MICROSCOPIC (NOT AT [facility]) - Abnormal; Notable for the following:    APPearance CLOUDY (*)    Hgb urine dipstick MODERATE (*)    Leukocytes, UA LARGE (*)    All other components within normal limits  URINE MICROSCOPIC-ADD ON - Abnormal; Notable for the following:    Bacteria, UA MANY (*)    All other components within normal limits  I-STAT TROPOININ, ED - Abnormal; Notable for the following:    Troponin i, poc 0.13 (*)    All other components within normal limits  I-STAT CG4 LACTIC ACID, ED - Abnormal; Notable for the following:    Lactic Acid, Venous 2.01 (*)    All other components within normal limits  I-STAT CHEM 8, ED - Abnormal; Notable for the following:    Potassium 5.8 (*)    BUN 36 (*)    Creatinine, Ser 1.50 (*)    Glucose, Bld 117 (*)    Hemoglobin 9.2 (*)    HCT 27.0 (*)    All other components within normal limits  I-STAT CHEM 8, ED - Abnormal; Notable for the following:    BUN 31 (*)    Glucose, Bld 100 (*)    Hemoglobin 7.8 (*)    HCT 23.0 (*)    All other components within normal limits  LACTIC ACID, PLASMA  TROPONIN I  HEPARIN LEVEL (UNFRACTIONATED)  I-STAT CG4 LACTIC ACID, ED  CG4 I-STAT (LACTIC ACID)    EKG  EKG Interpretation None       Radiology Ct Head Wo Contrast  Addendum Date: 06/26/2016   ADDENDUM REPORT: 06/26/2016 20:28 ADDENDUM: The original report was by Dr. Van Clines. The following addendum is by Dr. Van Clines: These results were called by telephone at the time of interpretation on 06/26/2016 at 8:25 pm to Dr. Duffy Bruce , who verbally acknowledged these results. Electronically Signed   By: Van Clines M.D.   On: 06/26/2016 20:28   Result Date: 06/26/2016 CLINICAL DATA:  Altered mental status. Confusion over the past 2 days, progressive.  MRI from 05/14/2016 showed a large acute right posterior cerebral artery distribution infarct and small acute infarcts in both lobes of the cerebellum, the left occipital lobe, and both frontal lobes (suggesting embolic phenomenon). Recent PET-CT of 05/25/2016 showed metastatic disease in both lungs. EXAM: CT HEAD WITHOUT CONTRAST TECHNIQUE: Contiguous axial images were obtained from the base of the skull through the vertex without intravenous contrast. COMPARISON:  05/14/2016 FINDINGS: Encephalomalacia associated with the prior right PCA distribution infarct noted. There is a new moderate-sized infarct in the right frontal lobe, with area of encephalomalacia measuring about 2.8 by 3.6 by 3.0 cm on image 15/2. Prior areas of cerebellar infarcts are not well seen on today' s CT but were well shown on prior MRI. No intracranial hemorrhage or obvious intracranial mass lesion. IMPRESSION: 1. New right frontal lobe infarct, probably 71-day-old or older (subacute), measuring up to 3.6 cm in long axis. This was not present on the prior brain MRI of 05/14/2016. No intracranial hemorrhage. 2. Evolutionary findings in the older dominant infarcts which were acute back on 05/14/2016, including a right PCA distribution infarct. The pattern of acute infarcts on the prior MRI suggested in embolic source. 3. Although the patient has bilateral pulmonary masses compatible with metastatic disease, I do not see definite  metastatic disease to the brain on today's noncontrast CT. Electronically Signed: By: Van Clines M.D. On: 06/26/2016 20:20    Procedures Procedures (including critical care time)  Medications Ordered in ED Medications  morphine 2 MG/ML injection 1 mg (not administered)  nitroGLYCERIN (NITROSTAT) SL tablet 0.4 mg (not administered)  folic acid (FOLVITE) tablet 1 mg (not administered)  traZODone (DESYREL) tablet 50 mg (50 mg Oral Given 06/27/16 0304)  fluconazole (DIFLUCAN) tablet 100 mg (not  administered)  predniSONE (DELTASONE) tablet 20 mg (20 mg Oral Given 06/27/16 0935)  FLUoxetine (PROZAC) capsule 20 mg (not administered)  atorvastatin (LIPITOR) tablet 10 mg (not administered)  vitamin C (ASCORBIC ACID) tablet 1,000 mg (not administered)  pantoprazole (PROTONIX) EC tablet 40 mg (not administered)  multivitamin with minerals tablet 1 tablet (not administered)  omega-3 acid ethyl esters (LOVAZA) capsule 1,000 mg (not administered)  terazosin (HYTRIN) capsule 10 mg (not administered)  finasteride (PROSCAR) tablet 5 mg (not administered)  0.9 %  sodium chloride infusion ( Intravenous New Bag/Given 06/27/16 0305)  ondansetron (ZOFRAN) injection 4 mg (not administered)  senna-docusate (Senokot-S) tablet 1 tablet (not administered)  heparin ADULT infusion 100 units/mL (25000 units/228m sodium chloride 0.45%) (1,100 Units/hr Intravenous New Bag/Given 06/27/16 0305)  LORazepam (ATIVAN) injection 1 mg (1 mg Intramuscular Given 06/26/16 1930)  sodium chloride 0.9 % bolus 1,000 mL (0 mLs Intravenous Stopped 06/26/16 2152)  calcium gluconate 1 g in sodium chloride 0.9 % 100 mL IVPB (0 g Intravenous Stopped 06/26/16 2210)  insulin aspart (novoLOG) injection 5 Units (5 Units Intravenous Given 06/26/16 2145)  dextrose 50 % solution 50 mL (50 mLs Intravenous Given 06/26/16 2144)  sodium polystyrene (KAYEXALATE) 15 GM/60ML suspension 30 g (30 g Oral Given 06/27/16 0308)   stroke: mapping our early stages of recovery book (1 each Does not apply Given 06/27/16 0620)  heparin bolus via infusion 2,000 Units (2,000 Units Intravenous Bolus from Bag 06/27/16 0305)     Initial Impression / Assessment and Plan / ED Course  I have reviewed the triage vital signs and the nursing notes.  Pertinent labs & imaging results that were available during my care of the patient were reviewed by me and considered in my medical decision making (see chart for details).  Clinical Course  81yo M with complex PMHx as above who p/w  a 3-4 day h/o confusion and AMS. Initial DDx is broad and includes recurrent CVA, brain mets, subclinical seizures, occult infection (UTI, PNA), other metabolic derangements. EKG non-ischemic. Broad labs, stat CT head ordered. Pt protecting airway.  Labs, imaging as above. CT Head shows concern for new ischemic CVA, 2933days old, which fits with clinical history. Otherwise, no signs of UTI or PNA. CMP shows moderate hyperkalemia, but EKG without changes - I have temporized this with insulin, D50, and will give gentle hydration as pt still making urine. Trop elevated - suspect 2/2 demand, HTN in setting of CVA and EKG shows no ST changes.   Discussed case with Neurology on call. [facility] and [facility]-up - no further work-up indicated from their perspective. Will admit to medicine for further monitoring, possible palcement, trending of trops. Wife updated and in agreement.  Final Clinical Impressions(s) / ED Diagnoses   Final diagnoses:  Cerebrovascular accident (CVA) due to occlusion of cerebral artery ([place]  Confusion  Elevated troponin  Hyperkalemia      CDuffy Bruce MD 06/27/16 1039

## 2016-06-26 NOTE — ED Notes (Signed)
Patient refusing to have temperature taken, when used he states " NO NO NO"

## 2016-06-26 NOTE — H&P (Signed)
History and Physical    Henry Ford DJM:[id number] DOB: [birth year] DOA: 06/26/2016  Referring MD/NP/PA:   PCP: [name], DO   Patient coming from:  The patient is coming from home.  At baseline, pt is independent for most of ADL. SNF  Assistant living facility   Retirement center.       Chief Complaint: AMS and agitation.  HPI: Henry Ford is a 81 y.o. male with medical history significant of hypertension, hyperlipidemia, GERD, depression, recent stroke on 05/14/16, lung cancer ( biopsy of neck lymph node on 06/07/16 showed metastatic moderately to poorly differentiated adenocarcinoma), GI bleeding, anemia, BPH, CKD-III, DVT and PE on Lovenox, who presents with altered mental status, agitation.   Patient has AMS and is unable to provide accurate medical history, therefore, most of the history is obtained by discussing the case with ED physician, per EMS report, and with the nursing staff. History is limited. Per EDP, pt's wife stated that pt has been confused, agitated and yelling out in the past 2 days. He denies any pain any where. He does not have active coughing, nausea, vomiting, diarrhea. He moves all extremities.  ED Course: pt was found to have  lactate 2.01-->0.8. Troponin 0.13-->0.3, BNP 337.3, potassium of 5.8 without EKG change, slightly worsening renal function, pending urinalysis, no tachycardia, transient tachypnea. Pt is admitted to telemetry bed for further eval and treatment as inpatient. Neurology, Dr. Nicole Kindred was consulted by EDP.  # CT head showed:  1. New right frontal lobe infarct, probably 62-day-old or older (subacute), measuring up to 3.6 cm in long axis. No intracranial hemorrhage.2. Evolutionary findings in the older dominant infarcts which were acute back on 05/14/2016, including a right PCA distribution infarct. The pattern of acute infarcts on the prior MRI suggested in embolic source. 3. Although the patient has bilateral pulmonary masses compatible with  metastatic disease, no metastatic disease to the brain on today's noncontrast CT.  Review of Systems: Could not be reviewed due to altered mental status.  Allergy:  Allergies  Allergen Reactions  . Aspirin Other (See Comments)    Acid reflux    Past Medical History:  Diagnosis Date  . Anemia 12/2015  . Cancer ([place])    lung  . Colon polyps ~ 2001   unknown pathology, colonoscopy done at outside hospital.   . Gastrointestinal hemorrhage with melena    Status post EGD and blood transfusion March 2017   . Heme positive stool 12/2015  . Hypertension   . Ischemic foot ulcer due to atherosclerosis of native artery of limb ([place]) 01/10/2016  . Lung cancer, primary, with metastasis from lung to other site ([place]) 06/13/2016  . Peripheral vascular disease ([place]) 12/2015   non-healing ulcer right Hallux. S/P angioplasty and stenting of right common iliac 01/02/2016.  Marland Kitchen Prostate enlargement   . Right foot pain 12/30/2015   Admitted to [facility] 12/11/2015, discharge 12/14/2015, diagnoses included acute right great toe infection/cellulitis, peripheral vascular disease. ABI demonstrated arterial occlusive disease worse on left. Is referred outpatient to vascular The CT scan documents for copy of his discharge summary. He should follow up with Dr. Ladona Horns, general surgery at [facility]. No dated 02/06/2  . Stroke [facility]) May 2017    Past Surgical History:  Procedure Laterality Date  . ESOPHAGOGASTRODUODENOSCOPY N/A 01/21/2016   Procedure: ESOPHAGOGASTRODUODENOSCOPY (EGD);  Surgeon: Irene Shipper, MD;  Location: [facility];  Service: Endoscopy;  Laterality: N/A;  . HYDROCELE EXCISION  1956  . PERIPHERAL VASCULAR CATHETERIZATION N/A 01/02/2016  Procedure: Abdominal Aortogram w/Lower Extremity;  Surgeon: Angelia Mould, MD;  Location: [place] [facility];  Service: Cardiovascular;  Laterality: N/A;  . PERIPHERAL VASCULAR CATHETERIZATION  01/02/2016   Procedure: Peripheral Vascular  Intervention;  Surgeon: Angelia Mould, MD;  Location: [place] CV LAB;  Service: Cardiovascular;;    Social History:  reports that he quit smoking about 5 months ago. His smoking use included Cigarettes. He started smoking about 66 years ago. He has a 15.00 pack-year smoking history. He has never used smokeless tobacco. He reports that he drinks about 6.0 oz of alcohol per week . He reports that he does not use drugs.  Family History:  Family History  Problem Relation Age of Onset  . Diabetes Brother   . Colon cancer Neg Hx      Prior to Admission medications   Medication Sig Start Date End Date Taking? Authorizing Provider  atorvastatin (LIPITOR) 10 MG tablet Take 1 tablet (10 mg total) by mouth daily at 6 PM. 05/21/16   [name], DO  ciprofloxacin (CIPRO) 500 MG tablet Take 1 tablet (500 mg total) by mouth 2 (two) times daily. 06/22/16   [name], DO  co-enzyme Q-10 30 MG capsule Take 30 mg by mouth daily.    Historical Provider, MD  enoxaparin (LOVENOX) 120 MG/0.8ML injection Inject 0.71 mLs (106 mg total) into the skin daily. Take daily at 6 pm starting 05/16/2016. 06/14/16   Eliezer Bottom, NP  finasteride (PROSCAR) 5 MG tablet Take 5 mg by mouth daily. Reported on 02/01/2016    Historical Provider, MD  fluconazole (DIFLUCAN) 100 MG tablet Take 1 tablet (100 mg total) by mouth daily. 05/30/16   Volanda Napoleon, MD  FLUoxetine (PROZAC) 20 MG tablet Take 1 tablet (20 mg total) by mouth daily. 05/24/16   [name], DO  folic acid (FOLVITE) 1 MG tablet Take 1 tablet (1 mg total) by mouth daily. Start 5-7 days before Alimta chemotherapy. Continue until 21 days after Alimta completed. 06/20/16   Volanda Napoleon, MD  Multiple Vitamin (MULTIVITAMIN) tablet Take 1 tablet by mouth daily.    Historical Provider, MD  Omega 3 1000 MG CAPS Take 1,000 mg by mouth daily.    Historical Provider, MD  ondansetron (ZOFRAN) 8 MG tablet Take 1 tablet (8 mg total) by mouth 2 (two)  times daily as needed (Nausea or vomiting). 06/20/16   Volanda Napoleon, MD  pantoprazole (PROTONIX) 40 MG tablet Take 40 mg by mouth daily.  04/18/16   Historical Provider, MD  predniSONE (DELTASONE) 20 MG tablet Take 1 tablet (20 mg total) by mouth daily with breakfast. 05/30/16   Volanda Napoleon, MD  Red Yeast Rice 600 MG CAPS Take 600 mg by mouth daily.    Historical Provider, MD  terazosin (HYTRIN) 10 MG capsule Take 10 mg by mouth at bedtime. Reported on 01/20/2016    Historical Provider, MD  traZODone (DESYREL) 50 MG tablet Take 1 tablet (50 mg total) by mouth at bedtime. 06/13/16   Volanda Napoleon, MD  vitamin B-12 (CYANOCOBALAMIN) 1000 MCG tablet Take 1,000 mcg by mouth daily.    Historical Provider, MD  vitamin C (ASCORBIC ACID) 500 MG tablet Take 1,000 mg by mouth daily.    Historical Provider, MD    Physical Exam: Vitals:   06/27/16 0100 06/27/16 0130 06/27/16 0200 06/27/16 0244  BP: 166/70 163/75 150/73 (!) 149/57  Pulse: (!) 56 (!) 55 62 82  Resp: 14 14 14  15  Temp:    98.2 F (36.8 C)  TempSrc:    Oral  SpO2: 100% 100% 100% 97%  Weight:    56.3 kg (124 lb 1.9 oz)  Height:    '5\' 9"'$  (1.753 m)   General: Not in acute distress. Dry mucus and membrane HEENT:       Eyes: PERRL, EOMI, no scleral icterus.       ENT: No discharge from the ears and nose, no pharynx injection, no tonsillar enlargement.        Neck: No JVD, no bruit, no mass felt. Heme: No neck lymph node enlargement. Cardiac: S1/S2, RRR, No murmurs, No gallops or rubs. Pulm: No rales, wheezing, rhonchi or rubs. Abd: Soft, nondistended, nontender, no organomegaly, BS present. GU: No hematuria Ext: No pitting leg edema bilaterally. 2+DP/PT pulse bilaterally. Musculoskeletal: No joint deformities, No joint redness or warmth, no limitation of ROM in spin. Skin: No rashes.  Neuro: confused and not oriented X3, not cooperate to physical examination, cranial nerves II-XII grossly intact, moves all extremities.  Psych:  Patient is not psychotic, no suicidal or hemocidal ideation.  Labs on Admission: I have personally reviewed following labs and imaging studies  CBC:  Recent Labs Lab 06/20/16 1327 06/26/16 1950 06/26/16 1959 06/27/16 0032  WBC 15.0* 8.3  --   --   NEUTROABS 13.4* 7.0  --   --   HGB 9.2* 9.4* 9.2* 7.8*  HCT 29.0* 29.1* 27.0* 23.0*  MCV 95 90.9  --   --   PLT 162 92*  --   --    Basic Metabolic Panel:  Recent Labs Lab 06/20/16 1327 06/26/16 1950 06/26/16 1959 06/27/16 0032  NA 135 140 140 140  K 4.7 6.0* 5.8* 4.1  CL 106 107 105 103  CO2 29 25  --   --   GLUCOSE 142* 125* 117* 100*  BUN 35* 42* 36* 31*  CREATININE 1.0 1.65* 1.50* 1.20  CALCIUM 8.5 8.9  --   --    GFR: Estimated Creatinine Clearance: 40.4 mL/min (by C-G formula based on SCr of 1.2 mg/dL). Liver Function Tests:  Recent Labs Lab 06/20/16 1327 06/26/16 1950  AST 23 50*  ALT 21 80*  ALKPHOS 62 66  BILITOT 0.50 0.7  PROT 5.9* 6.3*  ALBUMIN 2.6* 3.1*   No results for input(s): LIPASE, AMYLASE in the last 168 hours. No results for input(s): AMMONIA in the last 168 hours. Coagulation Profile:  Recent Labs Lab 06/27/16 0312  INR 1.32   Cardiac Enzymes:  Recent Labs Lab 06/27/16 0022  TROPONINI 0.30*   BNP (last 3 results) No results for input(s): PROBNP in the last 8760 hours. HbA1C: No results for input(s): HGBA1C in the last 72 hours. CBG: No results for input(s): GLUCAP in the last 168 hours. Lipid Profile: No results for input(s): CHOL, HDL, LDLCALC, TRIG, CHOLHDL, LDLDIRECT in the last 72 hours. Thyroid Function Tests: No results for input(s): TSH, T4TOTAL, FREET4, T3FREE, THYROIDAB in the last 72 hours. Anemia Panel: No results for input(s): VITAMINB12, FOLATE, FERRITIN, TIBC, IRON, RETICCTPCT in the last 72 hours. Urine analysis:    Component Value Date/Time   COLORURINE YELLOW 05/14/2016 0006   APPEARANCEUR CLOUDY (A) 05/14/2016 0006   LABSPEC 1.015 05/14/2016 0006    PHURINE 5.5 05/14/2016 0006   GLUCOSEU NEGATIVE 05/14/2016 0006   HGBUR LARGE (A) 05/14/2016 0006   BILIRUBINUR NEGATIVE 05/14/2016 0006   KETONESUR NEGATIVE 05/14/2016 0006   PROTEINUR NEGATIVE 05/14/2016 0006  NITRITE POSITIVE (A) 05/14/2016 0006   LEUKOCYTESUR LARGE (A) 05/14/2016 0006   Sepsis Labs: '@LABRCNTIP'$ (procalcitonin:4,lacticidven:4) )No results found for this or any previous visit (from the past 240 hour(s)).   Radiological Exams on Admission: Ct Head Wo Contrast  Addendum Date: 06/26/2016   ADDENDUM REPORT: 06/26/2016 20:28 ADDENDUM: The original report was by Dr. Van Clines. The following addendum is by Dr. Van Clines: These results were called by telephone at the time of interpretation on 06/26/2016 at 8:25 pm to Dr. Duffy Bruce , who verbally acknowledged these results. Electronically Signed   By: Van Clines M.D.   On: 06/26/2016 20:28   Result Date: 06/26/2016 CLINICAL DATA:  Altered mental status. Confusion over the past 2 days, progressive. MRI from 05/14/2016 showed a large acute right posterior cerebral artery distribution infarct and small acute infarcts in both lobes of the cerebellum, the left occipital lobe, and both frontal lobes (suggesting embolic phenomenon). Recent PET-CT of 05/25/2016 showed metastatic disease in both lungs. EXAM: CT HEAD WITHOUT CONTRAST TECHNIQUE: Contiguous axial images were obtained from the base of the skull through the vertex without intravenous contrast. COMPARISON:  05/14/2016 FINDINGS: Encephalomalacia associated with the prior right PCA distribution infarct noted. There is a new moderate-sized infarct in the right frontal lobe, with area of encephalomalacia measuring about 2.8 by 3.6 by 3.0 cm on image 15/2. Prior areas of cerebellar infarcts are not well seen on today' s CT but were well shown on prior MRI. No intracranial hemorrhage or obvious intracranial mass lesion. IMPRESSION: 1. New right frontal lobe infarct,  probably 28-day-old or older (subacute), measuring up to 3.6 cm in long axis. This was not present on the prior brain MRI of 05/14/2016. No intracranial hemorrhage. 2. Evolutionary findings in the older dominant infarcts which were acute back on 05/14/2016, including a right PCA distribution infarct. The pattern of acute infarcts on the prior MRI suggested in embolic source. 3. Although the patient has bilateral pulmonary masses compatible with metastatic disease, I do not see definite metastatic disease to the brain on today's noncontrast CT. Electronically Signed: By: Van Clines M.D. On: 06/26/2016 20:20     EKG: Independently reviewed. EKG is in poor quality. Sinus rhythm, QTC 470, supraventricular bigeminy, no T-wave peaking, Assessment/Plan Principal Problem:   Stroke [facility]) Active Problems:   Peripheral vascular disease (HCC)   GIB (gastrointestinal bleeding)   Thrombocytosis (HCC)   Acute renal failure superimposed on stage 3 chronic kidney disease (HCC)   Deep venous thrombosis (HCC)   Protein-calorie malnutrition (HCC)   Pulmonary embolism (HCC)   Lung cancer, primary, with metastasis from lung to other site ([place])   IDA (iron deficiency anemia)   Acute encephalopathy   Hyperkalemia   NSTEMI (non-ST elevated myocardial infarction) ([place])   Stroke [facility]): pt had stroke recently. MRI of brain on 05/14/16 showed acute large RIGHT posterior cerebral artery territory infarct (RIGHT temporal occipital lobe); additional small acute infarcts bilateral cerebella, LEFT occipital lobe and bifrontal lobes. Findings most consistent with embolic phenomena; also trace subacute RIGHT frontal subdural hematoma. Now presents with new right frontal lobe subacute stroke as evidenced by CT-head. This explain his agitation and confusion. Neuro, Dr. Nicole Kindred was consulted by EDP, no further workup of stroke needed at this moment since patient had complete stroke workup in previous admission. Given his  overall health condition, palliative care should be the right direction.  -admit to tele bed as inpt. -Continue lipitor -consult to palliative care -pt passed swallowing screen in ED.  Hx of DVT and PE: on levenox at home. -will switch to IV heparin since pt also has NSTEMI today, in case pt need cath thought pt may not be a candidate.  NSTMEI: Troponin elevated at 0.13--> 0.3, not sure if patient has chest pain -continue Lipitor  -cycle CE q6 x3 and repeat her EKG in the am  - Nitroglycerin, Morphine,  - No Aspirin due to hx of GIB.   - Risk factor stratification: will check FLP and A1C: A1c was5.4 on 05/14/16 and LDL was 102 on 05/14/16 - 2d echo - please call Card in AM  Acute renal failure superimposed on stage 3 chronic kidney disease: Patient's renal function slightly worsening. Baseline creatinine 1.3-1.4. His creatinine is 1.65, BUN 42, possibly due to prerenal failure. -IV fluid: 1 L normal saline, then 100 mL per hour -Follow-up renal function. BNP  Thrombocytosis ([place]): platelet 92.  Patient is on Lovenox at home. No bleeding tendency. -f/u by CBC  Protein-calorie malnutrition ([place]): -consult to nutrition  Hyperkalemia: K=5.8 without EKG change. Pt was treated with calcium gluconate, D50, and Novology in ED.  -Give one dose of Kayexalate 30 g 1  Lung cancer, primary, with metastasis from lung to other site-stage IV: biopsy of neck lymph node on 06/07/16 showed metastatic moderately to poorly differentiated adenocarcinoma. Pt was seen by oncology, NP Sarah on 06/13/16. It seems that the treatment plan has not been made. -f/u with oncology.   DVT ppx: on IV heparin Code Status: Full code Family Communication: None at bed side.  Disposition Plan:  Anticipate discharge SNF for rehab Consults called:  Neuro, Dr. Nicole Kindred was consulted by EDP Admission status: Inpatient/tele   Date of Service 06/27/2016    Ivor Costa [facility] Pager [phone]  If 7PM-7AM,  please contact night-coverage [email or web address] Password [email or web address] 06/27/2016, 5:07 AM

## 2016-06-26 NOTE — Telephone Encounter (Signed)
Patient wife called requested a rx sent to pharmacy for patient kidney infection. Please advise. Toddy Boyd,CMA

## 2016-06-26 NOTE — Telephone Encounter (Signed)
Spoke to the patient's wife, seeing counter. We did inform his wife that an antibiotic was sent last week, can we call [facility] and figure out whether they ever tried to contact the patient about a prescription to pick up? I think what happened was we had this pharmacy in their record for the blood thinners, but typically they use [facility], probably we didn't confirm the pharmacy with them when I sent the antibiotic. At any rate, I spoke to patient today, she is actually taking him to the hospital due to concern for mental status changes, so I did not write any prescriptions at this point

## 2016-06-26 NOTE — Progress Notes (Signed)
EDCM spoke to patient's wife at bedside. Patient lives at home with his wife. Patient's wife reports she thinks the home health services from [facility] have stopped. She reports she has been in contact with hospice and has discussed hospice with patient's oncologist Dr. Jonette Eva.  She reports patient may need hospice at some point. Pcp Dr. Greg Cutter Patient has a rolling walker and oxygen at home. Patient's wife inquiring if patient is going to be admitted.  Informed EDRN.   No further EDCM needs at this time.

## 2016-06-26 NOTE — ED Triage Notes (Signed)
Patient presents to [facility] from home with his wife. She states that for past two days he has been increasingly confused and altered. Today he is yelling out, denies pain, stating 'i want to die', 'why won't you let me die'. Wife says he is a DNR. Obvious wound to right great toe, denies pain but yells out like referred pain. Hearing aid in right ear. Says 'no' or 'i don't know' when asked orientation questions. Wife says he had a stroke a few weeks ago.

## 2016-06-26 NOTE — ED Notes (Signed)
Bed: WA06 Expected date:  Expected time:  Means of arrival:  Comments: triage

## 2016-06-26 NOTE — Telephone Encounter (Signed)
Per Dr. Marin Olp request contacted pt to check on his status.   Per Ms. Boisselle pt has been combative and "is barely coherent." States "I'm just trying not to get him upset again."   Advised wife per Dr Marin Olp to transport pt to ED. Wife states "I just wanted to see how the day goes." States I am afraid his cancer is affecting his brain." Educated her that by sending him to ER, tests can be performed quickly to determine what is the cause of the cognitive change. Continues to state that she wants to monitor pt at home "at least until my daughter comes later, because EMS charged me $276 last time." Emphasized the importance of rapid response. Wife verbalizes understanding but is insistent on waiting.  According to recent note from Dr Sheppard Coil, pt has UTI. Ms. Spirito reports he has not started antibiotics because "he always keeps a kidney infection and its probably just the same thing." Encouraged and educated Ms. Tomson to start antibiotic ASAP.

## 2016-06-26 NOTE — ED Notes (Signed)
Lab draw delayed until pt receives ativan

## 2016-06-27 ENCOUNTER — Inpatient Hospital Stay (HOSPITAL_COMMUNITY): Payer: Commercial Managed Care - HMO

## 2016-06-27 ENCOUNTER — Other Ambulatory Visit: Payer: Self-pay

## 2016-06-27 ENCOUNTER — Encounter (HOSPITAL_COMMUNITY): Payer: Self-pay | Admitting: Nurse Practitioner

## 2016-06-27 DIAGNOSIS — N183 Chronic kidney disease, stage 3 (moderate): Secondary | ICD-10-CM | POA: Diagnosis present

## 2016-06-27 DIAGNOSIS — R778 Other specified abnormalities of plasma proteins: Secondary | ICD-10-CM | POA: Diagnosis present

## 2016-06-27 DIAGNOSIS — E785 Hyperlipidemia, unspecified: Secondary | ICD-10-CM | POA: Diagnosis present

## 2016-06-27 DIAGNOSIS — I214 Non-ST elevation (NSTEMI) myocardial infarction: Secondary | ICD-10-CM | POA: Diagnosis present

## 2016-06-27 DIAGNOSIS — C3490 Malignant neoplasm of unspecified part of unspecified bronchus or lung: Secondary | ICD-10-CM | POA: Diagnosis present

## 2016-06-27 DIAGNOSIS — T451X5A Adverse effect of antineoplastic and immunosuppressive drugs, initial encounter: Secondary | ICD-10-CM | POA: Diagnosis present

## 2016-06-27 DIAGNOSIS — Z515 Encounter for palliative care: Secondary | ICD-10-CM

## 2016-06-27 DIAGNOSIS — I63529 Cerebral infarction due to unspecified occlusion or stenosis of unspecified anterior cerebral artery: Secondary | ICD-10-CM | POA: Diagnosis not present

## 2016-06-27 DIAGNOSIS — I129 Hypertensive chronic kidney disease with stage 1 through stage 4 chronic kidney disease, or unspecified chronic kidney disease: Secondary | ICD-10-CM | POA: Diagnosis present

## 2016-06-27 DIAGNOSIS — R4182 Altered mental status, unspecified: Secondary | ICD-10-CM | POA: Diagnosis present

## 2016-06-27 DIAGNOSIS — G9340 Encephalopathy, unspecified: Secondary | ICD-10-CM

## 2016-06-27 DIAGNOSIS — D6859 Other primary thrombophilia: Secondary | ICD-10-CM | POA: Diagnosis present

## 2016-06-27 DIAGNOSIS — I82403 Acute embolism and thrombosis of unspecified deep veins of lower extremity, bilateral: Secondary | ICD-10-CM | POA: Diagnosis not present

## 2016-06-27 DIAGNOSIS — E440 Moderate protein-calorie malnutrition: Secondary | ICD-10-CM | POA: Diagnosis present

## 2016-06-27 DIAGNOSIS — N179 Acute kidney failure, unspecified: Secondary | ICD-10-CM | POA: Diagnosis present

## 2016-06-27 DIAGNOSIS — E875 Hyperkalemia: Secondary | ICD-10-CM | POA: Diagnosis present

## 2016-06-27 DIAGNOSIS — I82401 Acute embolism and thrombosis of unspecified deep veins of right lower extremity: Secondary | ICD-10-CM | POA: Diagnosis not present

## 2016-06-27 DIAGNOSIS — R451 Restlessness and agitation: Secondary | ICD-10-CM | POA: Diagnosis present

## 2016-06-27 DIAGNOSIS — R7989 Other specified abnormal findings of blood chemistry: Secondary | ICD-10-CM

## 2016-06-27 DIAGNOSIS — C770 Secondary and unspecified malignant neoplasm of lymph nodes of head, face and neck: Secondary | ICD-10-CM | POA: Diagnosis present

## 2016-06-27 DIAGNOSIS — I6350 Cerebral infarction due to unspecified occlusion or stenosis of unspecified cerebral artery: Secondary | ICD-10-CM

## 2016-06-27 DIAGNOSIS — Z66 Do not resuscitate: Secondary | ICD-10-CM | POA: Diagnosis present

## 2016-06-27 DIAGNOSIS — I082 Rheumatic disorders of both aortic and tricuspid valves: Secondary | ICD-10-CM | POA: Diagnosis present

## 2016-06-27 DIAGNOSIS — I63431 Cerebral infarction due to embolism of right posterior cerebral artery: Secondary | ICD-10-CM | POA: Diagnosis present

## 2016-06-27 DIAGNOSIS — D696 Thrombocytopenia, unspecified: Secondary | ICD-10-CM | POA: Diagnosis present

## 2016-06-27 DIAGNOSIS — I4891 Unspecified atrial fibrillation: Secondary | ICD-10-CM | POA: Diagnosis not present

## 2016-06-27 DIAGNOSIS — K219 Gastro-esophageal reflux disease without esophagitis: Secondary | ICD-10-CM | POA: Diagnosis present

## 2016-06-27 DIAGNOSIS — D509 Iron deficiency anemia, unspecified: Secondary | ICD-10-CM | POA: Diagnosis present

## 2016-06-27 DIAGNOSIS — I739 Peripheral vascular disease, unspecified: Secondary | ICD-10-CM | POA: Diagnosis present

## 2016-06-27 DIAGNOSIS — D7589 Other specified diseases of blood and blood-forming organs: Secondary | ICD-10-CM | POA: Diagnosis present

## 2016-06-27 DIAGNOSIS — I639 Cerebral infarction, unspecified: Secondary | ICD-10-CM | POA: Diagnosis not present

## 2016-06-27 DIAGNOSIS — D6959 Other secondary thrombocytopenia: Secondary | ICD-10-CM | POA: Diagnosis present

## 2016-06-27 DIAGNOSIS — I213 ST elevation (STEMI) myocardial infarction of unspecified site: Secondary | ICD-10-CM | POA: Diagnosis not present

## 2016-06-27 DIAGNOSIS — N400 Enlarged prostate without lower urinary tract symptoms: Secondary | ICD-10-CM | POA: Diagnosis present

## 2016-06-27 DIAGNOSIS — E46 Unspecified protein-calorie malnutrition: Secondary | ICD-10-CM | POA: Diagnosis not present

## 2016-06-27 LAB — I-STAT CHEM 8, ED
BUN: 31 mg/dL — ABNORMAL HIGH (ref 6–20)
CHLORIDE: 103 mmol/L (ref 101–111)
Calcium, Ion: 1.2 mmol/L (ref 1.12–1.23)
Creatinine, Ser: 1.2 mg/dL (ref 0.61–1.24)
GLUCOSE: 100 mg/dL — AB (ref 65–99)
HEMATOCRIT: 23 % — AB (ref 39.0–52.0)
HEMOGLOBIN: 7.8 g/dL — AB (ref 13.0–17.0)
POTASSIUM: 4.1 mmol/L (ref 3.5–5.1)
Sodium: 140 mmol/L (ref 135–145)
TCO2: 25 mmol/L (ref 0–100)

## 2016-06-27 LAB — URINALYSIS, ROUTINE W REFLEX MICROSCOPIC
BILIRUBIN URINE: NEGATIVE
GLUCOSE, UA: NEGATIVE mg/dL
KETONES UR: NEGATIVE mg/dL
NITRITE: NEGATIVE
PROTEIN: NEGATIVE mg/dL
Specific Gravity, Urine: 1.019 (ref 1.005–1.030)
pH: 7 (ref 5.0–8.0)

## 2016-06-27 LAB — CG4 I-STAT (LACTIC ACID): Lactic Acid, Venous: 0.52 mmol/L (ref 0.5–1.9)

## 2016-06-27 LAB — TROPONIN I
Troponin I: 0.3 ng/mL (ref <0.03)
Troponin I: 0.34 ng/mL (ref <0.03)
Troponin I: 0.32 ng/mL (ref <0.03)

## 2016-06-27 LAB — CBC
HCT: 27.4 % — ABNORMAL LOW (ref 39.0–52.0)
Hemoglobin: 8.7 g/dL — ABNORMAL LOW (ref 13.0–17.0)
MCH: 29.1 pg (ref 26.0–34.0)
MCHC: 31.8 g/dL (ref 30.0–36.0)
MCV: 91.6 fL (ref 78.0–100.0)
PLATELETS: 74 10*3/uL — AB (ref 150–400)
RBC: 2.99 MIL/uL — ABNORMAL LOW (ref 4.22–5.81)
RDW: 16.7 % — AB (ref 11.5–15.5)
WBC: 8.2 10*3/uL (ref 4.0–10.5)

## 2016-06-27 LAB — ECHOCARDIOGRAM COMPLETE
Height: 69 in
WEIGHTICAEL: 2105.83 [oz_av]

## 2016-06-27 LAB — URINE MICROSCOPIC-ADD ON: Squamous Epithelial / LPF: NONE SEEN

## 2016-06-27 LAB — BRAIN NATRIURETIC PEPTIDE: B Natriuretic Peptide: 337.3 pg/mL — ABNORMAL HIGH (ref 0.0–100.0)

## 2016-06-27 LAB — APTT: APTT: 169 s — AB (ref 24–36)

## 2016-06-27 LAB — PROTIME-INR
INR: 1.32
PROTHROMBIN TIME: 16.5 s — AB (ref 11.4–15.2)

## 2016-06-27 LAB — LACTIC ACID, PLASMA: LACTIC ACID, VENOUS: 0.8 mmol/L (ref 0.5–1.9)

## 2016-06-27 MED ORDER — CIPROFLOXACIN HCL 500 MG PO TABS: 500.0000 mg | ORAL_TABLET | Freq: Two times a day (BID) | ORAL | 0 refills | Status: DC

## 2016-06-27 MED ORDER — METOPROLOL TARTRATE 25 MG PO TABS
12.5000 mg | ORAL_TABLET | Freq: Two times a day (BID) | ORAL | Status: DC
  Administered 2016-06-27 – 2016-06-29 (×4): 12.5 mg via ORAL
  Filled 2016-06-27 (×4): qty 1

## 2016-06-27 MED ORDER — ENOXAPARIN SODIUM 120 MG/0.8ML ~~LOC~~ SOLN: 106.0000 mg | SUBCUTANEOUS | Status: DC

## 2016-06-27 MED ORDER — SODIUM CHLORIDE 0.9 % IV SOLN
INTRAVENOUS | Status: DC
  Administered 2016-06-27 (×3): via INTRAVENOUS

## 2016-06-27 MED ORDER — SENNOSIDES-DOCUSATE SODIUM 8.6-50 MG PO TABS: 1.0000 | ORAL_TABLET | Freq: Every evening | ORAL | Status: DC | PRN

## 2016-06-27 MED ORDER — HEPARIN (PORCINE) IN NACL 100-0.45 UNIT/ML-% IJ SOLN
1100.0000 [IU]/h | INTRAMUSCULAR | Status: DC
  Administered 2016-06-27: 1100 [IU]/h via INTRAVENOUS
  Filled 2016-06-27 (×2): qty 250

## 2016-06-27 MED ORDER — ENOXAPARIN SODIUM 60 MG/0.6ML ~~LOC~~ SOLN
1.0000 mg/kg | Freq: Two times a day (BID) | SUBCUTANEOUS | Status: DC
  Administered 2016-06-27 – 2016-06-28 (×2): 60 mg via SUBCUTANEOUS
  Filled 2016-06-27 (×2): qty 0.6

## 2016-06-27 MED ORDER — STROKE: EARLY STAGES OF RECOVERY BOOK
Freq: Once | Status: AC
  Administered 2016-06-27: 1
  Filled 2016-06-27: qty 1

## 2016-06-27 MED ORDER — HEPARIN BOLUS VIA INFUSION
2000.0000 [IU] | Freq: Once | INTRAVENOUS | Status: AC
  Administered 2016-06-27: 2000 [IU] via INTRAVENOUS
  Filled 2016-06-27: qty 2000

## 2016-06-27 MED ORDER — DILTIAZEM HCL 25 MG/5ML IV SOLN
10.0000 mg | Freq: Once | INTRAVENOUS | Status: AC
  Administered 2016-06-27: 10 mg via INTRAVENOUS
  Filled 2016-06-27: qty 5

## 2016-06-27 MED ORDER — ONDANSETRON HCL 4 MG/2ML IJ SOLN: 4.0000 mg | Freq: Three times a day (TID) | INTRAMUSCULAR | Status: DC | PRN

## 2016-06-27 MED ORDER — HALOPERIDOL LACTATE 5 MG/ML IJ SOLN
2.0000 mg | Freq: Once | INTRAMUSCULAR | Status: AC
  Administered 2016-06-27: 2 mg via INTRAVENOUS
  Filled 2016-06-27: qty 1

## 2016-06-27 MED ORDER — ENOXAPARIN SODIUM 60 MG/0.6ML ~~LOC~~ SOLN: 1.0000 mg/kg | Freq: Two times a day (BID) | SUBCUTANEOUS | Status: DC

## 2016-06-27 MED ORDER — SODIUM POLYSTYRENE SULFONATE 15 GM/60ML PO SUSP
30.0000 g | Freq: Once | ORAL | Status: AC
  Administered 2016-06-27: 30 g via ORAL
  Filled 2016-06-27: qty 120

## 2016-06-27 NOTE — Progress Notes (Addendum)
ANTICOAGULATION CONSULT NOTE - Initial Consult  Pharmacy Consult for Heparin Indication: pulmonary embolus and DVT, + troponin while on enoxaparin  Allergies  Allergen Reactions  . Aspirin Other (See Comments)    Acid reflux    Patient Measurements:   Heparin Dosing Weight:   Vital Signs: Temp: 97.5 F (36.4 C) (08/08 2330) BP: 150/73 (08/09 0200) Pulse Rate: 62 (08/09 0200)  Labs:  Recent Labs  06/26/16 1950 06/26/16 1959 06/27/16 0022 06/27/16 0032  HGB 9.4* 9.2*  --  7.8*  HCT 29.1* 27.0*  --  23.0*  PLT 92*  --   --   --   CREATININE 1.65* 1.50*  --  1.20  TROPONINI  --   --  0.30*  --     Estimated Creatinine Clearance: 44.9 mL/min (by C-G formula based on SCr of 1.2 mg/dL).   Medical History: Past Medical History:  Diagnosis Date  . Anemia 12/2015  . Cancer ([place])    lung  . Colon polyps ~ 2001   unknown pathology, colonoscopy done at outside hospital.   . Gastrointestinal hemorrhage with melena    Status post EGD and blood transfusion March 2017   . Heme positive stool 12/2015  . Hypertension   . Ischemic foot ulcer due to atherosclerosis of native artery of limb ([place]) 01/10/2016  . Lung cancer, primary, with metastasis from lung to other site ([place]) 06/13/2016  . Peripheral vascular disease ([place]) 12/2015   non-healing ulcer right Hallux. S/P angioplasty and stenting of right common iliac 01/02/2016.  Marland Kitchen Prostate enlargement   . Right foot pain 12/30/2015   Admitted to [facility] 12/11/2015, discharge 12/14/2015, diagnoses included acute right great toe infection/cellulitis, peripheral vascular disease. ABI demonstrated arterial occlusive disease worse on left. Is referred outpatient to vascular The CT scan documents for copy of his discharge summary. He should follow up with Dr. Ladona Horns, general surgery at [facility]. No dated 02/06/2  . Stroke [facility]) May 2017    Medications:   (Not in a hospital admission) Scheduled:  .  stroke: mapping  our early stages of recovery book   Does not apply Once  . atorvastatin  10 mg Oral q1800  . co-enzyme Q-10  30 mg Oral Daily  . finasteride  5 mg Oral Daily  . fluconazole  100 mg Oral Daily  . FLUoxetine  20 mg Oral Daily  . folic acid  1 mg Oral Daily  . heparin  2,000 Units Intravenous Once  . multivitamin  1 tablet Oral Daily  . Omega 3  1,000 mg Oral Daily  . pantoprazole  40 mg Oral Daily  . predniSONE  20 mg Oral Q breakfast  . Red Yeast Rice  600 mg Oral Daily  . sodium polystyrene  30 g Oral Once  . terazosin  10 mg Oral QHS  . traZODone  50 mg Oral QHS  . vitamin C  1,000 mg Oral Daily   Infusions:  . sodium chloride    . heparin      Assessment: Patient on 1.'5mg'$ /kg sq enoxaparin for hx of PE/DVT in ED with positive troponin per MD.  Baseline coags ordered.  Last enoxaparin dose noted on 8/7 at 1800.    Goal of Therapy:  Heparin level 0.3-0.7 units/ml Monitor platelets by anticoagulation protocol: Yes   Plan:  Heparin bolus 2000 units iv x1 Heparin drip at 1100 units/hr Daily CBC Next heparin level at [place], Shea Stakes [place] 06/27/2016,2:30 AM

## 2016-06-27 NOTE — Progress Notes (Signed)
PROGRESS NOTE                                                                                                                                                                                                             Patient Demographics:    Henry Ford, is a 81 y.o. male, DOB - 1935-06-15, KVQ:[id number]  Admit date - 06/26/2016   Admitting Physician Ivor Costa, MD  Outpatient Primary MD for the patient is [name], DO  LOS - 0  [facility]   Chief Complaint  Patient presents with  . Altered Mental Status       Brief Narrative   81 year old male with history of hypertension, hyperlipidemia, recent stroke in June 2017, lung cancer (neck) biopsy on 7/20 showing metastatic moderately to poorly differentiated adenocarcinoma), GI bleed, anemia, BPH, CK D stage III, DVT and PE on Lovenox brought to the ED with agitation and altered mental status. In the ED he was found to have a lactate of 2. troponin of 0.13 which further elevated to 0.3, BNP of 337 and potassium of 5.8. He'll function mildly worsened. CT head showed new right frontal lobe infarct (subacute), without intracranial hemorrhage. Also showed evolutionary findings in the order dominant infarcts including a right PCA distribution infarct. Admitted to telemetry for further management.   Subjective:   Patient denies any headache or weakness. Denies any chest pain symptoms   Assessment  & Plan :    Principal Problem:   Stroke [facility]) Suspect embolic. Agitation and confusion likely related to new right frontal lobe infarct. Now resolved. Hospitalist Dr. Nicole Kindred was consulted by ED physician who recommended no further workup since his recent aggressive workup done. Given overall decline in his health condition palliative care was consulted.  Palliative team discussed with family for recommended patient to be made DO NOT RESUSCITATE and seriously  considering on pursuing further chemotherapy and would like to discuss with Dr. Marin Olp.    Active Problems: Thrombocytopenia ([place]) Patient was started on Lovenox after his stroke in June and recently diagnosed bilateral DVT and PE (decided to switch him to Lovenox as patient was already on eliquis) Patient noted for significant drop in platelets. Suspect due to chemotherapy. Held  anticoagulation. I have ordered for HIT ab panel. Monitor platelets closely.    Acute renal failure superimposed on stage 3 chronic  kidney disease ([place]) Monitor with gentle hydration.  Elevated troponin/ ? NSTEMI No significant EKG change. Suspect demand ischemia with associated acute kidney injury. Patient denies chest pain symptoms. However 2-D echo with decreased EF of 45-50% with diffuse hypokinesis. This is new from his echo 2 months back. Grade 1 diastolic dysfunction. Currently euvolemic. Will add low-dose beta blocker . Not a candidate for cardiac intervention     Deep venous thrombosis (H[facility]) and PE Monitor dropping platelets  while on lovenox    Protein-calorie malnutrition [facility]) Nutrition consult. Speech therapy to follow.     Lung cancer, primary, with metastasis from lung to other site [facility]) Follows with Dr. Marin Olp. Family seriously considering continuation of chemotherapy. They want to discuss with Dr. Marin Olp. He will see pt in am.   Hyperkalemia Resolved. Stable on telemetry.         Code Status : DO NOT RESUSCITATE  Family Communication  :  None at bedside.  Disposition Plan  :  Pending PT evaluation  Barriers For Discharge :  Active symptoms  Consults  :   Palliative care  Procedures  : head CT  DVT Prophylaxis  :  SCDs  Lab Results  Component Value Date   PLT 74 (L) 06/27/2016    Antibiotics  :    Anti-infectives    Start     Dose/Rate Route Frequency Ordered Stop   06/27/16 1000  fluconazole (DIFLUCAN) tablet 100 mg     100 mg Oral Daily 06/26/16 2315            Objective:   Vitals:   06/27/16 0642 06/27/16 0854 06/27/16 1100 06/27/16 1258  BP: (!) 135/48 134/78  (!) 133/41  Pulse: 69 60  60  Resp: '12 16  16  '$ Temp: 97.5 F (36.4 C) 97.8 F (36.6 C)  98.2 F (36.8 C)  TempSrc: Oral Oral  Oral  SpO2: 100% 98%  97%  Weight:   59.7 kg (131 lb 9.8 oz)   Height:        Wt Readings from Last 3 Encounters:  06/27/16 59.7 kg (131 lb 9.8 oz)  06/13/16 62.6 kg (138 lb)  06/07/16 62.5 kg (137 lb 12.8 oz)     Intake/Output Summary (Last 24 hours) at 06/27/16 1641 Last data filed at 06/27/16 1305  Gross per 24 hour  Intake           323.75 ml  Output              470 ml  Net          -146.25 ml     Physical Exam  Gen: Thin built male not in distress HEENT: no pallor, moist mucosa, supple neck Chest: clear b/l, no added sounds CVS: N S1&S2, no murmurs, rubs or gallop GI: soft, NT, ND, BS+ Musculoskeletal: warm, no edema CNS: Alert and awake, answering questions appropriately, nonfocal    Data Review:    CBC  Recent Labs Lab 06/26/16 1950 06/26/16 1959 06/27/16 0032 06/27/16 0613  WBC 8.3  --   --  8.2  HGB 9.4* 9.2* 7.8* 8.7*  HCT 29.1* 27.0* 23.0* 27.4*  PLT 92*  --   --  74*  MCV 90.9  --   --  91.6  MCH 29.4  --   --  29.1  MCHC 32.3  --   --  31.8  RDW 16.5*  --   --  16.7*  LYMPHSABS 1.2  --   --   --  MONOABS 0.1  --   --   --   EOSABS 0.0  --   --   --   BASOSABS 0.0  --   --   --     Chemistries   Recent Labs Lab 06/26/16 1950 06/26/16 1959 06/27/16 0032  NA 140 140 140  K 6.0* 5.8* 4.1  CL 107 105 103  CO2 25  --   --   GLUCOSE 125* 117* 100*  BUN 42* 36* 31*  CREATININE 1.65* 1.50* 1.20  CALCIUM 8.9  --   --   AST 50*  --   --   ALT 80*  --   --   ALKPHOS 66  --   --   BILITOT 0.7  --   --    ------------------------------------------------------------------------------------------------------------------ No results for input(s): CHOL, HDL, LDLCALC, TRIG, CHOLHDL, LDLDIRECT in  the last 72 hours.  Lab Results  Component Value Date   HGBA1C 5.4 05/14/2016   ------------------------------------------------------------------------------------------------------------------ No results for input(s): TSH, T4TOTAL, T3FREE, THYROIDAB in the last 72 hours.  Invalid input(s): FREET3 ------------------------------------------------------------------------------------------------------------------ No results for input(s): VITAMINB12, FOLATE, FERRITIN, TIBC, IRON, RETICCTPCT in the last 72 hours.  Coagulation profile  Recent Labs Lab 06/27/16 0312  INR 1.32    No results for input(s): DDIMER in the last 72 hours.  Cardiac Enzymes  Recent Labs Lab 06/27/16 0022 06/27/16 0613 06/27/16 1203  TROPONINI 0.30* 0.34* 0.32*   ------------------------------------------------------------------------------------------------------------------    Component Value Date/Time   BNP 337.3 (H) 06/27/2016 0312    Inpatient Medications  Scheduled Meds: . atorvastatin  10 mg Oral q1800  . finasteride  5 mg Oral Daily  . fluconazole  100 mg Oral Daily  . FLUoxetine  20 mg Oral Daily  . folic acid  1 mg Oral Daily  . multivitamin with minerals  1 tablet Oral Daily  . omega-3 acid ethyl esters  1,000 mg Oral Daily  . pantoprazole  40 mg Oral Daily  . predniSONE  20 mg Oral Q breakfast  . terazosin  10 mg Oral QHS  . traZODone  50 mg Oral QHS  . vitamin C  1,000 mg Oral Daily   Continuous Infusions: . sodium chloride 100 mL/hr at 06/27/16 1309   PRN Meds:.morphine injection, nitroGLYCERIN, ondansetron, senna-docusate  Micro Results No results found for this or any previous visit (from the past 240 hour(s)).  Radiology Reports Ct Head Wo Contrast  Addendum Date: 06/26/2016   ADDENDUM REPORT: 06/26/2016 20:28 ADDENDUM: The original report was by Dr. Van Clines. The following addendum is by Dr. Van Clines: These results were called by telephone at the  time of interpretation on 06/26/2016 at 8:25 pm to Dr. Duffy Bruce , who verbally acknowledged these results. Electronically Signed   By: Van Clines M.D.   On: 06/26/2016 20:28   Result Date: 06/26/2016 CLINICAL DATA:  Altered mental status. Confusion over the past 2 days, progressive. MRI from 05/14/2016 showed a large acute right posterior cerebral artery distribution infarct and small acute infarcts in both lobes of the cerebellum, the left occipital lobe, and both frontal lobes (suggesting embolic phenomenon). Recent PET-CT of 05/25/2016 showed metastatic disease in both lungs. EXAM: CT HEAD WITHOUT CONTRAST TECHNIQUE: Contiguous axial images were obtained from the base of the skull through the vertex without intravenous contrast. COMPARISON:  05/14/2016 FINDINGS: Encephalomalacia associated with the prior right PCA distribution infarct noted. There is a new moderate-sized infarct in the right frontal lobe, with area of encephalomalacia measuring about 2.8  by 3.6 by 3.0 cm on image 15/2. Prior areas of cerebellar infarcts are not well seen on today' s CT but were well shown on prior MRI. No intracranial hemorrhage or obvious intracranial mass lesion. IMPRESSION: 1. New right frontal lobe infarct, probably 41-day-old or older (subacute), measuring up to 3.6 cm in long axis. This was not present on the prior brain MRI of 05/14/2016. No intracranial hemorrhage. 2. Evolutionary findings in the older dominant infarcts which were acute back on 05/14/2016, including a right PCA distribution infarct. The pattern of acute infarcts on the prior MRI suggested in embolic source. 3. Although the patient has bilateral pulmonary masses compatible with metastatic disease, I do not see definite metastatic disease to the brain on today's noncontrast CT. Electronically Signed: By: Van Clines M.D. On: 06/26/2016 20:20   US Biopsy  Result Date: 06/07/2016 INDICATION: Right supraclavicular adenopathy EXAM:  ULTRASOUND-GUIDED BIOPSY OF A RIGHT SUPRACLAVICULAR LYMPH NODE. CORE. MEDICATIONS: None. ANESTHESIA/SEDATION: Fentanyl 50 mcg IV; Versed 1 mg IV Moderate Sedation Time:  19 The patient was continuously monitored during the procedure by the interventional radiology nurse under my direct supervision. FLUOROSCOPY TIME:  Fluoroscopy Time:  minutes  seconds ( mGy). COMPLICATIONS: None immediate. PROCEDURE: Informed written consent was obtained from the patient after a thorough discussion of the procedural risks, benefits and alternatives. All questions were addressed. Maximal Sterile Barrier Technique was utilized including caps, mask, sterile gowns, sterile gloves, sterile drape, hand hygiene and skin antiseptic. A timeout was performed prior to the initiation of the procedure. The right neck was prepped with ChloraPrep in a sterile fashion, and a sterile drape was applied covering the operative field. A sterile gown and sterile gloves were used for the procedure. Under sonographic guidance, 3 18 gauge core biopsies of the right supraclavicular lymph node were obtained. Final imaging was performed. Patient tolerated the procedure well without complication. Vital sign monitoring by nursing staff during the procedure will continue as patient is in the special procedures unit for post procedure observation. FINDINGS: The images document guide needle placement within the enlarged right supraclavicular lymph node. Post biopsy images demonstrate no hemorrhage. IMPRESSION: Successful ultrasound-guided core biopsy of an enlarged right supraclavicular lymph node. Electronically Signed   By: Marybelle Killings M.D.   On: 06/07/2016 14:56    Time Spent in minutes  25   Louellen Molder M.D on 06/27/2016 at 4:41 PM  Between 7am to 7pm - Pager - [phone]  After 7pm go to [email or web address] - password [facility]  [facility] -  Office  [phone]

## 2016-06-27 NOTE — Progress Notes (Signed)
SLP Cancellation Note  Patient Details Name: Henry Ford MRN: [id number] DOB: [birth year]   Cancelled treatment:       Reason Eval/Treat Not Completed: Patient declined, no reason specified. Pt indicated "I'm fine, you can't fix it". RN notified. ST will retry tomorrow to assess pt willingness to participate.  Shonna Chock 06/27/2016, 1:15 PM  Jowel Waltner B. Quentin Ore [facility], [place] [phone]

## 2016-06-27 NOTE — Progress Notes (Signed)
Patient total urine output is 250 cc's charted since admission.  MD made aware.  Will continue to monitor closely.

## 2016-06-27 NOTE — Progress Notes (Signed)
OT Cancellation Note  Patient Details Name: Henry Ford MRN: [id number] DOB: 17-May-1935   Cancelled Treatment:    Reason Eval/Treat Not Completed: Medical issues which prohibited therapy -- Patient with troponins trending up as well as active bedrest order. Will defer OT evaluation today. Please update activity orders as indicated. OT will follow up at a later time.  Angelie Kram A 06/27/2016, 8:30 AM

## 2016-06-27 NOTE — Progress Notes (Signed)
[facility]  Patient Status: Active (receiving services up to time of hospitalization)  [facility] is providing the following services: RN and HHA  If patient discharges after hours, please call [phone].   Henry Ford 06/27/2016, 9:22 AM

## 2016-06-27 NOTE — Progress Notes (Signed)
SLP Cancellation Note  Patient Details Name: Emad Brechtel MRN: [id number] DOB: [birth year]   Cancelled treatment:       Reason Eval/Treat Not Completed: Patient at procedure or test/unavailable. Will continue efforts to complete SLE.  Shonna Chock 06/27/2016, 11:30 AM   Enriqueta Shutter. Quentin Ore [facility], [employer] [phone]

## 2016-06-27 NOTE — Progress Notes (Signed)
Echocardiogram 2D Echocardiogram has been performed.  Henry Ford 06/27/2016, 12:09 PM

## 2016-06-27 NOTE — Progress Notes (Signed)
PT Cancellation Note  Patient Details Name: Henry Ford MRN: [id number] DOB: Dec 04, 1934   Cancelled Treatment:    Reason Eval/Treat Not Completed: Medical issues which prohibited therapy. Pt currently on bedrest. Will perform PT eval once activity level status is updated. thanks.    Weston Anna, MPT Pager: [phone]

## 2016-06-27 NOTE — Consult Note (Signed)
Consultation Note Date: 06/27/2016   Patient Name: Henry Ford  DOB: [birth year]  MRN: [id number]  Age / Sex: 81 y.o., male  PCP: [name], DO Referring Physician: Louellen Molder, MD  Reason for Consultation: Establishing goals of care  HPI/Patient Profile: 81 y.o. male   admitted on 06/26/2016    Clinical Assessment and Goals of Care:  is a 81 y.o. male with medical history significant of hypertension, hyperlipidemia, GERD, depression, recent stroke on 05/14/16, lung cancer ( biopsy of neck lymph node on 06/07/16 showed metastatic moderately to poorly differentiated adenocarcinoma), GI bleeding, anemia, BPH, CKD-III, DVT and PE on Lovenox, who presented to the ED on 06-26-16 with altered mental status, agitation.    Workup in the emergency department showed elevated cardiac enzymes, slightly worsened renal function, brain imaging shows a new right frontal lobe infarct in addition to evolutionary findings in older dominant infarcts from his stroke in June 2017.    Patient has been seen and evaluated by neurology at the time of admission. Unfortunately, he has a new right frontal lobe subacute stroke as evidenced by CT scan of the head at the time of admission. He has had ongoing agitation and confusion at home. Final recommendations from neurology were to consider a palliative course given his overall health condition and for continuation of statins. It is noted that the patient passed a swallowing screen in the emergency department.  Hospital course has also been complicated by elevated troponin levels. Serial cycling of cardiac enzymes and repeat EKGs are being followed. Additionally, patient also likely in 3 renal azotemia, acute renal failure superimposed on his stage III chronic kidney disease.  Extensive chart review performed. The patient has lung cancer primary with metastasis from lung to other  sites-stage IV lung cancer. Biopsy of neck lymph node 7-20 showed metastatic moderate to poorly differentiated adenocarcinoma. Oncology records reviewed. Patient was given 1 dose of Alimta chemotherapy on 8-2. The plan was to see how the patient would tolerate getting chemotherapy. Reportedly, he was getting half the dose of regular chemotherapy. He was to undergo chemotherapy every 3 weeks or so and undergo restaging towards the end of September with a PET scan.  Unfortunately, for the past 2-3 days prior to his hospitalization, the patient has had ongoing confusion and agitation. His wife Henry Ford is his primary caregiver at home. A palliative care consultation has been requested for CODE STATUS, goals of care discussions values and wishes discussions given patient's serious underlying illness and new acute comorbidities.  Patient is an elderly gentleman resting in bed. He does not wish to discuss further. He replies in monosyllables. He does not appear to be in any acute distress. Call placed and discussed with wife Henry Ford at [phone] and she has arrived at the bedside this afternoon.  Family meeting held with wife this afternoon. I introduced myself and palliative care as follows: Palliative medicine is specialized medical care for people living with serious illness. It focuses on providing relief from the symptoms and stress of a serious illness.  The goal is to improve quality of life for both the patient and the family.  Brief life review performed. Patient's wife states that he has basically had ill health since December 2016. He has had ongoing debility especially since his stroke in 2017 June. Wife states that he was not sure whether or not to proceed with receiving first dose of Alimta. Wife states that several times last week the patient has been telling her, "just let me go". He has expressed his wishes to not undergo any further chemotherapy.  This is his second marriage for both Mr. and  Mrs. Henry Ford. They each have adult children from previous relationships. In particular, Mr. Henry Ford has 2 sons one lives in [place] and the other lives in [place]. Patient's wife has notified both of the patient's sons and it is reported that they may be arriving into town shortly. Patient lives in a large single-family home. He likes to be in his den in front of the television. Patient's wife thinks that there is enough room over there to place a hospital bed. Patient's wife states that based on his decline since the last few days, she had independently reached out to hosp[facility] of [place] earlier this week. She is accepting of hospice services but wishes to discuss with oncology further.   HCPOA  Wife Henry Ford at [phone]  SUMMARY OF RECOMMENDATIONS    DO NOT RESUSCITATE/DO NOT INTUBATE Continue current scope of hospitalization Patient and wife seriously thinking about not pursuing any further chemotherapy options. They would like to discuss with oncology further. Dr. Antonieta Pert input requested.  Code Status/Advance Care Planning:  DNR    Symptom Management:   continue current treatment.   Palliative Prophylaxis:   Bowel Regimen     Psycho-social/Spiritual:   Desire for further Chaplaincy support:no  Additional Recommendations: Education on Hospice  Prognosis:   < 3 months?  Discharge Planning: Home with Hospice?      Primary Diagnoses: Present on Admission: . Lung cancer, primary, with metastasis from lung to other site [facility]) . Thrombocytosis ([place]) . Acute renal failure superimposed on stage 3 chronic kidney disease ([place]) . Deep venous thrombosis ([place]) . Protein-calorie malnutrition ([place]) . Pulmonary embolism ([place]) . IDA (iron deficiency anemia) . Stroke ([place]) . Acute encephalopathy . Hyperkalemia . NSTEMI (non-ST elevated myocardial infarction) ([place])   I have reviewed the medical record, interviewed the patient and family, and examined the  patient. The following aspects are pertinent.  Past Medical History:  Diagnosis Date  . Anemia 12/2015  . Cancer ([place])    lung  . Colon polyps ~ 2001   unknown pathology, colonoscopy done at outside hospital.   . Gastrointestinal hemorrhage with melena    Status post EGD and blood transfusion March 2017   . Heme positive stool 12/2015  . Hypertension   . Ischemic foot ulcer due to atherosclerosis of native artery of limb ([place]) 01/10/2016  . Lung cancer, primary, with metastasis from lung to other site ([place]) 06/13/2016  . Peripheral vascular disease ([place]) 12/2015   non-healing ulcer right Hallux. S/P angioplasty and stenting of right common iliac 01/02/2016.  Marland Kitchen Prostate enlargement   . Right foot pain 12/30/2015   Admitted to [facility] 12/11/2015, discharge 12/14/2015, diagnoses included acute right great toe infection/cellulitis, peripheral vascular disease. ABI demonstrated arterial occlusive disease worse on left. Is referred outpatient to vascular The CT scan documents for copy of his discharge summary. He should follow up with Dr. Ladona Horns, general surgery at [facility]. No dated 02/06/2  . Stroke [facility]) May 2017   Social History   Social History  . Marital status: Married    Spouse name: Gwyndolyn Saxon  . Number of children: 2  . Years of education: N/A   Occupational History  . retired    Social History Main Topics  . Smoking status: Former Smoker    Packs/day: 0.25    Years: 60.00    Types: Cigarettes    Start date: 07/03/1950    Quit date: 01/20/2016  . Smokeless tobacco: Never Used  . Alcohol use 6.0 oz/week    10 Cans of beer per week     Comment: beer  . Drug use: No  . Sexual activity: Not Asked   Other Topics Concern  . None   Social History Narrative  . None   Family History  Problem Relation Age of Onset  . Diabetes Brother   . Colon cancer Neg Hx    Scheduled Meds: . atorvastatin  10 mg Oral q1800  . finasteride  5 mg Oral Daily  . fluconazole   100 mg Oral Daily  . FLUoxetine  20 mg Oral Daily  . folic acid  1 mg Oral Daily  . multivitamin with minerals  1 tablet Oral Daily  . omega-3 acid ethyl esters  1,000 mg Oral Daily  . pantoprazole  40 mg Oral Daily  . predniSONE  20 mg Oral Q breakfast  . terazosin  10 mg Oral QHS  . traZODone  50 mg Oral QHS  . vitamin C  1,000 mg Oral Daily   Continuous Infusions: . sodium chloride 100 mL/hr at 06/27/16 1309   PRN Meds:.morphine injection, nitroGLYCERIN, ondansetron, senna-docusate Medications Prior to Admission:  Prior to Admission medications   Medication Sig Start Date End Date Taking? Authorizing Provider  atorvastatin (LIPITOR) 10 MG tablet Take 1 tablet (10 mg total) by mouth daily at 6 PM. 05/21/16  Yes [name], DO  co-enzyme Q-10 30 MG capsule Take 30 mg by mouth daily.   Yes Historical Provider, MD  enoxaparin (LOVENOX) 120 MG/0.8ML injection Inject 0.71 mLs (106 mg total) into the skin daily. Take daily at 6 pm starting 05/16/2016. 06/14/16  Yes Eliezer Bottom, NP  finasteride (PROSCAR) 5 MG tablet Take 5 mg by mouth daily. Reported on 02/01/2016   Yes Historical Provider, MD  fluconazole (DIFLUCAN) 100 MG tablet Take 1 tablet (100 mg total) by mouth daily. 05/30/16  Yes Volanda Napoleon, MD  FLUoxetine (PROZAC) 20 MG tablet Take 1 tablet (20 mg total) by mouth daily. 05/24/16  Yes [name], DO  folic acid (FOLVITE) 1 MG tablet Take 1 tablet (1 mg total) by mouth daily. Start 5-7 days before Alimta chemotherapy. Continue until 21 days after Alimta completed. 06/20/16  Yes Volanda Napoleon, MD  Multiple Vitamin (MULTIVITAMIN) tablet Take 1 tablet by mouth daily.   Yes Historical Provider, MD  Omega 3 1000 MG CAPS Take 1,000 mg by mouth daily.   Yes Historical Provider, MD  ondansetron (ZOFRAN) 8 MG tablet Take 1 tablet (8 mg total) by mouth 2 (two) times daily as needed (Nausea or vomiting). 06/20/16  Yes Volanda Napoleon, MD  pantoprazole (PROTONIX) 40 MG tablet Take  40 mg by mouth daily.  04/18/16  Yes Historical Provider, MD  predniSONE (DELTASONE) 20 MG tablet Take 1 tablet (20 mg total) by mouth daily with breakfast. 05/30/16  Yes Volanda Napoleon, MD  Red Yeast Rice 600 MG  CAPS Take 600 mg by mouth daily.   Yes Historical Provider, MD  terazosin (HYTRIN) 10 MG capsule Take 10 mg by mouth at bedtime. Reported on 01/20/2016   Yes Historical Provider, MD  traZODone (DESYREL) 50 MG tablet Take 1 tablet (50 mg total) by mouth at bedtime. 06/13/16  Yes Volanda Napoleon, MD  vitamin C (ASCORBIC ACID) 500 MG tablet Take 1,000 mg by mouth daily.   Yes Historical Provider, MD  ciprofloxacin (CIPRO) 500 MG tablet Take 1 tablet (500 mg total) by mouth 2 (two) times daily. 06/27/16   [name], DO   Allergies  Allergen Reactions  . Aspirin Other (See Comments)    Acid reflux   Review of Systems + for  confusion.   Physical Exam Weak frail elderly gentleman S1 S2 Clear diminished bases Abdomen soft No edema Muscle wasting Awake alert but confused  Vital Signs: BP (!) 133/41 (BP Location: Right Arm)   Pulse 60   Temp 98.2 F (36.8 C) (Oral)   Resp 16   Ht '5\' 9"'$  (1.753 m)   Wt 59.7 kg (131 lb 9.8 oz)   SpO2 97%   BMI 19.44 kg/m  Pain Assessment: No/denies pain   Pain Score: 0-No pain   SpO2: SpO2: 97 % O2 Device:SpO2: 97 % O2 Flow Rate: .   IO: Intake/output summary:  Intake/Output Summary (Last 24 hours) at 06/27/16 1503 Last data filed at 06/27/16 1305  Gross per 24 hour  Intake           323.75 ml  Output              470 ml  Net          -146.25 ml    LBM: Last BM Date: 06/26/16 Baseline Weight: Weight: 56.3 kg (124 lb 1.9 oz) Most recent weight: Weight: 59.7 kg (131 lb 9.8 oz)     Palliative Assessment/Data:   Flowsheet Rows   Flowsheet Row Most Recent Value  Intake Tab  Referral Department  Hospitalist  Unit at Time of Referral  Oncology Unit  Palliative Care Primary Diagnosis  Cancer  Date Notified  06/27/16    Palliative Care Type  New Palliative care  Reason for referral  Clarify Goals of Care  Date of Admission  06/26/16  Date first seen by Palliative Care  06/27/16  # of days IP prior to Palliative referral  1  Clinical Assessment  Palliative Performance Scale Score  30%  Pain Max last 24 hours  6  Pain Min Last 24 hours  5  Dyspnea Max Last 24 Hours  6  Dyspnea Min Last 24 hours  5  Nausea Max Last 24 Hours  5  Nausea Min Last 24 Hours  4  Psychosocial & Spiritual Assessment  Palliative Care Outcomes  Patient/Family meeting held?  Yes  Who was at the meeting?  patient, wife   Palliative Care follow-up planned  Yes, Home      Time In:  1400 Time Out:  1520 Time Total:  80 min  Greater than 50%  of this time was spent counseling and coordinating care related to the above assessment and plan.  Signed by: Loistine Chance, MD  [phone]  Please contact Palliative Medicine Team phone at [phone] for questions and concerns.  For individual provider: See Shea Evans

## 2016-06-28 ENCOUNTER — Encounter: Payer: Self-pay | Admitting: Nurse Practitioner

## 2016-06-28 DIAGNOSIS — I82402 Acute embolism and thrombosis of unspecified deep veins of left lower extremity: Secondary | ICD-10-CM

## 2016-06-28 DIAGNOSIS — D6859 Other primary thrombophilia: Secondary | ICD-10-CM

## 2016-06-28 DIAGNOSIS — C3490 Malignant neoplasm of unspecified part of unspecified bronchus or lung: Secondary | ICD-10-CM

## 2016-06-28 DIAGNOSIS — I639 Cerebral infarction, unspecified: Secondary | ICD-10-CM

## 2016-06-28 DIAGNOSIS — E46 Unspecified protein-calorie malnutrition: Secondary | ICD-10-CM

## 2016-06-28 DIAGNOSIS — I82401 Acute embolism and thrombosis of unspecified deep veins of right lower extremity: Secondary | ICD-10-CM

## 2016-06-28 LAB — BASIC METABOLIC PANEL
Anion gap: 5 (ref 5–15)
BUN: 19 mg/dL (ref 6–20)
CHLORIDE: 108 mmol/L (ref 101–111)
CO2: 27 mmol/L (ref 22–32)
Calcium: 7.8 mg/dL — ABNORMAL LOW (ref 8.9–10.3)
Creatinine, Ser: 1.21 mg/dL (ref 0.61–1.24)
GFR calc non Af Amer: 56 mL/min — ABNORMAL LOW (ref >60)
Glucose, Bld: 80 mg/dL (ref 65–99)
POTASSIUM: 3.4 mmol/L — AB (ref 3.5–5.1)
SODIUM: 140 mmol/L (ref 135–145)

## 2016-06-28 LAB — CBC
HCT: 24.9 % — ABNORMAL LOW (ref 39.0–52.0)
HEMOGLOBIN: 8.2 g/dL — AB (ref 13.0–17.0)
MCH: 29.6 pg (ref 26.0–34.0)
MCHC: 32.9 g/dL (ref 30.0–36.0)
MCV: 89.9 fL (ref 78.0–100.0)
Platelets: 60 10*3/uL — ABNORMAL LOW (ref 150–400)
RBC: 2.77 MIL/uL — AB (ref 4.22–5.81)
RDW: 16.2 % — ABNORMAL HIGH (ref 11.5–15.5)
WBC: 4.3 10*3/uL (ref 4.0–10.5)

## 2016-06-28 LAB — HEPARIN INDUCED PLATELET AB (HIT ANTIBODY): HEPARIN INDUCED PLT AB: 0.282 {OD_unit} (ref 0.000–0.400)

## 2016-06-28 MED ORDER — PREDNISONE 5 MG PO TABS
10.0000 mg | ORAL_TABLET | Freq: Every day | ORAL | Status: DC
  Administered 2016-06-29: 10 mg via ORAL
  Filled 2016-06-28: qty 2

## 2016-06-28 NOTE — Progress Notes (Signed)
Nutrition Brief Note  RD consulted for nutritional assessment. Per MD note 8/10, "I think our goal at this point is strictly comfort care."   Chart reviewed. Pt transitioning to comfort care.  No nutrition interventions warranted at this time.  Please re-consult as needed.   Clayton Bibles, MS, RD, LDN Pager: [phone] After Hours Pager: [phone]

## 2016-06-28 NOTE — Progress Notes (Signed)
CSW received consult for residential hospice placement. CSW met with patient's wife, [name] at bedside who informed CSW that she called [facility] earlier in the week to inquire about their hospice home & is agreeable with CSW making referral. CSW called Jenny Reichmann at [facility] sent referral - will follow-up in the morning re: bed availability/eligibility.    Raynaldo Opitz, [facility] Clinical Social Worker cell #: [phone]

## 2016-06-28 NOTE — Progress Notes (Signed)
PROGRESS NOTE                                                                                                                                                                                                             Patient Demographics:    Henry Ford, is a 81 y.o. male, DOB - Jan 11, 1935, WNI:[id number]  Admit date - 06/26/2016   Admitting Physician Ivor Costa, MD  Outpatient Primary MD for the patient is [name], DO  [facility]   Chief Complaint  Patient presents with  . Altered Mental Status       Brief Narrative   81 year old male with history of hypertension, hyperlipidemia, recent stroke in June 2017, lung cancer With biopsy on 7/20 showing metastatic moderately to poorly differentiated adenocarcinoma), GI bleed, anemia, BPH, CK D stage III, DVT and PE on Lovenox brought to the ED with agitation and altered mental status. In the ED he was found to have a lactate of 2. troponin of 0.13 which further elevated to 0.3, BNP of 337 and potassium of 5.8. He'll function mildly worsened. CT head showed new right frontal lobe infarct (subacute), without intracranial hemorrhage.  Admitted to telemetry for further management.   Subjective:   Appears better oriented today.   Assessment  & Plan :    Principal Problem:   Stroke [facility]) Suspect embolic. Agitation and confusion likely related to new right frontal lobe infarct. Now resolved. Neuro hospitalist Dr. Nicole Kindred was consulted by ED physician who recommended no further workup since his recent aggressive workup done. Given overall decline in his health condition palliative care was consulted. Seen by his oncologist and recommend that he has very poor overall prognosis with poor performance status and recommends hospice. Palliative care team spoke with his wife who wanted to discuss and finalize plan with Dr. Marin Olp. I will consult social  work for residential hospice placement.   Active Problems: Thrombocytopenia ([place]) Patient was started on Lovenox after his stroke in June and recently diagnosed bilateral DVT and PE (decided to switch him to Lovenox as patient was already on eliquis) Significant drop in platelets likely due to chemotherapy. Now off anticoagulation with plan on hospice for comfort.     Acute renal failure superimposed on stage 3 chronic kidney disease (HCC)  gentle hydration.  Elevated troponin/ ? NSTEMI  Denies chest pain symptoms. However 2-D echo with decreased EF of 45-50% with diffuse hypokinesis. This is new from his echo 2 months back. Grade 1 diastolic dysfunction. Currently euvolemic. Added low dose beta blocker.    Deep venous thrombosis (HCC) and PE DC anticoagulation.    Protein-calorie malnutrition ([name]) Comfort feeds.     Lung cancer, primary, with metastasis from lung to other site [facility]) Poor functional status and not a candidate for further chemotherapy. Oncologist recommends hospice.    Hyperkalemia Resolved. Stable on telemetry.         Code Status : DO NOT RESUSCITATE  Family Communication  :  None at bedside.  Disposition Plan  :  Pending PT evaluation  Barriers For Discharge :  Active symptoms  Consults  :   Palliative care  Procedures  : head CT  DVT Prophylaxis  :  SCDs  Lab Results  Component Value Date   PLT 60 (L) 06/28/2016    Antibiotics  :    Anti-infectives    Start     Dose/Rate Route Frequency Ordered Stop   06/27/16 1000  fluconazole (DIFLUCAN) tablet 100 mg  Status:  Discontinued     100 mg Oral Daily 06/26/16 2315 06/28/16 0736        Objective:   Vitals:   06/27/16 2020 06/27/16 2102 06/28/16 0449 06/28/16 0930  BP: (!) 163/50  (!) 158/58 (!) 155/60  Pulse: 68 (!) 127 62 60  Resp: 16  16   Temp: 98.1 F (36.7 C)  98.5 F (36.9 C)   TempSrc: Oral  Oral   SpO2: 94%  93%   Weight:      Height:        Wt Readings from  Last 3 Encounters:  06/27/16 59.7 kg (131 lb 9.8 oz)  06/13/16 62.6 kg (138 lb)  06/07/16 62.5 kg (137 lb 12.8 oz)     Intake/Output Summary (Last 24 hours) at 06/28/16 1441 Last data filed at 06/28/16 0958  Gross per 24 hour  Intake             1020 ml  Output             1150 ml  Net             -130 ml     Physical Exam  Gen:Not in distress HEENT: , moist mucosa, supple neck Chest: clear b/l, no added sounds CVS: N S1&S2, no murmurs, rubs or gallop GI: soft, NT, ND, BS+ Musculoskeletal: warm, no edema CNS: Alert and oriented    Data Review:    CBC  Recent Labs Lab 06/26/16 1950 06/26/16 1959 06/27/16 0032 06/27/16 0613 06/28/16 0429  WBC 8.3  --   --  8.2 4.3  HGB 9.4* 9.2* 7.8* 8.7* 8.2*  HCT 29.1* 27.0* 23.0* 27.4* 24.9*  PLT 92*  --   --  74* 60*  MCV 90.9  --   --  91.6 89.9  MCH 29.4  --   --  29.1 29.6  MCHC 32.3  --   --  31.8 32.9  RDW 16.5*  --   --  16.7* 16.2*  LYMPHSABS 1.2  --   --   --   --   MONOABS 0.1  --   --   --   --   EOSABS 0.0  --   --   --   --   BASOSABS 0.0  --   --   --   --  Chemistries   Recent Labs Lab 06/26/16 1950 06/26/16 1959 06/27/16 0032 06/28/16 0429  NA 140 140 140 140  K 6.0* 5.8* 4.1 3.4*  CL 107 105 103 108  CO2 25  --   --  27  GLUCOSE 125* 117* 100* 80  BUN 42* 36* 31* 19  CREATININE 1.65* 1.50* 1.20 1.21  CALCIUM 8.9  --   --  7.8*  AST 50*  --   --   --   ALT 80*  --   --   --   ALKPHOS 66  --   --   --   BILITOT 0.7  --   --   --    ------------------------------------------------------------------------------------------------------------------ No results for input(s): CHOL, HDL, LDLCALC, TRIG, CHOLHDL, LDLDIRECT in the last 72 hours.  Lab Results  Component Value Date   HGBA1C 5.4 05/14/2016   ------------------------------------------------------------------------------------------------------------------ No results for input(s): TSH, T4TOTAL, T3FREE, THYROIDAB in the last 72  hours.  Invalid input(s): FREET3 ------------------------------------------------------------------------------------------------------------------ No results for input(s): VITAMINB12, FOLATE, FERRITIN, TIBC, IRON, RETICCTPCT in the last 72 hours.  Coagulation profile  Recent Labs Lab 06/27/16 0312  INR 1.32    No results for input(s): DDIMER in the last 72 hours.  Cardiac Enzymes  Recent Labs Lab 06/27/16 0022 06/27/16 0613 06/27/16 1203  TROPONINI 0.30* 0.34* 0.32*   ------------------------------------------------------------------------------------------------------------------    Component Value Date/Time   BNP 337.3 (H) 06/27/2016 0312    Inpatient Medications  Scheduled Meds: . enoxaparin (LOVENOX) injection  1 mg/kg Subcutaneous Q12H  . finasteride  5 mg Oral Daily  . FLUoxetine  20 mg Oral Daily  . metoprolol tartrate  12.5 mg Oral BID  . multivitamin with minerals  1 tablet Oral Daily  . omega-3 acid ethyl esters  1,000 mg Oral Daily  . pantoprazole  40 mg Oral Daily  . [START ON 06/29/2016] predniSONE  10 mg Oral Q breakfast  . terazosin  10 mg Oral QHS  . traZODone  50 mg Oral QHS  . vitamin C  1,000 mg Oral Daily   Continuous Infusions: . sodium chloride 100 mL/hr at 06/27/16 2201   PRN Meds:.morphine injection, nitroGLYCERIN, ondansetron, senna-docusate  Micro Results No results found for this or any previous visit (from the past 240 hour(s)).  Radiology Reports Ct Head Wo Contrast  Addendum Date: 06/26/2016   ADDENDUM REPORT: 06/26/2016 20:28 ADDENDUM: The original report was by Dr. Van Clines. The following addendum is by Dr. Van Clines: These results were called by telephone at the time of interpretation on 06/26/2016 at 8:25 pm to Dr. Duffy Bruce , who verbally acknowledged these results. Electronically Signed   By: Van Clines M.D.   On: 06/26/2016 20:28   Result Date: 06/26/2016 CLINICAL DATA:  Altered mental status.  Confusion over the past 2 days, progressive. MRI from 05/14/2016 showed a large acute right posterior cerebral artery distribution infarct and small acute infarcts in both lobes of the cerebellum, the left occipital lobe, and both frontal lobes (suggesting embolic phenomenon). Recent PET-CT of 05/25/2016 showed metastatic disease in both lungs. EXAM: CT HEAD WITHOUT CONTRAST TECHNIQUE: Contiguous axial images were obtained from the base of the skull through the vertex without intravenous contrast. COMPARISON:  05/14/2016 FINDINGS: Encephalomalacia associated with the prior right PCA distribution infarct noted. There is a new moderate-sized infarct in the right frontal lobe, with area of encephalomalacia measuring about 2.8 by 3.6 by 3.0 cm on image 15/2. Prior areas of cerebellar infarcts are not well seen on today'  s CT but were well shown on prior MRI. No intracranial hemorrhage or obvious intracranial mass lesion. IMPRESSION: 1. New right frontal lobe infarct, probably 16-day-old or older (subacute), measuring up to 3.6 cm in long axis. This was not present on the prior brain MRI of 05/14/2016. No intracranial hemorrhage. 2. Evolutionary findings in the older dominant infarcts which were acute back on 05/14/2016, including a right PCA distribution infarct. The pattern of acute infarcts on the prior MRI suggested in embolic source. 3. Although the patient has bilateral pulmonary masses compatible with metastatic disease, I do not see definite metastatic disease to the brain on today's noncontrast CT. Electronically Signed: By: Van Clines M.D. On: 06/26/2016 20:20   US Biopsy  Result Date: 06/07/2016 INDICATION: Right supraclavicular adenopathy EXAM: ULTRASOUND-GUIDED BIOPSY OF A RIGHT SUPRACLAVICULAR LYMPH NODE. CORE. MEDICATIONS: None. ANESTHESIA/SEDATION: Fentanyl 50 mcg IV; Versed 1 mg IV Moderate Sedation Time:  19 The patient was continuously monitored during the procedure by the interventional  radiology nurse under my direct supervision. FLUOROSCOPY TIME:  Fluoroscopy Time:  minutes  seconds ( mGy). COMPLICATIONS: None immediate. PROCEDURE: Informed written consent was obtained from the patient after a thorough discussion of the procedural risks, benefits and alternatives. All questions were addressed. Maximal Sterile Barrier Technique was utilized including caps, mask, sterile gowns, sterile gloves, sterile drape, hand hygiene and skin antiseptic. A timeout was performed prior to the initiation of the procedure. The right neck was prepped with ChloraPrep in a sterile fashion, and a sterile drape was applied covering the operative field. A sterile gown and sterile gloves were used for the procedure. Under sonographic guidance, 3 18 gauge core biopsies of the right supraclavicular lymph node were obtained. Final imaging was performed. Patient tolerated the procedure well without complication. Vital sign monitoring by nursing staff during the procedure will continue as patient is in the special procedures unit for post procedure observation. FINDINGS: The images document guide needle placement within the enlarged right supraclavicular lymph node. Post biopsy images demonstrate no hemorrhage. IMPRESSION: Successful ultrasound-guided core biopsy of an enlarged right supraclavicular lymph node. Electronically Signed   By: Marybelle Killings M.D.   On: 06/07/2016 14:56    Time Spent in minutes  25   Louellen Molder M.D on 06/28/2016 at 2:41 PM  Between 7am to 7pm - Pager - [phone]  After 7pm go to [email or web address] - password [facility]  [facility] -  Office  [phone]

## 2016-06-28 NOTE — Consult Note (Signed)
Referral MD  Reason for Referral: Metastatic adenocarcinoma of the lung with recurrent CVA-hypercoagulability   Chief Complaint  Patient presents with  . Altered Mental Status  : Patient cannot give any history.  HPI: Mr. mcgillicuddy is a very nice 81 year old white male. He is well-known to me. He has a very difficult problem with a metastatic poorly differentiated adenocarcinoma of the lung. He and his soups with significant hypercoagulability with thromboembolic disease in his legs and his chest.  We put him on Lovenox. He's been on Lovenox. He also has had a past CVA.  His performance status has an marginal. He did want to try treatment. We gave him single agent Alimta on August 2. This was dose reduced. He seemed to tolerate this well initially. However, he then began to have issues with good perfusion. He declined in his overall status. He 5 had to come to the hospital. He unfortunately had another CVA. Again this is while he is on Lovenox. The CT of the brain showed a new right frontal lobe infarct. He had evolutionary changes in his other infarcts. There is no metastatic disease to the brain.  It is painfully obvious that he is not going to do well at all. The hypercoagulability from his malignancy is really his biggest issue. Again despite being on therapeutic anticoagulation, he still has had embolic disease to his brain.  His performance status is very poor. I say his performance status now is ECOG 4. He definitely is not a candidate for any additional therapy.  His CBC shows that his white cell count 4.3. Hemoglobin 8.2. Platelet count 60,000. I think this is from his Alimta. Even though he had dose reduction of his Alimta, his tolerability is exceedingly poor. I know that he has tried but his body just is not able to tolerate any stress.  I try to wake him up this morning. He just was very lethargic.      Past Medical History:  Diagnosis Date  . Anemia 12/2015  . Cancer ([place])     lung  . Colon polyps ~ 2001   unknown pathology, colonoscopy done at outside hospital.   . Gastrointestinal hemorrhage with melena    Status post EGD and blood transfusion March 2017   . Heme positive stool 12/2015  . Hypertension   . Ischemic foot ulcer due to atherosclerosis of native artery of limb ([place]) 01/10/2016  . Lung cancer, primary, with metastasis from lung to other site ([place]) 06/13/2016  . Peripheral vascular disease ([place]) 12/2015   non-healing ulcer right Hallux. S/P angioplasty and stenting of right common iliac 01/02/2016.  Marland Kitchen Prostate enlargement   . Right foot pain 12/30/2015   Admitted to [facility] 12/11/2015, discharge 12/14/2015, diagnoses included acute right great toe infection/cellulitis, peripheral vascular disease. ABI demonstrated arterial occlusive disease worse on left. Is referred outpatient to vascular The CT scan documents for copy of his discharge summary. He should follow up with Dr. Ladona Horns, general surgery at [facility]. No dated 02/06/2  . Stroke [facility]) May 2017  :  Past Surgical History:  Procedure Laterality Date  . ESOPHAGOGASTRODUODENOSCOPY N/A 01/21/2016   Procedure: ESOPHAGOGASTRODUODENOSCOPY (EGD);  Surgeon: Irene Shipper, MD;  Location: [facility];  Service: Endoscopy;  Laterality: N/A;  . HYDROCELE EXCISION  1956  . PERIPHERAL VASCULAR CATHETERIZATION N/A 01/02/2016   Procedure: Abdominal Aortogram w/Lower Extremity;  Surgeon: Angelia Mould, MD;  Location: [place] CV [facility];  Service: Cardiovascular;  Laterality: N/A;  . PERIPHERAL VASCULAR  CATHETERIZATION  01/02/2016   Procedure: Peripheral Vascular Intervention;  Surgeon: Angelia Mould, MD;  Location: [place] CV LAB;  Service: Cardiovascular;;  :   Current Facility-Administered Medications:  .  0.9 %  sodium chloride infusion, , Intravenous, Continuous, Ivor Costa, MD, Last Rate: 100 mL/hr at 06/27/16 2201 .  atorvastatin (LIPITOR) tablet 10 mg, 10 mg, Oral,  q1800, Ivor Costa, MD, 10 mg at 06/27/16 1738 .  enoxaparin (LOVENOX) injection 60 mg, 1 mg/kg, Subcutaneous, Q12H, Nishant Dhungel, MD, 60 mg at 06/27/16 2158 .  finasteride (PROSCAR) tablet 5 mg, 5 mg, Oral, Daily, Ivor Costa, MD, 5 mg at 06/27/16 1114 .  fluconazole (DIFLUCAN) tablet 100 mg, 100 mg, Oral, Daily, Ivor Costa, MD, 100 mg at 06/27/16 1113 .  FLUoxetine (PROZAC) capsule 20 mg, 20 mg, Oral, Daily, Ivor Costa, MD, 20 mg at 06/27/16 1114 .  folic acid (FOLVITE) tablet 1 mg, 1 mg, Oral, Daily, Ivor Costa, MD, 1 mg at 06/27/16 1114 .  metoprolol tartrate (LOPRESSOR) tablet 12.5 mg, 12.5 mg, Oral, BID, Nishant Dhungel, MD, 12.5 mg at 06/27/16 2102 .  morphine 2 MG/ML injection 1 mg, 1 mg, Intravenous, Q4H PRN, Ivor Costa, MD .  multivitamin with minerals tablet 1 tablet, 1 tablet, Oral, Daily, Ivor Costa, MD, 1 tablet at 06/27/16 1114 .  nitroGLYCERIN (NITROSTAT) SL tablet 0.4 mg, 0.4 mg, Sublingual, Q5 min PRN, Ivor Costa, MD .  omega-3 acid ethyl esters (LOVAZA) capsule 1,000 mg, 1,000 mg, Oral, Daily, Ivor Costa, MD, 1,000 mg at 06/27/16 1114 .  ondansetron (ZOFRAN) injection 4 mg, 4 mg, Intravenous, Q8H PRN, Ivor Costa, MD .  pantoprazole (PROTONIX) EC tablet 40 mg, 40 mg, Oral, Daily, Ivor Costa, MD, 40 mg at 06/27/16 1114 .  predniSONE (DELTASONE) tablet 20 mg, 20 mg, Oral, Q breakfast, Ivor Costa, MD, 20 mg at 06/27/16 0935 .  senna-docusate (Senokot-S) tablet 1 tablet, 1 tablet, Oral, QHS PRN, Ivor Costa, MD .  terazosin (HYTRIN) capsule 10 mg, 10 mg, Oral, QHS, Ivor Costa, MD, 10 mg at 06/27/16 2157 .  traZODone (DESYREL) tablet 50 mg, 50 mg, Oral, QHS, Ivor Costa, MD, 50 mg at 06/27/16 2157 .  vitamin C (ASCORBIC ACID) tablet 1,000 mg, 1,000 mg, Oral, Daily, Ivor Costa, MD, 1,000 mg at 06/27/16 1114:  . atorvastatin  10 mg Oral q1800  . enoxaparin (LOVENOX) injection  1 mg/kg Subcutaneous Q12H  . finasteride  5 mg Oral Daily  . fluconazole  100 mg Oral Daily  . FLUoxetine  20 mg Oral Daily   . folic acid  1 mg Oral Daily  . metoprolol tartrate  12.5 mg Oral BID  . multivitamin with minerals  1 tablet Oral Daily  . omega-3 acid ethyl esters  1,000 mg Oral Daily  . pantoprazole  40 mg Oral Daily  . predniSONE  20 mg Oral Q breakfast  . terazosin  10 mg Oral QHS  . traZODone  50 mg Oral QHS  . vitamin C  1,000 mg Oral Daily  :  Allergies  Allergen Reactions  . Aspirin Other (See Comments)    Acid reflux  :  Family History  Problem Relation Age of Onset  . Diabetes Brother   . Colon cancer Neg Hx   :  Social History   Social History  . Marital status: Married    Spouse name: Gwyndolyn Saxon  . Number of children: 2  . Years of education: N/A   Occupational History  . retired  Social History Main Topics  . Smoking status: Former Smoker    Packs/day: 0.25    Years: 60.00    Types: Cigarettes    Start date: 07/03/1950    Quit date: 01/20/2016  . Smokeless tobacco: Never Used  . Alcohol use 6.0 oz/week    10 Cans of beer per week     Comment: beer  . Drug use: No  . Sexual activity: Not on file   Other Topics Concern  . Not on file   Social History Narrative  . No narrative on file  :  Pertinent items are noted in HPI.  Exam: Patient Vitals for the past 24 hrs:  BP Temp Temp src Pulse Resp SpO2 Weight  06/28/16 0449 (!) 158/58 98.5 F (36.9 C) Oral 62 16 93 % -  06/27/16 2102 - - - (!) 127 - - -  06/27/16 2020 (!) 163/50 98.1 F (36.7 C) Oral 68 16 94 % -  06/27/16 1258 (!) 133/41 98.2 F (36.8 C) Oral 60 16 97 % -  06/27/16 1100 - - - - - - 131 lb 9.8 oz (59.7 kg)  06/27/16 0854 134/78 97.8 F (36.6 C) Oral 60 16 98 % -    As above    Recent Labs  06/27/16 0613 06/28/16 0429  WBC 8.2 4.3  HGB 8.7* 8.2*  HCT 27.4* 24.9*  PLT 74* 60*    Recent Labs  06/26/16 1950  06/27/16 0032 06/28/16 0429  NA 140  < > 140 140  K 6.0*  < > 4.1 3.4*  CL 107  < > 103 108  CO2 25  --   --  27  GLUCOSE 125*  < > 100* 80  BUN 42*  < > 31* 19   CREATININE 1.65*  < > 1.20 1.21  CALCIUM 8.9  --   --  7.8*  < > = values in this interval not displayed.  Blood smear review:  None  Pathology: None     Assessment and Plan:  Mr. Valley is a 81 year old white male. He has metastatic poorly differentiated adenocarcinoma of the lung. His tumor is negative for any of the genetic drivers. He has severe hypercoagulability. Again, despite being on therapeutic anticoagulation, he still has had another thrombotic event.  I think our goal at this point is strictly comfort care. Palliation of his symptoms would be desirable. He has already seen palliative care. I very much appreciate their input.  I think that hospice is definitely indicated now. As hard as he would like to try to take treatment, I just don't think that he will be able to.  For now, I would continue comfort care measures for him. I probably would try to narrow down some of his medications.  I am not sure we need to really continue him on blood thinner. I was not sure how much this really is going to help [place] out.  I would have to think that given his overall situation, his prognosis will be less than 6 weeks. I think that he just is so "beaten down" by this malignancy, on top of having the hypercoagulability.  I'll have to talk to his wife. I'll try to talk to her today.  I will continue to follow along.  I think everybody up on 4 W. for their outstanding care.  Lattie Haw, MD  Jeneen Rinks 1:5-7

## 2016-06-28 NOTE — Progress Notes (Signed)
Chaplain following in response to RN referral.  End of Life.      When arrived, no family in room with Mr Artola.  Will follow up to make contact with family and assess for support needs.      [place], [place]

## 2016-06-28 NOTE — Progress Notes (Signed)
OT Cancellation Note  Patient Details Name: Henry Ford MRN: [id number] DOB: 11/08/1935   Cancelled Treatment:    Reason Eval/Treat Not Completed: Medical issues which prohibited therapy -- patient continues to have bedrest order. Will follow up for OT evaluation at a later time.  Marwa Fuhrman A 06/28/2016, 7:31 AM

## 2016-06-28 NOTE — Progress Notes (Signed)
PT Cancellation Note  Patient Details Name: Henry Ford MRN: [id number] DOB: [birth year]   Cancelled Treatment:    Reason Eval/Treat Not Completed: PT screened, no needs identified, will sign off Per palliative medicine, pt "appears appropriate for residential hospice and full comfort measures."  No family in room and pt confused this morning per RN.  PT to sign off at this time however please re-order (and update activity level - currently on bedrest) if physical therapy evaluation would be beneficial.  Thank you.   Henry Ford,KATHrine E 06/28/2016, 12:14 PM Carmelia Bake, PT, DPT 06/28/2016 Pager: [phone]

## 2016-06-28 NOTE — Progress Notes (Signed)
SLP Cancellation Note  Patient Details Name: Henry Ford MRN: [id number] DOB: [birth year]   Cancelled treatment:       Reason Eval/Treat Not Completed: Other (comment). Pt is strictly comfort care at this time. No further SLP interventions needed. Will sign off.    Martina Brodbeck, Katherene Ponto 06/28/2016, 1:19 PM

## 2016-06-28 NOTE — Progress Notes (Signed)
Daily Progress Note   Patient Name: Henry Ford       Date: 06/28/2016 DOB: [birth year]  Age: 81 y.o. MRN#: [id number] Attending Physician: Louellen Molder, MD Primary Care Physician: [name], DO Admit Date: 06/26/2016  Reason for Consultation/Follow-up: Establishing goals of care   Life limiting illness: metastatic poorly differentiated adenocarcinoma of the lung, now admitted with recurrent strokes, low platelets, declining functional status, unable to tolerate chemo.   Subjective: Appears weaker than yesterday, opens eyes when name is called but other wise is not able to interact much.   No family present at the bedside, see below.    Length of Stay: 1  Current Medications: Scheduled Meds:  . enoxaparin (LOVENOX) injection  1 mg/kg Subcutaneous Q12H  . finasteride  5 mg Oral Daily  . FLUoxetine  20 mg Oral Daily  . metoprolol tartrate  12.5 mg Oral BID  . multivitamin with minerals  1 tablet Oral Daily  . omega-3 acid ethyl esters  1,000 mg Oral Daily  . pantoprazole  40 mg Oral Daily  . [START ON 06/29/2016] predniSONE  10 mg Oral Q breakfast  . terazosin  10 mg Oral QHS  . traZODone  50 mg Oral QHS  . vitamin C  1,000 mg Oral Daily    Continuous Infusions: . sodium chloride 100 mL/hr at 06/27/16 2201    PRN Meds: morphine injection, nitroGLYCERIN, ondansetron, senna-docusate  Physical Exam         Weak frail appearing elderly gentleman No distress Appears chronically ill No edema Muscle wasting evident Shallow regular breathing pattern noted.   Vital Signs: BP (!) 158/58 (BP Location: Right Arm)   Pulse 62   Temp 98.5 F (36.9 C) (Oral)   Resp 16   Ht '5\' 9"'$  (1.753 m)   Wt 59.7 kg (131 lb 9.8 oz)   SpO2 93%   BMI 19.44 kg/m  SpO2: SpO2: 93 % O2  Device: O2 Device: Not Delivered O2 Flow Rate:    Intake/output summary:  Intake/Output Summary (Last 24 hours) at 06/28/16 0911 Last data filed at 06/28/16 0315  Gross per 24 hour  Intake              900 ml  Output              750 ml  Net              150 ml   LBM: Last BM Date: 06/26/16 Baseline Weight: Weight: 56.3 kg (124 lb 1.9 oz) Most recent weight: Weight: 59.7 kg (131 lb 9.8 oz)       Palliative Assessment/Data:    Flowsheet Rows   Flowsheet Row Most Recent Value  Intake Tab  Referral Department  Hospitalist  Unit at Time of Referral  Oncology Unit  Palliative Care Primary Diagnosis  Cancer  Date Notified  06/27/16  Palliative Care Type  Return patient Palliative Care  Reason for referral  Clarify Goals of Care, Counsel Regarding Hospice  Date of Admission  06/26/16  Date first seen by Palliative Care  06/27/16  # of days IP prior to Palliative referral  1  Clinical Assessment  Palliative Performance Scale Score  20%  Pain Max last 24 hours  5  Pain Min Last 24 hours  4  Dyspnea Max Last 24 Hours  4  Dyspnea Min Last 24 hours  3  Nausea Max Last 24 Hours  4  Nausea Min Last 24 Hours  3  Psychosocial & Spiritual Assessment  Palliative Care Outcomes  Patient/Family meeting held?  Yes  Who was at the meeting?  wife Gwyndolyn Saxon over the phone.   Palliative Care Outcomes  Clarified goals of care  Palliative Care follow-up planned  Yes, Facility      Patient Active Problem List   Diagnosis Date Noted  . NSTEMI (non-ST elevated myocardial infarction) ([place]) 06/27/2016  . Cerebrovascular accident (CVA) due to occlusion of cerebral artery ([place]) 06/27/2016  . Encounter for palliative care   . Thrombocytopenia ([place])   . Elevated troponin   . Stroke ([place]) 06/26/2016  . Acute encephalopathy 06/26/2016  . Hyperkalemia 06/26/2016  . IDA (iron deficiency anemia) 06/20/2016  . Lung cancer, primary, with metastasis from lung to other site [facility]) 06/13/2016  . No advance  directive on file 05/16/2016  . Acute ischemic right PCA stroke ([place]) 05/14/2016  . Acute CVA (cerebrovascular accident) ([place]) 05/14/2016  . Malnutrition of moderate degree 05/14/2016  . Pulmonary embolism ([place]) 05/14/2016  . Acute ischemic stroke ([place])   . Lung mass 05/09/2016  . Pulmonary embolism without acute cor pulmonale ([place]) 05/09/2016  . Deep venous thrombosis ([place]) 05/09/2016  . Protein-calorie malnutrition ([place]) 05/09/2016  . Chronic venous insufficiency 04/06/2016  . Decreased appetite 04/06/2016  . Acute renal failure superimposed on stage 3 chronic kidney disease ([place]) 04/06/2016  . Absolute anemia   . Gastrointestinal hemorrhage with melena   . Duodenal ulcer hemorrhagic   . Esophageal ulcer   . Esophageal stricture   . GIB (gastrointestinal bleeding) 01/20/2016  . Thrombocytosis ([place]) 01/20/2016  . Acute kidney injury ([place]) 01/20/2016  . Leukocytosis 01/20/2016  . Ischemic foot ulcer due to atherosclerosis of native artery of limb ([place]) 01/10/2016  . Peripheral vascular disease ([place]) 01/10/2016  . Atherosclerosis of native arteries of the extremities with ulceration ([place]) 01/02/2016  . Benign fibroma of prostate 03/24/2014    Palliative Care Assessment & Plan   Patient Profile:    Assessment:  metastatic poorly differentiated adenocarcinoma of the lung Recent stroke, recurrent strokes hypercoagulability Low platelets.   Recommendations/Plan:   Appreciate Dr Antonieta Pert input. Call placed and discussed with wife, she concurs that the patient does not want further chemo nor would he be able to tolerate it. She is accepting of hospice. She states that her daughter Judson Roch who lives in [place] (  [phone]) would like to speak with Dr Marin Olp directly, she is an Therapist, sports and would like to ask him if there are any different chemotherapy options that could be tried. I have told Mrs Thibault that Dr Marin Olp might be calling her today.   Patient, in my opinion appears  appropriate for residential hospice and full comfort measures. He lives in [place], [place] would have to be contacted by Social work if patient/family are agreeable.   Meanwhile continue current medical therapies for now.    Code Status:    Code Status Orders        Start     Ordered   06/27/16 2820  Do not attempt resuscitation (DNR)  Continuous    Question Answer Comment  In the event of cardiac or respiratory ARREST Do not call a "code blue"   In the event of cardiac or respiratory ARREST Do not perform Intubation, CPR, defibrillation or ACLS   In the event of cardiac or respiratory ARREST Use medication by any route, position, wound care, and other measures to relive pain and suffering. May use oxygen, suction and manual treatment of airway obstruction as needed for comfort.      06/27/16 1456    Code Status History    Date Active Date Inactive Code Status Order ID Comments User Context   06/27/2016  1:48 AM 06/27/2016  2:56 PM Full Code [id number]  Ivor Costa, MD ED   05/14/2016  1:01 AM 05/16/2016  7:49 PM Full Code [id number]  Vianne Bulls, MD ED   01/20/2016  3:51 PM 01/23/2016  2:20 PM Full Code [id number]  Theodis Blaze, MD Inpatient    Advance Directive Documentation   Flowsheet Row Most Recent Value  Type of Advance Directive  Out of facility DNR (pink MOST or yellow form)  Pre-existing out of facility DNR order (yellow form or pink MOST form)  Physician notified to receive inpatient order  "MOST" Form in Place?  No data       Prognosis:   < 2 weeks possibly   Discharge Planning:  Hospice facility seems to be the most appropriate choice. Wife wishes to discuss further with her family.   Care plan was discussed with  Wife over the phone.   Thank you for allowing the Palliative Medicine Team to assist in the care of this patient.   Time In:  845 Time Out: 910 Total Time 25 Prolonged Time Billed  no       Greater than 50%  of this time was  spent counseling and coordinating care related to the above assessment and plan.  Loistine Chance, MD [phone]  Please contact Palliative Medicine Team phone at [phone] for questions and concerns.  Password [email or web address]

## 2016-06-28 NOTE — Progress Notes (Signed)
OT Cancellation Note  Patient Details Name: Henry Ford MRN: [id number] DOB: 04-23-1935   Cancelled Treatment:    Reason Eval/Treat Not Completed: Other (comment) -- Note probable plans for comfort care/hospice. OT will sign off for now. Please reorder if desired. Thank you.  Rikia Sukhu A 06/28/2016, 11:58 AM

## 2016-06-29 ENCOUNTER — Telehealth: Payer: Self-pay

## 2016-06-29 ENCOUNTER — Encounter: Payer: Self-pay | Admitting: Nurse Practitioner

## 2016-06-29 DIAGNOSIS — I639 Cerebral infarction, unspecified: Secondary | ICD-10-CM | POA: Diagnosis present

## 2016-06-29 DIAGNOSIS — I4891 Unspecified atrial fibrillation: Secondary | ICD-10-CM

## 2016-06-29 DIAGNOSIS — I82403 Acute embolism and thrombosis of unspecified deep veins of lower extremity, bilateral: Secondary | ICD-10-CM

## 2016-06-29 LAB — CBC
HCT: 25.3 % — ABNORMAL LOW (ref 39.0–52.0)
HEMOGLOBIN: 8.4 g/dL — AB (ref 13.0–17.0)
MCH: 29.7 pg (ref 26.0–34.0)
MCHC: 33.2 g/dL (ref 30.0–36.0)
MCV: 89.4 fL (ref 78.0–100.0)
PLATELETS: 43 10*3/uL — AB (ref 150–400)
RBC: 2.83 MIL/uL — AB (ref 4.22–5.81)
RDW: 16.2 % — ABNORMAL HIGH (ref 11.5–15.5)
WBC: 4.2 10*3/uL (ref 4.0–10.5)

## 2016-06-29 MED ORDER — APIXABAN 5 MG PO TABS: 5.0000 mg | ORAL_TABLET | Freq: Two times a day (BID) | ORAL | 0 refills | Status: AC

## 2016-06-29 MED ORDER — DILTIAZEM HCL 25 MG/5ML IV SOLN
10.0000 mg | Freq: Once | INTRAVENOUS | Status: AC
  Administered 2016-06-29: 10 mg via INTRAVENOUS
  Filled 2016-06-29: qty 5

## 2016-06-29 MED ORDER — DILTIAZEM HCL 25 MG/5ML IV SOLN
20.0000 mg | INTRAVENOUS | Status: AC
  Administered 2016-06-29: 20 mg via INTRAVENOUS
  Filled 2016-06-29: qty 5

## 2016-06-29 MED ORDER — SODIUM CHLORIDE 0.9 % IV BOLUS (SEPSIS)
500.0000 mL | Freq: Once | INTRAVENOUS | Status: AC
  Administered 2016-06-29: 500 mL via INTRAVENOUS

## 2016-06-29 MED ORDER — METOPROLOL TARTRATE 25 MG PO TABS: 12.5000 mg | ORAL_TABLET | Freq: Two times a day (BID) | ORAL | 0 refills | Status: AC

## 2016-06-29 MED ORDER — SENNOSIDES-DOCUSATE SODIUM 8.6-50 MG PO TABS: 1.0000 | ORAL_TABLET | Freq: Every evening | ORAL | 0 refills | Status: AC | PRN

## 2016-06-29 NOTE — Telephone Encounter (Signed)
Daughter called asking about paperwork that was supposed to be faxed. It is about getting court permission for [place] to leave [facility] b[place]c the brother is under house arrest. He needs to come to [facility] to see his father who is very sick, possibly dying. A voice message was left for [facility] and this note forwarded.

## 2016-06-29 NOTE — Progress Notes (Signed)
Patient is set to discharge to [facility] today. Patient & wife, Gwyndolyn Saxon at bedside aware. Discharge packet given to RN, Joellen Jersey. [facility] called for transport.     Raynaldo Opitz, [facility] Clinical Social Worker cell #: [phone]

## 2016-06-29 NOTE — Discharge Summary (Signed)
Physician Discharge Summary  Henry Ford HER:[id number] DOB: 1935-07-24 DOA: 06/26/2016  PCP: [name], DO  Admit date: 06/26/2016 Discharge date: 06/29/2016  Admitted From: Home Disposition: Hospice [place]  Recommendations for Outpatient Follow-up:  1. Follow up with M.D. at hospice in 1 week 2.   Home Health: None Equipment/Devices: None  Discharge Condition: Guarded/hospice CODE STATUS: DO NOT RESUSCITATE Diet recommendation: Regular /comfort feed   Discharge Diagnoses:  Principal Problem:   Stroke, acute, embolic (HCC)   Active Problems:   Acute renal failure superimposed on stage 3 chronic kidney disease (HCC)   Deep venous thrombosis (HCC)   Protein-calorie malnutrition (HCC)   Malnutrition of moderate degree   Pulmonary embolism (HCC)   Lung cancer, primary, with metastasis from lung to other site (HCC)   IDA (iron deficiency anemia)   Stroke (HCC)   Acute encephalopathy   Hyperkalemia   NSTEMI (non-ST elevated myocardial infarction) ([place])   Encounter for palliative care   Thrombocytopenia (HCC)   Elevated troponin   Atrial fibrillation with RVR ([place])  Brief narrative/history of present illness 81 year old male with history of hypertension, hyperlipidemia, recent stroke in June 2017, lung cancer With biopsy on 7/20 showing metastatic moderately to poorly differentiated adenocarcinoma), GI bleed, anemia, BPH, CK D stage III, DVT and PE on Lovenox brought to the ED with agitation and altered mental status. In the ED he was found to have a lactate of 2. troponin of 0.13 which further elevated to 0.3, BNP of 337 and potassium of 5.8. He'll function mildly worsened. CT head showed new right frontal lobe infarct (subacute), without intracranial hemorrhage.  Admitted to telemetry for further management.  Hospital course Principal Problem:   Stroke [facility]) Suspect embolic. Agitation and confusion likely related to new right frontal lobe infarct. Now  resolved. Neuro hospitalist Dr. Nicole Kindred was consulted by ED physician who recommended no further workup since his recent aggressive workup done. Given overall decline in his health condition palliative care was consulted. Seen by his oncologist and recommend that he has very poor overall prognosis with poor performance status and is not a candidate for further chemotherapy.  recommends hospice. Palliative care team spoke with his wife who agreed with sending him to residential hospice for comfort.    Active Problems: Thrombocytopenia ([place]) Patient was started on Lovenox after his stroke in June and recently diagnosed bilateral DVT and PE (decided to switch him to Lovenox as patient was already on eliquis) Significant drop in platelets likely due to chemotherapy. After evaluation was discontinued but wife and daughter request that he should be on it. This was okayed by his oncologist. I would place him back on Eliquis.     Acute renal failure superimposed on stage 3 chronic kidney disease (HCC) Given gentle hydration.  Elevated troponin/ ? NSTEMI Denies chest pain symptoms. However 2-D echo with decreased EF of 45-50% with diffuse hypokinesis. This is new from his echo 2 months back. Grade 1 diastolic dysfunction. Currently euvolemic. Added low dose beta blocker.  A. fib with RVR on 8/11 Transient symptoms responded to IV Cardizem portion. Now converted to sinus rhythm. We discharged him on low-dose beta blocker.     Deep venous thrombosis (HCC) and PE DC anticoagulation.    Protein-calorie malnutrition ([place]) Comfort feeds.     Lung cancer, primary, with metastasis from lung to other site [facility]) Poor functional status and not a candidate for further chemotherapy. Oncologist recommends hospice.    Hyperkalemia Resolved. Stable on telemetry.  Patient will be discharged to his usual hospice. Wife requests that his home medications be continued and to be sorted out at  the hospice facility depending upon his progress.        Family Communication  :  Spoke with wife on the phone  Disposition Plan  :  Residential hospice    Consults  :   Palliative care Oncology (Dr Marin Olp)  Procedures  : head CT   Discharge Instructions     Medication List    STOP taking these medications   ciprofloxacin 500 MG tablet Commonly known as:  CIPRO   enoxaparin 120 MG/0.8ML injection Commonly known as:  LOVENOX   fluconazole 100 MG tablet Commonly known as:  DIFLUCAN     TAKE these medications   apixaban 5 MG Tabs tablet Commonly known as:  ELIQUIS Take 1 tablet (5 mg total) by mouth 2 (two) times daily.   atorvastatin 10 MG tablet Commonly known as:  LIPITOR Take 1 tablet (10 mg total) by mouth daily at 6 PM.   co-enzyme Q-10 30 MG capsule Take 30 mg by mouth daily.   finasteride 5 MG tablet Commonly known as:  PROSCAR Take 5 mg by mouth daily. Reported on 02/01/2016   FLUoxetine 20 MG tablet Commonly known as:  PROZAC Take 1 tablet (20 mg total) by mouth daily.   folic acid 1 MG tablet Commonly known as:  FOLVITE Take 1 tablet (1 mg total) by mouth daily. Start 5-7 days before Alimta chemotherapy. Continue until 21 days after Alimta completed.   metoprolol tartrate 25 MG tablet Commonly known as:  LOPRESSOR Take 0.5 tablets (12.5 mg total) by mouth 2 (two) times daily.   multivitamin tablet Take 1 tablet by mouth daily.   Omega 3 1000 MG Caps Take 1,000 mg by mouth daily.   ondansetron 8 MG tablet Commonly known as:  ZOFRAN Take 1 tablet (8 mg total) by mouth 2 (two) times daily as needed (Nausea or vomiting).   pantoprazole 40 MG tablet Commonly known as:  PROTONIX Take 40 mg by mouth daily.   predniSONE 20 MG tablet Commonly known as:  DELTASONE Take 1 tablet (20 mg total) by mouth daily with breakfast.   Red Yeast Rice 600 MG Caps Take 600 mg by mouth daily.   senna-docusate 8.6-50 MG  tablet Commonly known as:  Senokot-S Take 1 tablet by mouth at bedtime as needed for mild constipation.   terazosin 10 MG capsule Commonly known as:  HYTRIN Take 10 mg by mouth at bedtime. Reported on 01/20/2016   traZODone 50 MG tablet Commonly known as:  DESYREL Take 1 tablet (50 mg total) by mouth at bedtime.   vitamin C 500 MG tablet Commonly known as:  ASCORBIC ACID Take 1,000 mg by mouth daily.       Allergies  Allergen Reactions  . Aspirin Other (See Comments)    Acid reflux      Procedures/Studies: Ct Head Wo Contrast  Addendum Date: 06/26/2016   ADDENDUM REPORT: 06/26/2016 20:28 ADDENDUM: The original report was by Dr. Van Clines. The following addendum is by Dr. Van Clines: These results were called by telephone at the time of interpretation on 06/26/2016 at 8:25 pm to Dr. Duffy Bruce , who verbally acknowledged these results. Electronically Signed   By: Van Clines M.D.   On: 06/26/2016 20:28   Result Date: 06/26/2016 CLINICAL DATA:  Altered mental status. Confusion over the past 2 days, progressive. MRI from 05/14/2016 showed a  large acute right posterior cerebral artery distribution infarct and small acute infarcts in both lobes of the cerebellum, the left occipital lobe, and both frontal lobes (suggesting embolic phenomenon). Recent PET-CT of 05/25/2016 showed metastatic disease in both lungs. EXAM: CT HEAD WITHOUT CONTRAST TECHNIQUE: Contiguous axial images were obtained from the base of the skull through the vertex without intravenous contrast. COMPARISON:  05/14/2016 FINDINGS: Encephalomalacia associated with the prior right PCA distribution infarct noted. There is a new moderate-sized infarct in the right frontal lobe, with area of encephalomalacia measuring about 2.8 by 3.6 by 3.0 cm on image 15/2. Prior areas of cerebellar infarcts are not well seen on today' s CT but were well shown on prior MRI. No intracranial hemorrhage or obvious intracranial  mass lesion. IMPRESSION: 1. New right frontal lobe infarct, probably 78-day-old or older (subacute), measuring up to 3.6 cm in long axis. This was not present on the prior brain MRI of 05/14/2016. No intracranial hemorrhage. 2. Evolutionary findings in the older dominant infarcts which were acute back on 05/14/2016, including a right PCA distribution infarct. The pattern of acute infarcts on the prior MRI suggested in embolic source. 3. Although the patient has bilateral pulmonary masses compatible with metastatic disease, I do not see definite metastatic disease to the brain on today's noncontrast CT. Electronically Signed: By: Van Clines M.D. On: 06/26/2016 20:20   US Biopsy  Result Date: 06/07/2016 INDICATION: Right supraclavicular adenopathy EXAM: ULTRASOUND-GUIDED BIOPSY OF A RIGHT SUPRACLAVICULAR LYMPH NODE. CORE. MEDICATIONS: None. ANESTHESIA/SEDATION: Fentanyl 50 mcg IV; Versed 1 mg IV Moderate Sedation Time:  19 The patient was continuously monitored during the procedure by the interventional radiology nurse under my direct supervision. FLUOROSCOPY TIME:  Fluoroscopy Time:  minutes  seconds ( mGy). COMPLICATIONS: None immediate. PROCEDURE: Informed written consent was obtained from the patient after a thorough discussion of the procedural risks, benefits and alternatives. All questions were addressed. Maximal Sterile Barrier Technique was utilized including caps, mask, sterile gowns, sterile gloves, sterile drape, hand hygiene and skin antiseptic. A timeout was performed prior to the initiation of the procedure. The right neck was prepped with ChloraPrep in a sterile fashion, and a sterile drape was applied covering the operative field. A sterile gown and sterile gloves were used for the procedure. Under sonographic guidance, 3 18 gauge core biopsies of the right supraclavicular lymph node were obtained. Final imaging was performed. Patient tolerated the procedure well without complication. Vital  sign monitoring by nursing staff during the procedure will continue as patient is in the special procedures unit for post procedure observation. FINDINGS: The images document guide needle placement within the enlarged right supraclavicular lymph node. Post biopsy images demonstrate no hemorrhage. IMPRESSION: Successful ultrasound-guided core biopsy of an enlarged right supraclavicular lymph node. Electronically Signed   By: Marybelle Killings M.D.   On: 06/07/2016 14:56    2-D echo (06/27/2016) Study Conclusions  - Left ventricle: The cavity size was mildly dilated. There was   mild concentric hypertrophy. Systolic function was mildly   reduced. The estimated ejection fraction was in the range of 45%   to 50%. Diffuse hypokinesis. Doppler parameters are consistent   with abnormal left ventricular relaxation (grade 1 diastolic   dysfunction). Doppler parameters are consistent with high   ventricular filling pressure. - Aortic valve: Transvalvular velocity was within the normal range.   There was no stenosis. There was mild regurgitation. - Mitral valve: Transvalvular velocity was within the normal range.   There was no evidence for  stenosis. There was trivial   regurgitation. - Left atrium: The atrium was moderately dilated. - Right ventricle: The cavity size was normal. Wall thickness was   normal. Systolic function was normal. - Right atrium: The atrium was moderately dilated. - Tricuspid valve: There was mild regurgitation. - Pulmonary arteries: Systolic pressure was moderately increased.   PA peak pressure: 50 mm Hg (S). - Pericardium, extracardiac: A trivial pericardial effusion was   identified.    Subjective:   Discharge Exam: Vitals:   06/29/16 1124 06/29/16 1245  BP: (!) 109/41 (!) 139/39  Pulse:    Resp:    Temp:     Vitals:   06/29/16 1019 06/29/16 1113 06/29/16 1124 06/29/16 1245  BP: 100/60 115/68 (!) 109/41 (!) 139/39  Pulse:      Resp:      Temp:      TempSrc:       SpO2:      Weight:      Height:        Gen:Not in distress HEENT: , moist mucosa, supple neck Chest: clear b/l, no added sounds CVS: N S1&S2, no murmurs, rubs or gallop GI: soft, NT, ND, BS+ Musculoskeletal: warm, no edema CNS: Alert and oriented x2-3    The results of significant diagnostics from this hospitalization (including imaging, microbiology, ancillary and laboratory) are listed below for reference.     Microbiology: No results found for this or any previous visit (from the past 240 hour(s)).   Labs: BNP (last 3 results)  Recent Labs  06/27/16 0312  BNP 536.6*   Basic Metabolic Panel:  Recent Labs Lab 06/26/16 1950 06/26/16 1959 06/27/16 0032 06/28/16 0429  NA 140 140 140 140  K 6.0* 5.8* 4.1 3.4*  CL 107 105 103 108  CO2 25  --   --  27  GLUCOSE 125* 117* 100* 80  BUN 42* 36* 31* 19  CREATININE 1.65* 1.50* 1.20 1.21  CALCIUM 8.9  --   --  7.8*   Liver Function Tests:  Recent Labs Lab 06/26/16 1950  AST 50*  ALT 80*  ALKPHOS 66  BILITOT 0.7  PROT 6.3*  ALBUMIN 3.1*   No results for input(s): LIPASE, AMYLASE in the last 168 hours. No results for input(s): AMMONIA in the last 168 hours. CBC:  Recent Labs Lab 06/26/16 1950 06/26/16 1959 06/27/16 0032 06/27/16 4403 06/28/16 0429 06/29/16 0443  WBC 8.3  --   --  8.2 4.3 4.2  NEUTROABS 7.0  --   --   --   --   --   HGB 9.4* 9.2* 7.8* 8.7* 8.2* 8.4*  HCT 29.1* 27.0* 23.0* 27.4* 24.9* 25.3*  MCV 90.9  --   --  91.6 89.9 89.4  PLT 92*  --   --  74* 60* 43*   Cardiac Enzymes:  Recent Labs Lab 06/27/16 0022 06/27/16 0613 06/27/16 1203  TROPONINI 0.30* 0.34* 0.32*   BNP: Invalid input(s): POCBNP CBG: No results for input(s): GLUCAP in the last 168 hours. D-Dimer No results for input(s): DDIMER in the last 72 hours. Hgb A1c No results for input(s): HGBA1C in the last 72 hours. Lipid Profile No results for input(s): CHOL, HDL, LDLCALC, TRIG, CHOLHDL, LDLDIRECT in the last  72 hours. Thyroid function studies No results for input(s): TSH, T4TOTAL, T3FREE, THYROIDAB in the last 72 hours.  Invalid input(s): FREET3 Anemia work up No results for input(s): VITAMINB12, FOLATE, FERRITIN, TIBC, IRON, RETICCTPCT in the last 72 hours. Urinalysis  Component Value Date/Time   COLORURINE YELLOW 06/27/2016 0652   APPEARANCEUR CLOUDY (A) 06/27/2016 0652   LABSPEC 1.019 06/27/2016 0652   PHURINE 7.0 06/27/2016 0652   GLUCOSEU NEGATIVE 06/27/2016 0652   HGBUR MODERATE (A) 06/27/2016 0652   BILIRUBINUR NEGATIVE 06/27/2016 0652   KETONESUR NEGATIVE 06/27/2016 0652   PROTEINUR NEGATIVE 06/27/2016 0652   NITRITE NEGATIVE 06/27/2016 0652   LEUKOCYTESUR LARGE (A) 06/27/2016 0652   Sepsis Labs Invalid input(s): PROCALCITONIN,  WBC,  LACTICIDVEN Microbiology No results found for this or any previous visit (from the past 240 hour(s)).   Time coordinating discharge: Over 30 minutes  SIGNED:   Louellen Molder, MD  [facility] 06/29/2016, 1:22 PM Pager   If 7PM-7AM, please contact night-coverage [email or web address] Password [email or web address]

## 2016-06-29 NOTE — Progress Notes (Signed)
Daily Progress Note   Patient Name: Henry Ford       Date: 06/29/2016 DOB: 1935-04-17  Age: 81 y.o. MRN#: [id number] Attending Physician: Louellen Molder, MD Primary Care Physician: [name], DO Admit Date: 06/26/2016  Reason for Consultation/Follow-up: Establishing goals of care   Life limiting illness: metastatic poorly differentiated adenocarcinoma of the lung, now admitted with recurrent strokes, low platelets, declining functional status, unable to tolerate chemo.   Subjective: Awake alert,  In no distress Eating breakfast   Length of Stay: 2  Current Medications: Scheduled Meds:  . finasteride  5 mg Oral Daily  . FLUoxetine  20 mg Oral Daily  . metoprolol tartrate  12.5 mg Oral BID  . multivitamin with minerals  1 tablet Oral Daily  . omega-3 acid ethyl esters  1,000 mg Oral Daily  . pantoprazole  40 mg Oral Daily  . predniSONE  10 mg Oral Q breakfast  . terazosin  10 mg Oral QHS  . traZODone  50 mg Oral QHS  . vitamin C  1,000 mg Oral Daily    Continuous Infusions:    PRN Meds: morphine injection, nitroGLYCERIN, ondansetron, senna-docusate  Physical Exam         Weak frail appearing elderly gentleman No distress Appears chronically ill No edema Muscle wasting evident Shallow regular breathing pattern noted.   Vital Signs: BP (!) 146/52 (BP Location: Right Arm)   Pulse 66   Temp 98.6 F (37 C) (Oral)   Resp 18   Ht '5\' 9"'$  (1.753 m)   Wt 52.7 kg (116 lb 2.9 oz)   SpO2 98%   BMI 17.16 kg/m  SpO2: SpO2: 98 % O2 Device: O2 Device: Not Delivered O2 Flow Rate:    Intake/output summary:   Intake/Output Summary (Last 24 hours) at 06/29/16 0925 Last data filed at 06/29/16 0505  Gross per 24 hour  Intake                0 ml  Output              1975 ml  Net            -1975 ml   LBM: Last BM Date:  (unknown Pt with confusion No family) Baseline Weight: Weight: 56.3 kg (124 lb 1.9 oz) Most recent weight: Weight: 52.7  kg (116 lb 2.9 oz)       Palliative Assessment/Data:    Flowsheet Rows   Flowsheet Row Most Recent Value  Intake Tab  Referral Department  Hospitalist  Unit at Time of Referral  Oncology Unit  Palliative Care Primary Diagnosis  Cancer  Date Notified  06/27/16  Palliative Care Type  Return patient Palliative Care  Reason for referral  Clarify Goals of Care, Counsel Regarding Hospice  Date of Admission  06/26/16  Date first seen by Palliative Care  06/27/16  # of days IP prior to Palliative referral  1  Clinical Assessment  Palliative Performance Scale Score  20%  Pain Max last 24 hours  5  Pain Min Last 24 hours  4  Dyspnea Max Last 24 Hours  4  Dyspnea Min Last 24 hours  3  Nausea Max Last 24 Hours  4  Nausea Min Last 24 Hours  3  Psychosocial & Spiritual Assessment  Palliative Care Outcomes  Patient/Family meeting held?  Yes  Who was at the meeting?  wife Gwyndolyn Saxon over the phone.   Palliative Care Outcomes  Clarified goals of care  Palliative Care follow-up planned  Yes, Facility      Patient Active Problem List   Diagnosis Date Noted  . NSTEMI (non-ST elevated myocardial infarction) ([place]) 06/27/2016  . Cerebrovascular accident (CVA) due to occlusion of cerebral artery ([place]) 06/27/2016  . Encounter for palliative care   . Thrombocytopenia ([place])   . Elevated troponin   . Stroke ([place]) 06/26/2016  . Acute encephalopathy 06/26/2016  . Hyperkalemia 06/26/2016  . IDA (iron deficiency anemia) 06/20/2016  . Lung cancer, primary, with metastasis from lung to other site [facility]) 06/13/2016  . No advance directive on file 05/16/2016  . Acute ischemic right PCA stroke ([place]) 05/14/2016  . Acute CVA (cerebrovascular accident) ([place]) 05/14/2016  . Malnutrition of moderate degree 05/14/2016  . Pulmonary  embolism ([place]) 05/14/2016  . Acute ischemic stroke ([place])   . Lung mass 05/09/2016  . Pulmonary embolism without acute cor pulmonale ([place]) 05/09/2016  . Deep venous thrombosis ([place]) 05/09/2016  . Protein-calorie malnutrition ([place]) 05/09/2016  . Chronic venous insufficiency 04/06/2016  . Decreased appetite 04/06/2016  . Acute renal failure superimposed on stage 3 chronic kidney disease ([place]) 04/06/2016  . Absolute anemia   . Gastrointestinal hemorrhage with melena   . Duodenal ulcer hemorrhagic   . Esophageal ulcer   . Esophageal stricture   . GIB (gastrointestinal bleeding) 01/20/2016  . Thrombocytosis ([place]) 01/20/2016  . Acute kidney injury ([place]) 01/20/2016  . Leukocytosis 01/20/2016  . Ischemic foot ulcer due to atherosclerosis of native artery of limb ([place]) 01/10/2016  . Peripheral vascular disease ([place]) 01/10/2016  . Atherosclerosis of native arteries of the extremities with ulceration ([place]) 01/02/2016  . Benign fibroma of prostate 03/24/2014    Palliative Care Assessment & Plan   Patient Profile:    Assessment:  metastatic poorly differentiated adenocarcinoma of the lung Recent stroke, recurrent strokes hypercoagulability Low platelets.   Recommendations/Plan: Agree with residential hospice on d/c SW following.  No additional recommendations at this time.      Code Status:    Code Status Orders        Start     Ordered   06/27/16 1610  Do not attempt resuscitation (DNR)  Continuous    Question Answer Comment  In the event of cardiac or respiratory ARREST Do not call a "code blue"   In the event of cardiac or respiratory  ARREST Do not perform Intubation, CPR, defibrillation or ACLS   In the event of cardiac or respiratory ARREST Use medication by any route, position, wound care, and other measures to relive pain and suffering. May use oxygen, suction and manual treatment of airway obstruction as needed for comfort.      06/27/16 1456    Code Status History      Date Active Date Inactive Code Status Order ID Comments User Context   06/27/2016  1:48 AM 06/27/2016  2:56 PM Full Code [id number]  Ivor Costa, MD ED   05/14/2016  1:01 AM 05/16/2016  7:49 PM Full Code [id number]  Vianne Bulls, MD ED   01/20/2016  3:51 PM 01/23/2016  2:20 PM Full Code [id number]  Theodis Blaze, MD Inpatient    Advance Directive Documentation   Flowsheet Row Most Recent Value  Type of Advance Directive  Out of facility DNR (pink MOST or yellow form)  Pre-existing out of facility DNR order (yellow form or pink MOST form)  Physician notified to receive inpatient order  "MOST" Form in Place?  No data       Prognosis:   few weeks.   Discharge Planning:  Hospice facility seems to be the most appropriate choice.     Care plan was discussed with  Patient, RN.    Thank you for allowing the Palliative Medicine Team to assist in the care of this patient.   Time In:  845 Time Out: 910 Total Time 25 Prolonged Time Billed  no       Greater than 50%  of this time was spent counseling and coordinating care related to the above assessment and plan.  Loistine Chance, MD [phone]  Please contact Palliative Medicine Team phone at [phone] for questions and concerns.  Password [email or web address]

## 2016-06-29 NOTE — Progress Notes (Addendum)
Patient HR sustaining in the 140's-150's while lying in bed.  BP 127/75. Asymptomatic.  MD made aware.  WIll continue to monitor closely and follow out any new orders.   10 mg cardizem given per verbal order.

## 2016-06-29 NOTE — Progress Notes (Addendum)
After giving patient a bath pt HR is now sustaining in the 150's.  MD made aware.  Will give '10mg'$  cardizem per MD order.

## 2016-06-29 NOTE — Progress Notes (Signed)
Letter was faxed to son's attorney Beckie Busing) @ [phone] with received confirmation. Letter was in reference to son being able to leave state of [place] to visit father as he is expected to pass away. Sister Mrs. Koleen Nimrod and wife has been notified that the letter was sent.

## 2016-06-29 NOTE — Progress Notes (Signed)
Report called to Danne [place] at [facility].  All questions answered.  MD aware of patient HR in the 150's, ok for discharge.

## 2016-06-29 NOTE — Progress Notes (Signed)
Henry Ford actually looks better this morning. He is much more conversational. He appears to be alert.  He's had no obvious pain issues. There is no obvious shortness of breath.  His platelet count is still dropping. I have to believe this is from the [place]. It is now 43,000. His hemoglobin is stabilized at 8.4.  He is off anticoagulation. He did have the HITT test done. This was normal. As such I don't think he has heparin-induced thrombocytopenia. He has been on heparin before.  I'm not sure if he was doing the Lovenox at home.  I did speak with his daughter by phone yesterday. She lives in [place]. She will be, try to help out. She is a Marine scientist. I told her that he, from my perspective, will not be getting any further chemotherapy. I just do not believe that chemotherapy will benefit him and there is certain risks.  She really wants him anticoagulated. I think this is reasonable. I think the real question is how we can do this. I think that possibly one of the new oral anticoagulants may not be a bad idea. Again, there is at risk of bleeding because his performance status is not that great.  I am just thankful that he sees me feeling better. I talked him about hospice. He understands what hospice is about.   I told him that I thought it would be a good idea to get him into a residential hospice facility. It seems like they are working on this. I don't think this be a bad idea.  It is hard to say how much he is really eating. He is having no nausea or vomiting.  On his physical exam, his vital signs look pretty good. His blood pressure is 146/52. He is afebrile. His lungs are clear. He has some scattered rhonchi. Head negative exam shows no scleral icterus. He has no adenopathy. Cardiac exam regular rate and rhythm. Abdomen is soft. There is no guarding or rebound tenderness. Extremities shows muscle atrophy in the upper and lower extremities. Neurological exam shows no obvious neurological  deficits.  Henry Ford has metastatic non-small cell lung cancer. He had 1 cycle of dose reduced chemotherapy which he just did not tolerate well. there is absolutely no indication for any additional chemotherapy. 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  He understands all this. He just wants quality of life. I told him we with this. I think hospice will be a great intervention in great resource that we can use to help he and his family out.  I really think that his prognosis is probably less than 6 weeks. I worry that he will have another thromboembolic event as he is incredibly hypercoagulable from his malignancy. Trying to get him on anticoagulation would not be about idea to try to minimize the risk of another thrombotic episode.  As always, the staff up on [address] is doing a tremendous job. I very much appreciate their compassion and their expertise in helping Henry Ford and his family out.   Henry Haw, MD  Romans 8:28

## 2016-07-05 ENCOUNTER — Encounter: Payer: Commercial Managed Care - HMO | Admitting: Osteopathic Medicine

## 2016-07-05 ENCOUNTER — Telehealth: Payer: Self-pay | Admitting: *Deleted

## 2016-07-05 NOTE — Telephone Encounter (Signed)
Received notice that patient deceased on 2016-07-11 at 02/04/21.  Dr. Marin Olp notified

## 2016-07-11 ENCOUNTER — Other Ambulatory Visit: Payer: Commercial Managed Care - HMO

## 2016-07-11 ENCOUNTER — Ambulatory Visit: Payer: Commercial Managed Care - HMO | Admitting: Hematology & Oncology

## 2016-07-11 ENCOUNTER — Ambulatory Visit: Payer: Commercial Managed Care - HMO

## 2016-07-13 ENCOUNTER — Ambulatory Visit: Payer: Commercial Managed Care - HMO | Admitting: Emergency Medicine

## 2016-07-17 ENCOUNTER — Ambulatory Visit: Payer: Commercial Managed Care - HMO | Admitting: Emergency Medicine

## 2016-07-18 ENCOUNTER — Ambulatory Visit: Payer: Self-pay | Admitting: Neurology

## 2016-07-20 DEATH — deceased

## 2016-07-24 ENCOUNTER — Encounter: Payer: Self-pay | Admitting: Hematology & Oncology

## 2016-08-01 ENCOUNTER — Ambulatory Visit: Payer: Commercial Managed Care - HMO

## 2016-08-01 ENCOUNTER — Other Ambulatory Visit: Payer: Commercial Managed Care - HMO

## 2016-08-22 ENCOUNTER — Other Ambulatory Visit: Payer: Commercial Managed Care - HMO

## 2016-08-22 ENCOUNTER — Ambulatory Visit: Payer: Commercial Managed Care - HMO | Admitting: Hematology & Oncology

## 2016-08-22 ENCOUNTER — Ambulatory Visit: Payer: Commercial Managed Care - HMO

## 2016-08-29 ENCOUNTER — Encounter (HOSPITAL_COMMUNITY): Payer: Commercial Managed Care - HMO

## 2016-08-29 ENCOUNTER — Ambulatory Visit: Payer: Commercial Managed Care - HMO | Admitting: Vascular Surgery

## 2016-09-12 ENCOUNTER — Other Ambulatory Visit: Payer: Commercial Managed Care - HMO

## 2016-09-12 ENCOUNTER — Ambulatory Visit: Payer: Commercial Managed Care - HMO

## 2016-10-27 ENCOUNTER — Other Ambulatory Visit: Payer: Self-pay | Admitting: Nurse Practitioner

## 2017-05-26 IMAGING — MR MR MRA HEAD W/O CM
11 of 14 series · 28 of 48 positions shown · IV contrast (Yes MH)
Comparison: CT HEAD May 13, 2016

CLINICAL DATA: Confusion, LEFT vision changes. Follow-up acute
stroke. History of locally metastatic bronchogenic carcinoma,
hypercoagulable state, hypertension.

EXAM:
MRI HEAD WITHOUT AND WITH CONTRAST
MRA HEAD WITHOUT CONTRAST
TECHNIQUE: Multiplanar, multiecho pulse sequences of the brain and surrounding
structures were obtained without and with intravenous contrast.
Angiographic images of the head were obtained using MRA technique
without contrast.
CONTRAST:  15mL MULTIHANCE GADOBENATE DIMEGLUMINE 529 MG/ML IV SOLN

[Series 3: DWI · axial · 3.0mm · 0.94mm/px · z∈[-75,+71]mm · 6 of 99 slices shown (1 of 2)]
[im 1/99]
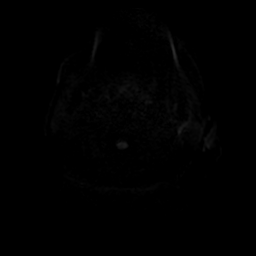
[im 20/99]
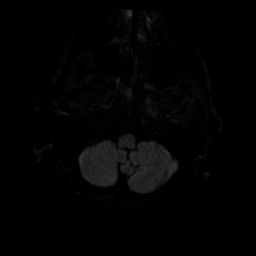
[im 40/99]
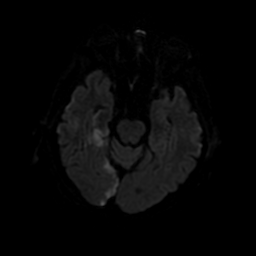
[im 59/99]
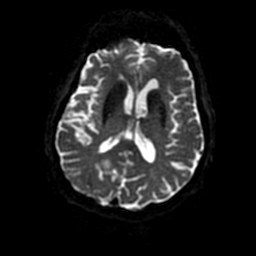
[im 79/99]
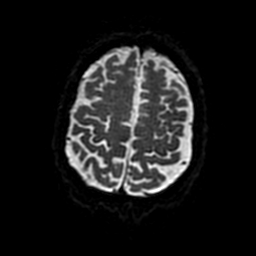
[im 99/99]
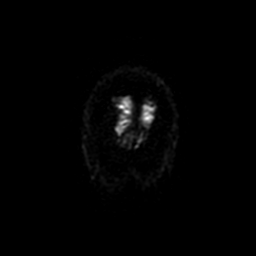

[Series 4: FLAIR · sagittal · 5.0mm · 0.47mm/px · 1 of 23 slices shown (1 of 2)]
[im 1/23]
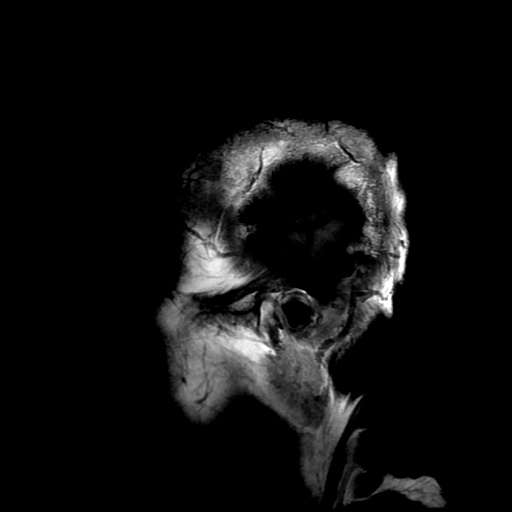

[Series 5: ax (id) 2 · axial · 1.0mm · 0.43mm/px · z∈[-58,-31]mm · 3 of 182 slices shown]
[im 1/182]
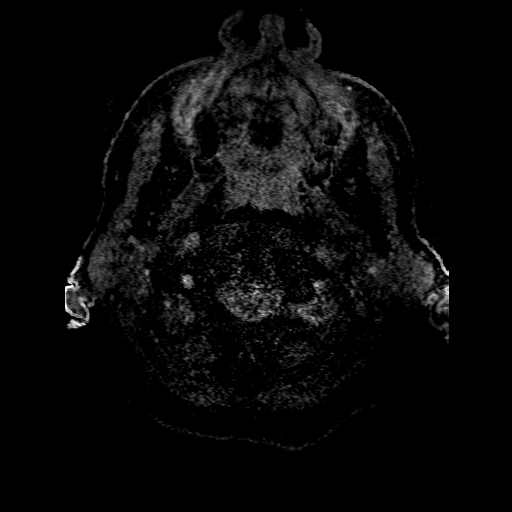
[im 37/182]
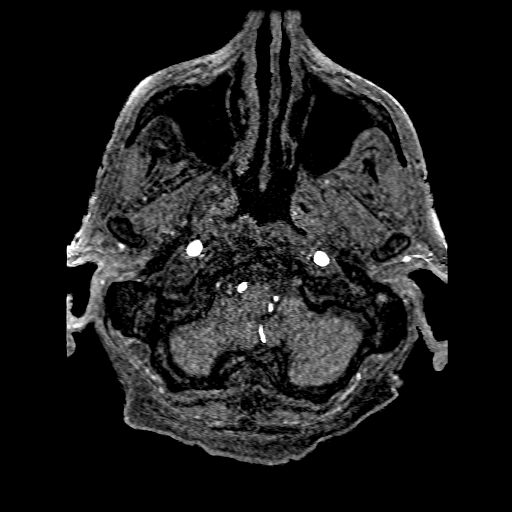
[im 55/182]
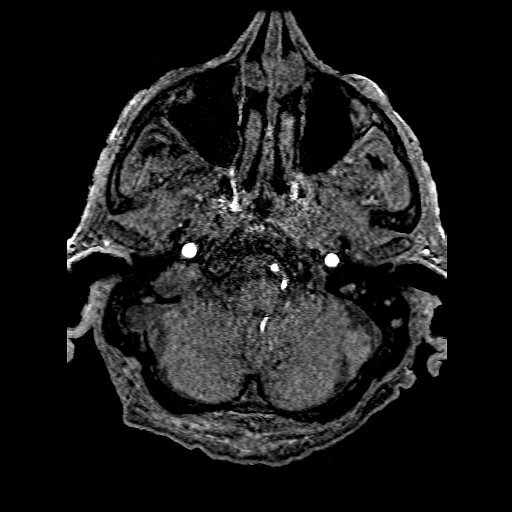

[Series 7: T2 · axial · 5.0mm · 0.47mm/px · z∈[-79,+76]mm · 2 of 27 slices shown]
[im 1/27]
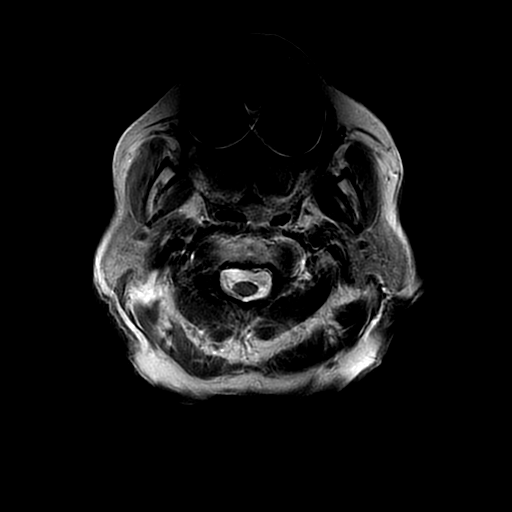
[im 27/27]
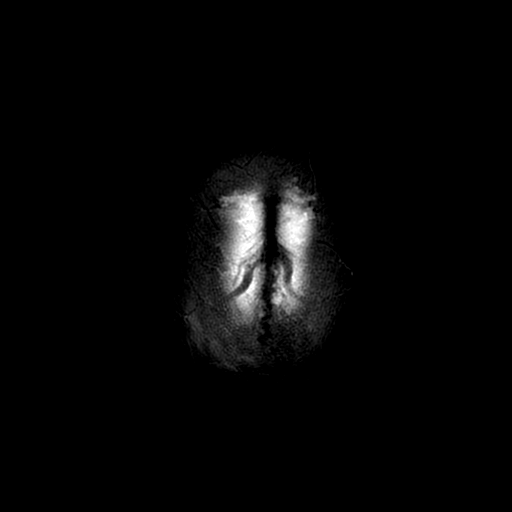

[Series 8: FLAIR · axial · 5.0mm · 0.47mm/px · z∈[-79,+76]mm · 2 of 27 slices shown (2 of 2)]
[im 1/27]
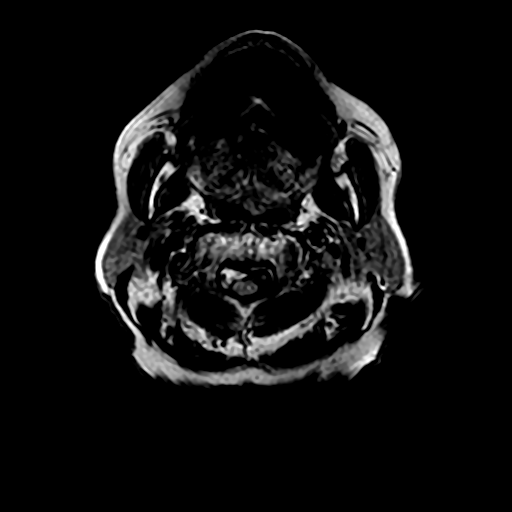
[im 27/27]
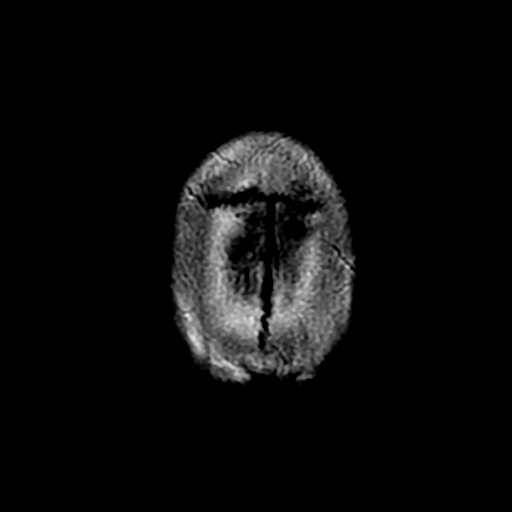

[Series 11: DWI · coronal · 4.0mm · 0.94mm/px · 4 of 70 slices shown (2 of 2)]
[im 1/70]
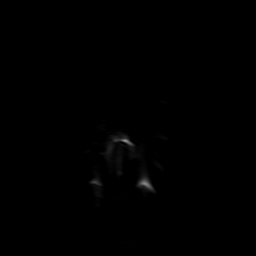
[im 24/70]
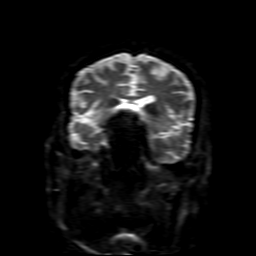
[im 47/70]
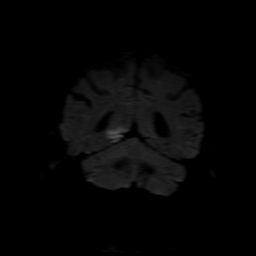
[im 70/70]
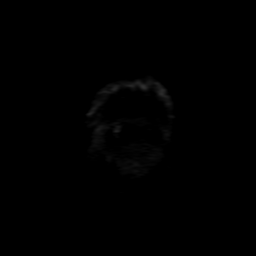

[Series 12: T2 post-contrast · coronal · 5.0mm · 0.39mm/px · 2 of 27 slices shown]
[im 1/27]
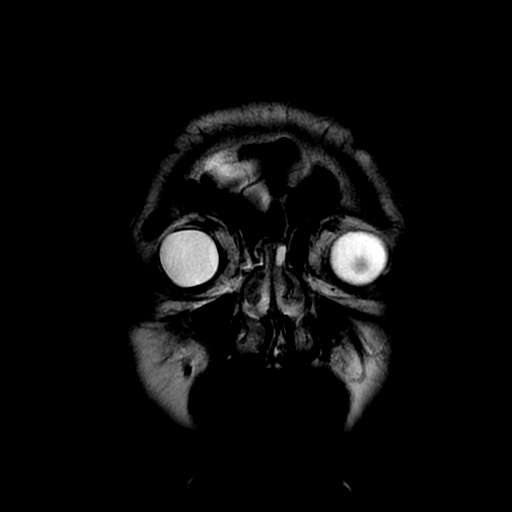
[im 27/27]
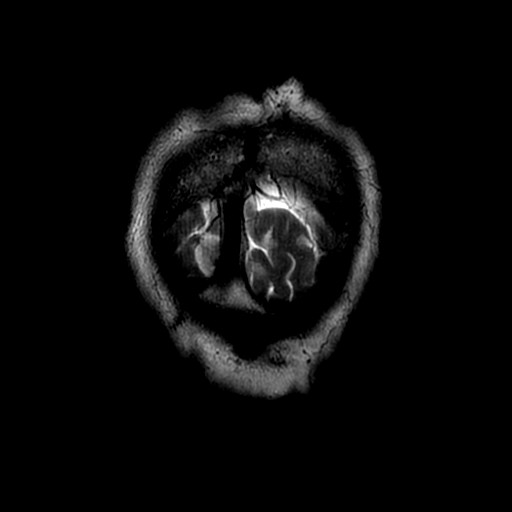

[Series 14: T1 · coronal · 5.0mm · 0.39mm/px · 2 of 27 slices shown]
[im 1/27]
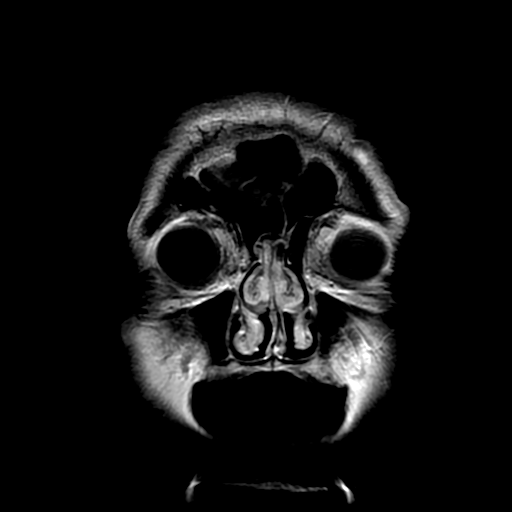
[im 27/27]
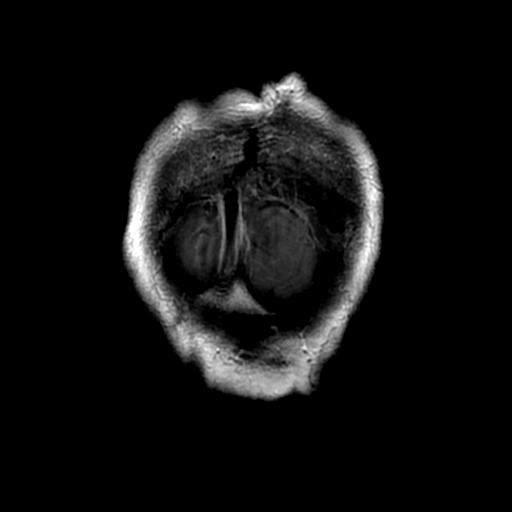

[Series 15: FLAIR post-contrast · sagittal · 5.0mm · 0.47mm/px · 1 of 23 slices shown]
[im 1/23]
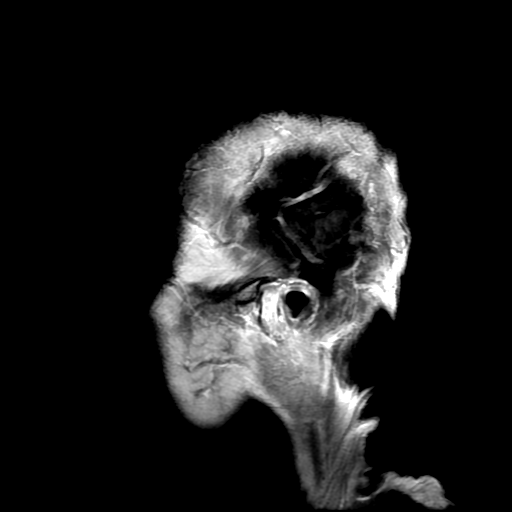

[Series 350: ADC · axial · 3.0mm · 0.94mm/px · z∈[-75,+71]mm · 3 of 50 slices shown (1 of 2)]
[im 1/50]
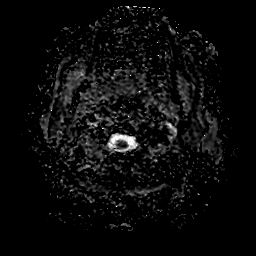
[im 25/50]
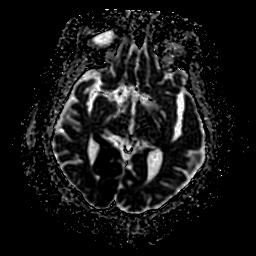
[im 50/50]
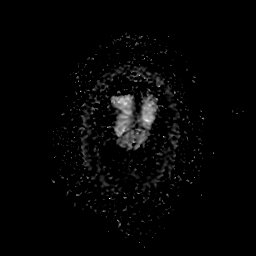

[Series 1150: ADC · coronal · 4.0mm · 0.94mm/px · 2 of 36 slices shown (2 of 2)]
[im 1/36]
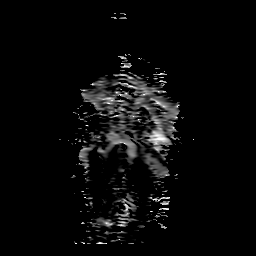
[im 36/36]
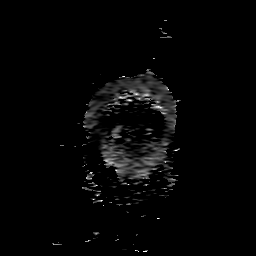

[28 of 48 positions shown; findings below may reference images not displayed]

FINDINGS: MRI HEAD FINDINGS

INTRACRANIAL CONTENTS: Confluent medial RIGHT temporal occipital
reduced diffusion with low ADC values and intermediate to bright T2
FLAIR signal. Scattered subcentimeter foci of reduced diffusion LEFT
occipital lobe, bilateral cerebellum in addition to 16 x 9 mm focus
of reduced diffusion RIGHT cerebellum with low ADC value. At least 2
subcentimeter focus of reduced diffusion bifrontal lobes no
susceptibility artifact is suggests intraparenchymal hemorrhage.
Linear reduced diffusion RIGHT frontal extra-axial space with bright
FLAIR signal and intrinsic T1 shortening. Ventricles and sulci are
normal for patient's age. Minimal white matter changes most
compatible with chronic small vessel ischemic disease are less than
expected for age. No intraparenchymal masses, mass effect or
abnormal enhancement.

ORBITS: The included ocular globes and orbital contents are
non-suspicious.

SINUSES: The mastoid air-cells and included paranasal sinuses are
well-aerated.

SKULL/SOFT TISSUES: No abnormal sellar expansion. No suspicious
calvarial bone marrow signal. Generalized bright T1 bone marrow
signal most compatible with osteopenia. Craniocervical junction
maintained. Sub cm probable lymph node RIGHT parotid gland. Patient
is edentulous.

MRA HEAD FINDINGS

ANTERIOR CIRCULATION: Normal flow related enhancement of the
included cervical, petrous, cavernous and supraclinoid internal
carotid arteries. Patent anterior communicating artery. Normal flow
related enhancement of the anterior and middle cerebral arteries,
including distal segments.

No large vessel occlusion, high-grade stenosis, abnormal luminal
irregularity, aneurysm.

POSTERIOR CIRCULATION: RIGHT vertebral artery is dominant. Basilar
artery is patent, with normal flow related enhancement of the main
branch vessels. Robust LEFT and small RIGHT posterior communicating
arteries present. Occluded RIGHT proximal P2 segment.

No high-grade stenosis, abnormal luminal irregularity, aneurysm.
IMPRESSION: MRI HEAD: Acute large RIGHT posterior cerebral artery territory
infarct (RIGHT temporal occipital lobe). Additional small acute
infarcts bilateral cerebella, LEFT occipital lobe and bifrontal
lobes. Findings most consistent with embolic phenomena.

Trace subacute RIGHT frontal subdural hematoma.

No MR findings of intracranial metastasis.

MRA HEAD: RIGHT posterior cerebral artery occlusion (proximal P2
segment).

Otherwise negative MRA head.

These results will be called to the ordering clinician or
representative by the Radiologist Assistant, and communication
documented in the PACS or zVision Dashboard.
